# Patient Record
Sex: Female | Born: 1935 | Race: White | Hispanic: No | Smoking: Former smoker
Health system: Southern US, Community
[De-identification: ages and names within clinical notes are randomized; demographics above are authoritative.]

## PROBLEM LIST (undated history)

## (undated) DIAGNOSIS — I4891 Unspecified atrial fibrillation: Principal | ICD-10-CM

## (undated) DIAGNOSIS — I2510 Atherosclerotic heart disease of native coronary artery without angina pectoris: Secondary | ICD-10-CM

## (undated) DIAGNOSIS — J449 Chronic obstructive pulmonary disease, unspecified: Secondary | ICD-10-CM

## (undated) DIAGNOSIS — I219 Acute myocardial infarction, unspecified: Secondary | ICD-10-CM

## (undated) DIAGNOSIS — E785 Hyperlipidemia, unspecified: Secondary | ICD-10-CM

## (undated) DIAGNOSIS — J189 Pneumonia, unspecified organism: Secondary | ICD-10-CM

## (undated) DIAGNOSIS — I10 Essential (primary) hypertension: Secondary | ICD-10-CM

## (undated) DIAGNOSIS — I480 Paroxysmal atrial fibrillation: Secondary | ICD-10-CM

## (undated) DIAGNOSIS — F419 Anxiety disorder, unspecified: Secondary | ICD-10-CM

## (undated) DIAGNOSIS — M1990 Unspecified osteoarthritis, unspecified site: Secondary | ICD-10-CM

## (undated) DIAGNOSIS — E119 Type 2 diabetes mellitus without complications: Secondary | ICD-10-CM

## (undated) DIAGNOSIS — I739 Peripheral vascular disease, unspecified: Secondary | ICD-10-CM

## (undated) HISTORY — DX: Hyperlipidemia, unspecified: E78.5

## (undated) HISTORY — PX: CHOLECYSTECTOMY: SHX55

## (undated) HISTORY — DX: Peripheral vascular disease, unspecified: I73.9

## (undated) HISTORY — DX: Paroxysmal atrial fibrillation: I48.0

## (undated) HISTORY — PX: NASAL SINUS SURGERY: SHX719

## (undated) HISTORY — PX: OTHER SURGICAL HISTORY: SHX169

## (undated) HISTORY — PX: EYE SURGERY: SHX253

---

## 1985-08-06 HISTORY — PX: CORONARY ANGIOPLASTY: SHX604

## 1996-11-11 HISTORY — PX: ATRIAL FLUTTER ABLATION: SHX5733

## 1998-03-22 ENCOUNTER — Inpatient Hospital Stay (HOSPITAL_COMMUNITY): Admission: RE | Admit: 1998-03-22 | Discharge: 1998-03-30 | Payer: Self-pay | Admitting: Cardiovascular Disease

## 1998-03-22 HISTORY — PX: CARDIAC CATHETERIZATION: SHX172

## 1998-03-23 HISTORY — PX: CORONARY ARTERY BYPASS GRAFT: SHX141

## 1998-03-25 ENCOUNTER — Encounter: Payer: Self-pay | Admitting: Surgery

## 1998-03-26 ENCOUNTER — Encounter: Payer: Self-pay | Admitting: Surgery

## 1998-09-28 ENCOUNTER — Other Ambulatory Visit: Admission: RE | Admit: 1998-09-28 | Discharge: 1998-09-28 | Payer: Self-pay | Admitting: Internal Medicine

## 2000-06-03 ENCOUNTER — Other Ambulatory Visit: Admission: RE | Admit: 2000-06-03 | Discharge: 2000-06-03 | Payer: Self-pay | Admitting: Internal Medicine

## 2000-09-06 ENCOUNTER — Encounter: Payer: Self-pay | Admitting: Cardiovascular Disease

## 2000-09-06 ENCOUNTER — Inpatient Hospital Stay (HOSPITAL_COMMUNITY): Admission: EM | Admit: 2000-09-06 | Discharge: 2000-09-07 | Payer: Self-pay | Admitting: *Deleted

## 2003-07-23 ENCOUNTER — Encounter: Admission: RE | Admit: 2003-07-23 | Discharge: 2003-07-23 | Payer: Self-pay | Admitting: Internal Medicine

## 2004-03-21 ENCOUNTER — Ambulatory Visit (HOSPITAL_COMMUNITY): Admission: RE | Admit: 2004-03-21 | Discharge: 2004-03-21 | Payer: Self-pay | Admitting: Cardiovascular Disease

## 2004-03-21 DIAGNOSIS — I739 Peripheral vascular disease, unspecified: Secondary | ICD-10-CM

## 2004-03-21 HISTORY — DX: Peripheral vascular disease, unspecified: I73.9

## 2007-09-15 DIAGNOSIS — I739 Peripheral vascular disease, unspecified: Secondary | ICD-10-CM

## 2007-09-15 HISTORY — DX: Peripheral vascular disease, unspecified: I73.9

## 2008-10-28 ENCOUNTER — Encounter (INDEPENDENT_AMBULATORY_CARE_PROVIDER_SITE_OTHER): Payer: Self-pay | Admitting: Otolaryngology

## 2008-10-28 ENCOUNTER — Ambulatory Visit (HOSPITAL_COMMUNITY): Admission: RE | Admit: 2008-10-28 | Discharge: 2008-10-29 | Payer: Self-pay | Admitting: Otolaryngology

## 2009-06-16 HISTORY — PX: DOPPLER ECHOCARDIOGRAPHY: SHX263

## 2009-08-06 ENCOUNTER — Emergency Department (HOSPITAL_BASED_OUTPATIENT_CLINIC_OR_DEPARTMENT_OTHER): Admission: EM | Admit: 2009-08-06 | Discharge: 2009-08-07 | Payer: Self-pay | Admitting: Emergency Medicine

## 2009-08-07 ENCOUNTER — Ambulatory Visit: Payer: Self-pay | Admitting: Diagnostic Radiology

## 2009-08-07 ENCOUNTER — Encounter (INDEPENDENT_AMBULATORY_CARE_PROVIDER_SITE_OTHER): Payer: Self-pay | Admitting: *Deleted

## 2009-08-19 ENCOUNTER — Ambulatory Visit: Payer: Self-pay | Admitting: Obstetrics and Gynecology

## 2009-08-19 ENCOUNTER — Other Ambulatory Visit
Admission: RE | Admit: 2009-08-19 | Discharge: 2009-08-19 | Payer: Self-pay | Source: Home / Self Care | Admitting: Obstetrics and Gynecology

## 2009-09-19 ENCOUNTER — Encounter: Admission: RE | Admit: 2009-09-19 | Discharge: 2009-09-19 | Payer: Self-pay | Admitting: Internal Medicine

## 2009-09-19 ENCOUNTER — Encounter (INDEPENDENT_AMBULATORY_CARE_PROVIDER_SITE_OTHER): Payer: Self-pay | Admitting: *Deleted

## 2009-09-30 ENCOUNTER — Encounter (INDEPENDENT_AMBULATORY_CARE_PROVIDER_SITE_OTHER): Payer: Self-pay | Admitting: *Deleted

## 2009-09-30 ENCOUNTER — Encounter: Admission: RE | Admit: 2009-09-30 | Discharge: 2009-09-30 | Payer: Self-pay | Admitting: Internal Medicine

## 2009-11-04 DIAGNOSIS — I10 Essential (primary) hypertension: Secondary | ICD-10-CM

## 2009-11-04 HISTORY — DX: Essential (primary) hypertension: I10

## 2009-11-04 HISTORY — PX: OTHER SURGICAL HISTORY: SHX169

## 2009-11-23 ENCOUNTER — Ambulatory Visit: Payer: Self-pay | Admitting: Obstetrics and Gynecology

## 2010-02-10 ENCOUNTER — Encounter (INDEPENDENT_AMBULATORY_CARE_PROVIDER_SITE_OTHER): Payer: Self-pay | Admitting: *Deleted

## 2010-02-14 ENCOUNTER — Encounter (INDEPENDENT_AMBULATORY_CARE_PROVIDER_SITE_OTHER): Payer: Self-pay | Admitting: *Deleted

## 2010-02-15 ENCOUNTER — Encounter: Admission: RE | Admit: 2010-02-15 | Discharge: 2010-02-15 | Payer: Self-pay | Admitting: Family Medicine

## 2010-02-16 ENCOUNTER — Encounter (INDEPENDENT_AMBULATORY_CARE_PROVIDER_SITE_OTHER): Payer: Self-pay | Admitting: *Deleted

## 2010-02-17 ENCOUNTER — Encounter (INDEPENDENT_AMBULATORY_CARE_PROVIDER_SITE_OTHER): Payer: Self-pay | Admitting: *Deleted

## 2010-02-17 DIAGNOSIS — R1013 Epigastric pain: Secondary | ICD-10-CM

## 2010-02-17 DIAGNOSIS — N83209 Unspecified ovarian cyst, unspecified side: Secondary | ICD-10-CM

## 2010-02-17 DIAGNOSIS — J4489 Other specified chronic obstructive pulmonary disease: Secondary | ICD-10-CM | POA: Insufficient documentation

## 2010-02-17 DIAGNOSIS — F329 Major depressive disorder, single episode, unspecified: Secondary | ICD-10-CM

## 2010-02-17 DIAGNOSIS — I10 Essential (primary) hypertension: Secondary | ICD-10-CM | POA: Insufficient documentation

## 2010-02-17 DIAGNOSIS — E119 Type 2 diabetes mellitus without complications: Secondary | ICD-10-CM

## 2010-02-17 DIAGNOSIS — Z8639 Personal history of other endocrine, nutritional and metabolic disease: Secondary | ICD-10-CM

## 2010-02-17 DIAGNOSIS — E278 Other specified disorders of adrenal gland: Secondary | ICD-10-CM | POA: Insufficient documentation

## 2010-02-17 DIAGNOSIS — J449 Chronic obstructive pulmonary disease, unspecified: Secondary | ICD-10-CM

## 2010-02-17 DIAGNOSIS — F3289 Other specified depressive episodes: Secondary | ICD-10-CM | POA: Insufficient documentation

## 2010-02-17 DIAGNOSIS — Z862 Personal history of diseases of the blood and blood-forming organs and certain disorders involving the immune mechanism: Secondary | ICD-10-CM

## 2010-02-17 DIAGNOSIS — F411 Generalized anxiety disorder: Secondary | ICD-10-CM | POA: Insufficient documentation

## 2010-02-20 ENCOUNTER — Ambulatory Visit: Payer: Self-pay | Admitting: Internal Medicine

## 2010-02-20 DIAGNOSIS — M129 Arthropathy, unspecified: Secondary | ICD-10-CM | POA: Insufficient documentation

## 2010-02-20 DIAGNOSIS — N390 Urinary tract infection, site not specified: Secondary | ICD-10-CM | POA: Insufficient documentation

## 2010-02-21 LAB — CONVERTED CEMR LAB
ALT: 71 units/L — ABNORMAL HIGH (ref 0–35)
Albumin: 3.7 g/dL (ref 3.5–5.2)
Amylase: 112 units/L (ref 27–131)
Bilirubin, Direct: 0.2 mg/dL (ref 0.0–0.3)
Lipase: 63 units/L — ABNORMAL HIGH (ref 11.0–59.0)

## 2010-02-27 ENCOUNTER — Encounter: Payer: Self-pay | Admitting: Internal Medicine

## 2010-03-13 ENCOUNTER — Encounter: Payer: Self-pay | Admitting: Internal Medicine

## 2010-03-29 ENCOUNTER — Ambulatory Visit: Payer: Self-pay | Admitting: Obstetrics and Gynecology

## 2010-04-20 IMAGING — CR DG CHEST 2V
2 series · 2 of 2 positions shown · non-contrast
Comparison: None

CLINICAL DATA: Pre admit for chronic sinusitis

CHEST - 2 VIEW

[view not recorded (1 of 2)]
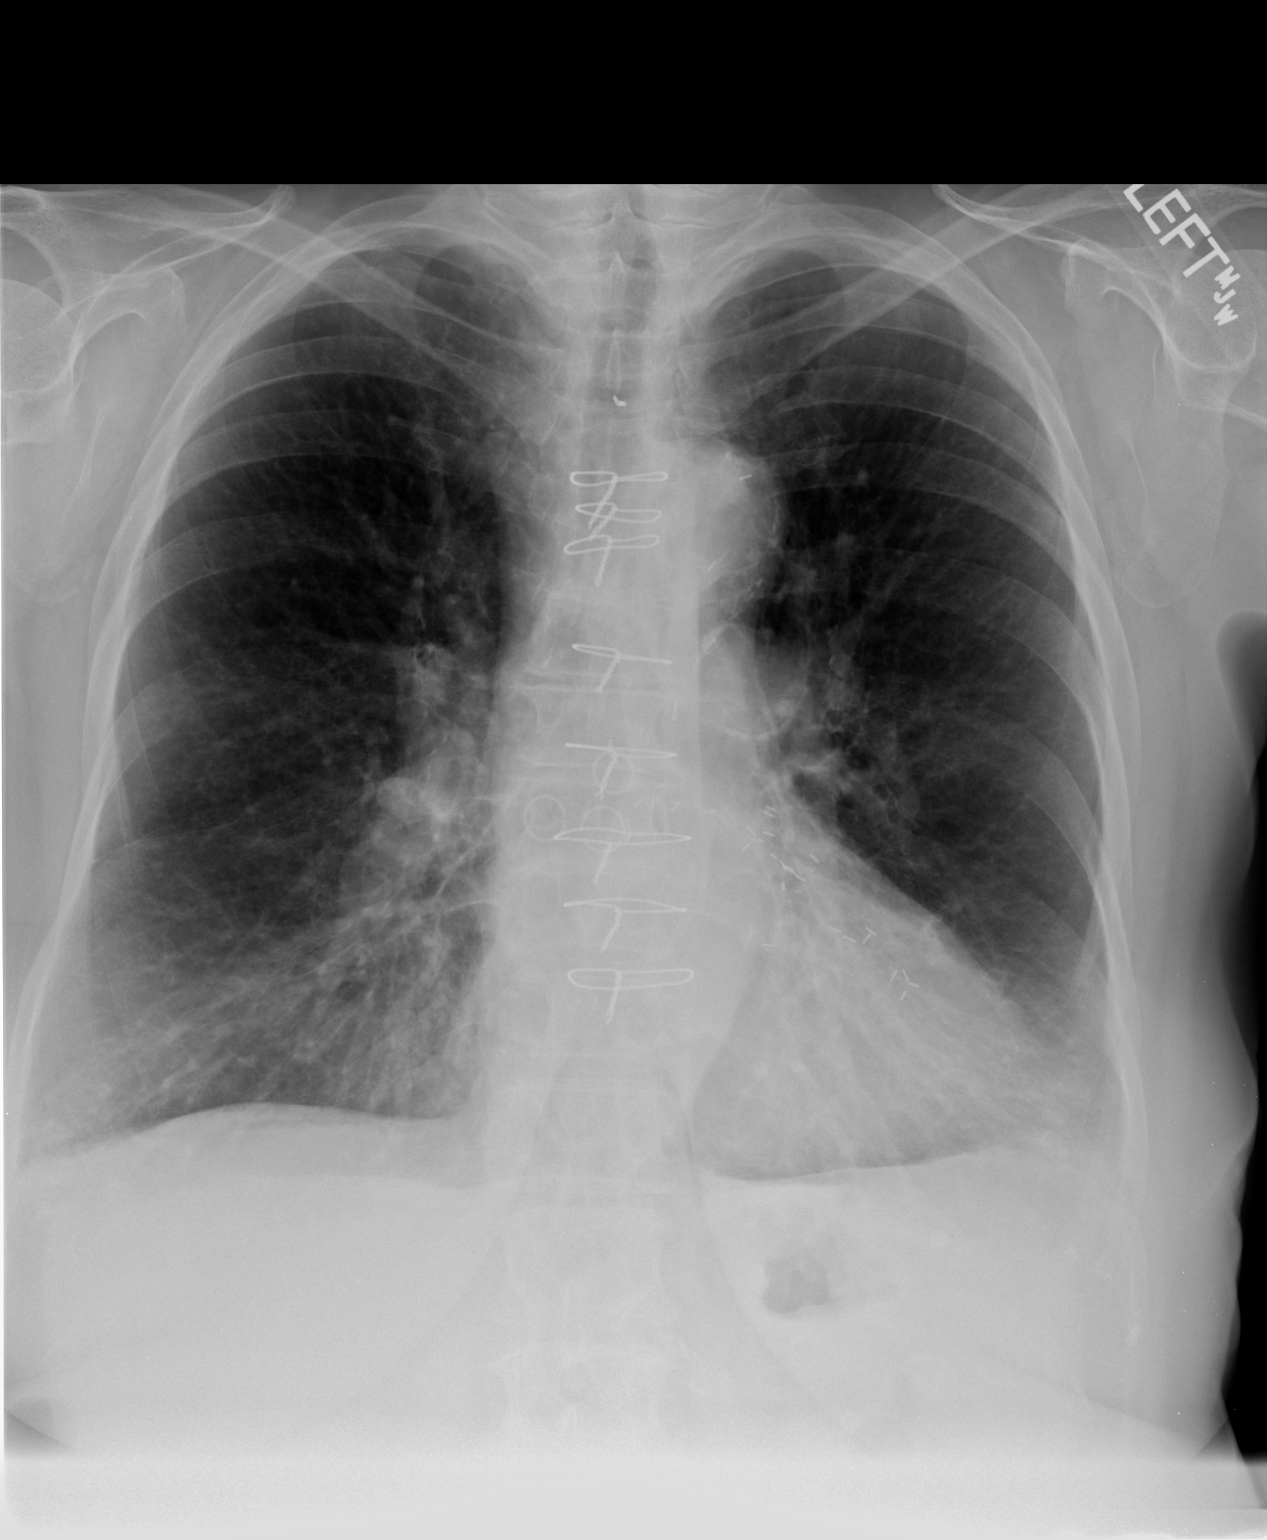

[view not recorded (2 of 2)]
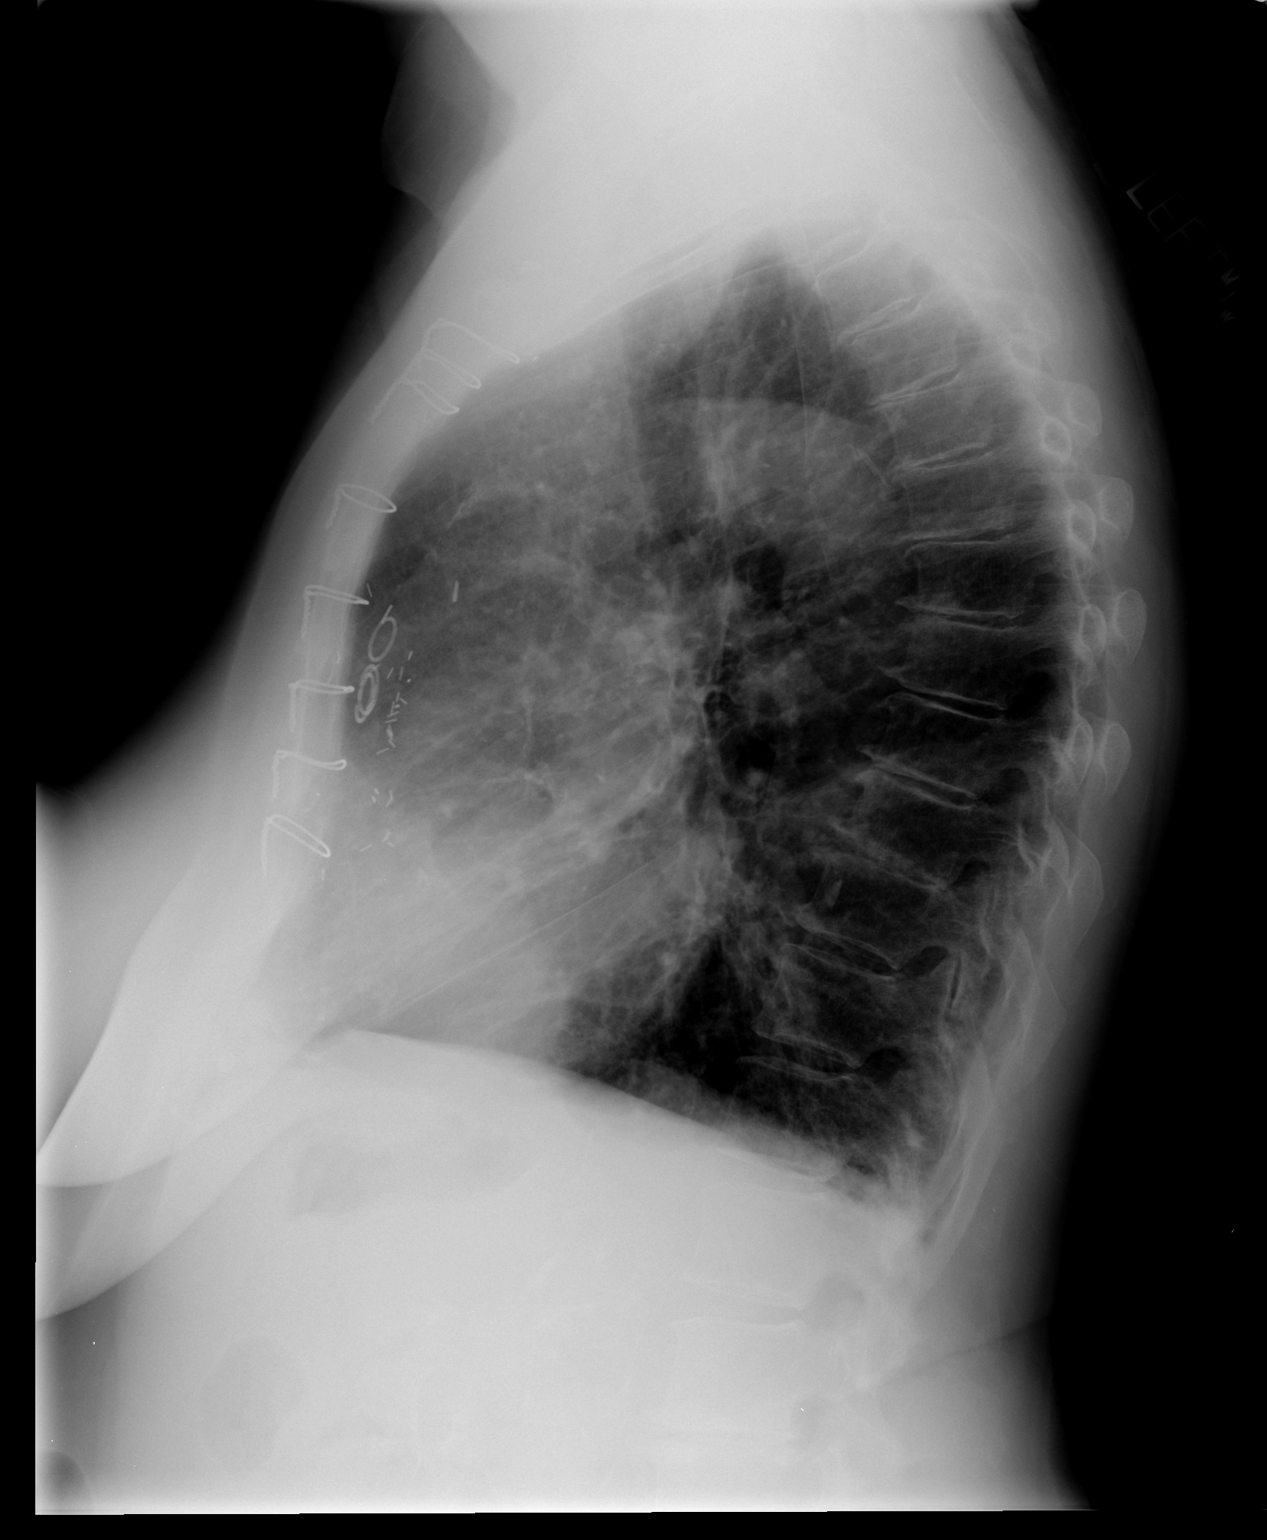

[2 of 2 positions shown; findings below may reference images not displayed]

FINDINGS: Post CABG.  Heart size borderline enlarged.  COPD with
changes of chronic bronchitis noted.  No active airspace disease.
IMPRESSION: 1.  Post CABG - borderline cardiomegaly without congestive heart
failure.
2.  Changes of chronic bronchitis and emphysema noted - no acute
process.

## 2010-04-27 ENCOUNTER — Ambulatory Visit (HOSPITAL_COMMUNITY)
Admission: RE | Admit: 2010-04-27 | Discharge: 2010-04-28 | Payer: Self-pay | Source: Home / Self Care | Admitting: General Surgery

## 2010-04-27 ENCOUNTER — Encounter (INDEPENDENT_AMBULATORY_CARE_PROVIDER_SITE_OTHER): Payer: Self-pay | Admitting: General Surgery

## 2010-09-07 NOTE — Assessment & Plan Note (Signed)
Summary: ?BILIARY COLIC, ABN. LFT'S/DN   History of Present Illness Visit Type: new patient  Primary GI MD: Lina Sar MD Primary Provider: Levander Campion, MD  Requesting Provider: na Chief Complaint: Upper abd pain, belching, constiaption, nausea, and vomiting  History of Present Illness:   This is an 77 year old white female with 3 discrete episodes of abdominal pain since March of this year. All episodes occurred in the middle of the night and were localized to the epigastrium and upper abdomen. An upper abdominal ultrasound showed sludge in the gallbladder and an upper GI series was normal. Her second episode on 10/16/09 also occurred at night and was associated with nausea but no fever or jaundice. The third episode last week was severe  and also occurred at night. She had the nausea but no fever or jaundice. Her upper abdominal ultrasound again  showed sludge in the gallbladder with a normal common bile duct of 5.2 mm. She has known  left ovarian cyst 5.2 cm in size  seen on a CT scan of the pelvis in January 2011. Her liver function tests last week were elevated with a bilirubin of 0.8, alkaline phosphatase of 176, AST of 79, ALT of  319 and lipase of 171. Patient described extensive itching all over her body at the time of the attack. She has a history of paroxysmal atrial fibrillation and is followed by Dr. Tresa Endo. She has been on Coumadin. She is a prediabetic on no medications. There is no known family history of gallbladder disease.   GI Review of Systems    Reports abdominal pain, belching, bloating, nausea, and  vomiting.     Location of  Abdominal pain: upper abdomen.    Denies acid reflux, chest pain, dysphagia with liquids, dysphagia with solids, heartburn, loss of appetite, vomiting blood, weight loss, and  weight gain.      Reports constipation.     Denies anal fissure, black tarry stools, change in bowel habit, diarrhea, diverticulosis, fecal incontinence, heme positive stool,  hemorrhoids, irritable bowel syndrome, jaundice, light color stool, liver problems, rectal bleeding, and  rectal pain.    Current Medications (verified): 1)  Lipitor 80 Mg Tabs (Atorvastatin Calcium) .... Take 1 Tablet By Mouth Once Daily 2)  Phenytoin Sodium Extended 100 Mg Caps (Phenytoin Sodium Extended) .... Take 1 Capsulr By Mouth in The Morning and 2 Capsules By Mouth At Bedtime 3)  Warfarin Sodium 5 Mg Tabs (Warfarin Sodium) .... Take As Directed 4)  Alprazolam 0.25 Mg Tabs (Alprazolam) .... Take 1/2 Tablet By Mouth Once Daily As Needed 5)  Toprol Xl 100 Mg Xr24h-Tab (Metoprolol Succinate) .... Take 1 Tablet By Mouth Once A Day 6)  Ramipril 10 Mg Caps (Ramipril) .... Take 1 Tablet By Mouth Once A Day 7)  Nexium 40 Mg Cpdr (Esomeprazole Magnesium) .... Take 1 Tablet By Mouth Once A Day 8)  Pletal 100 Mg Tabs (Cilostazol) .... Take 1 Tablet By Mouth Two Times A Day 9)  Diltiazem Hcl Cr 180 Mg Xr24h-Cap (Diltiazem Hcl) .... Take 1 Tablet By Mouth Once Daily 10)  Xyzal 5 Mg Tabs (Levocetirizine Dihydrochloride) .... Take 1 Tablet By Mouth Once Daily 11)  Calcium Carbonate 600 Mg Tabs (Calcium Carbonate) .... Take 1 Tablet By Mouth Two Times A Day 12)  Fish Oil 1000 Mg Caps (Omega-3 Fatty Acids) .... Take 1 Capsule By Mouth Three Times A Day 13)  Zetia 10 Mg Tabs (Ezetimibe) .... Take 1 Tablet By Mouth Once A Day 14)  Fluticasone (  Uknown Dosage) .... 2 Sprays Each Nostril Once Daily 15)  Hydrocodone-Acetaminophen 5-500 Mg Tabs (Hydrocodone-Acetaminophen) .... Take As Needed For Pain 16)  Hydrochlorothiazide 25 Mg Tabs (Hydrochlorothiazide) .... Take 1 Tablet By Mouth Once A Day 17)  Singulair 10 Mg Tabs (Montelukast Sodium) .... Take 1 Tablet By Mouth Once A Day 18)  Isosorbide Mononitrate Cr 30 Mg Xr24h-Tab (Isosorbide Mononitrate) .... Take 1 Tablet By Mouth Once Daily 19)  Tylenol Extra Strength 500 Mg Tabs (Acetaminophen) .... Take As Needed For Pain....usually Takes Up To 4-6 Tablets Per  Day Maximum 20)  Metamucil 30.9 % Powd (Psyllium) .... Use As Directed 21)  Miralax  Powd (Polyethylene Glycol 3350) .... Take As Needed For Constipation 22)  Spiriva Handihaler 18 Mcg Caps (Tiotropium Bromide Monohydrate) .... Take 1 Puff Daily 23)  Digoxin 0.125 Mg Tabs (Digoxin) .... Take 1 Tablet By Mouth Once Daily 24)  Amiodarone Hcl 200 Mg Tabs (Amiodarone Hcl) .... Take 1 To 1.5 Tablets By Mouth Once Daily 25)  Tekturna 150 Mg Tabs (Aliskiren Fumarate) .... Take 1 Tablet By Mouth Once A Day  Allergies (verified): 1)  ! Procardia  Past History:  Past Medical History: Reviewed history from 02/17/2010 and no changes required. Current Problems:  DEPRESSION (ICD-311) ANXIETY (ICD-300.00) COPD (ICD-496) LIVER FUNCTION TESTS, ABNORMAL, HX OF (ICD-V12.2) OVARIAN CYST (ICD-620.2) ADRENAL MASS, LEFT (ICD-255.8) DIABETES MELLITUS (ICD-250.00) HYPERTENSION (ICD-401.9) ABDOMINAL PAIN, EPIGASTRIC (ICD-789.06)    Past Surgical History: Heart Bypass--1999 Angioplasty---1989  Family History: Family History of Heart Disease: Father  Family History of Colon Cancer: ? MGF  Social History: Retired Widowed Three childern Patient is a former smoker.  Alcohol Use - no Daily Caffeine Use: one cup daily  Illicit Drug Use - no Smoking Status:  quit Drug Use:  no  Review of Systems       The patient complains of allergy/sinus, arthritis/joint pain, hearing problems, heart murmur, itching, sleeping problems, swelling of feet/legs, and urination changes/pain.  The patient denies anemia, anxiety-new, back pain, blood in urine, breast changes/lumps, change in vision, confusion, cough, coughing up blood, depression-new, fainting, fatigue, fever, headaches-new, heart rhythm changes, menstrual pain, muscle pains/cramps, night sweats, nosebleeds, pregnancy symptoms, shortness of breath, skin rash, sore throat, swollen lymph glands, thirst - excessive , urination - excessive , urine leakage,  vision changes, and voice change.         Pertinent positive and negative review of systems were noted in the above HPI. All other ROS was otherwise negative.   Vital Signs:  Patient profile:   77 year old female Height:      62 inches Weight:      142 pounds BMI:     26.07 BSA:     1.65 Pulse rate:   72 / minute Pulse rhythm:   regular BP sitting:   124 / 68  (left arm) Cuff size:   regular  Vitals Entered By: Ok Anis CMA (February 20, 2010 10:25 AM)  Physical Exam  General:  alert, oriented and in no distress. Eyes:  nonicteric. Mouth:  No deformity or lesions, dentition normal. Neck:  Supple; no masses or thyromegaly. Lungs:  Clear throughout to auscultation. Heart:  Regular rate and rhythm; no murmurs, rubs,  or bruits. Abdomen:  soft abdomen with normal active bowel sounds. No bruit. Liver edge at costal margin. No particular tenderness in right upper quadrant. Lower abdomen unremarkable. Rectal:  showed Hemoccult negative stool. Mild tenderness on digital exam. Small external hemorrhoidal tags. Extremities:  No clubbing, cyanosis,  edema or deformities noted. Skin:  early palmar erythema. Psych:  Alert and cooperative. Normal mood and affect.   Impression & Recommendations:  Problem # 1:  ABDOMINAL PAIN, EPIGASTRIC (ICD-789.06) Patient has had 3 discrete episodes of epigastric abdominal pain which all occured at night and are consistent with biliary colic. She has been on Nexium 40 mg a day and denies any symptomatic gastroesophageal reflux. I believe her abdominal pain is biliary in origin.  Problem # 2:  LIVER FUNCTION TESTS, ABNORMAL, HX OF (ICD-V12.2) Patient has abnormal transaminases including abnormal alkaline phosphatase . Right upper quadrant abdominal pain suggestive of cholecystitis. I feel that the patient has had 3 discrete episodes of biliary colic with transient elevation of serum lipase. She does not have   clinical pancreatitis. We have had a long  discussion about biliary colic and I gave my recommendation that she have an elective cholecystectomy. She is on Coumadin and an emergency cholecystectomy would not be possible. She would have to be okayed by Dr. Tresa Endo as far as her cardiac status is concerned. At this time, we will repeat her liver function tests, amylase and lipase and set up a surgical appointment for her. I asked her to make an appointment with Dr. Tresa Endo for clearance. Orders: TLB-Hepatic/Liver Function Pnl (80076-HEPATIC) TLB-Amylase (82150-AMYL) TLB-Lipase (83690-LIPASE)  Problem # 3:  OVARIAN CYST (ICD-620.2) Patient has a 5.2 cm left ovarian cyst which was seen on a CT scan in January 2011. I would consider diagnostic evaluation at the time of her laparoscopic cholecystectomy.  Other Orders: [facility])  Patient Instructions: 1)  repeat liver function tests, amylase and lipase today. 2)  Surgical referral for laparoscopic cholecystectomy. 3)  Cardiology clearance by Dr. Tresa Endo. 4)  Notify Dr.Gottsegan about patient having surgery  to see if  he may want to address the issue of left ovarian cyst. 5)  Stay on low-fat diet. 6)  Take Vicodin p.r.n. abdominal pain. 7)  Copy sent to : Dr Luz Brazen, Dr Bishop Limbo, Dr D.Gottsegan, Dr Abbey Chatters 8)  The medication list was reviewed and reconciled.  All changed / newly prescribed medications were explained.  A complete medication list was provided to the patient / caregiver.

## 2010-09-07 NOTE — Progress Notes (Signed)
Summary: Dr Luz Brazen  Office Visit   Imported By: Harlow Mares CMA (AAMA) 02/17/2010 14:53:05  _____________________________________________________________________  External Attachment:    Type:   Image     Comment:   External Document

## 2010-09-07 NOTE — Consult Note (Signed)
Summary: [facility]   Imported By: Sherian Rein 04/04/2010 09:13:19  _____________________________________________________________________  External Attachment:    Type:   Image     Comment:   External Document

## 2010-09-14 DIAGNOSIS — I739 Peripheral vascular disease, unspecified: Secondary | ICD-10-CM

## 2010-09-14 HISTORY — DX: Peripheral vascular disease, unspecified: I73.9

## 2010-10-04 ENCOUNTER — Other Ambulatory Visit: Payer: Medicare Other

## 2010-10-04 ENCOUNTER — Ambulatory Visit (INDEPENDENT_AMBULATORY_CARE_PROVIDER_SITE_OTHER): Payer: Medicare Other | Admitting: Obstetrics and Gynecology

## 2010-10-04 DIAGNOSIS — N859 Noninflammatory disorder of uterus, unspecified: Secondary | ICD-10-CM

## 2010-10-04 DIAGNOSIS — N83209 Unspecified ovarian cyst, unspecified side: Secondary | ICD-10-CM

## 2010-10-19 LAB — CBC
HCT: 41.2 % (ref 36.0–46.0)
MCH: 31.2 pg (ref 26.0–34.0)
MCV: 91.2 fL (ref 78.0–100.0)
Platelets: 273 10*3/uL (ref 150–400)
RDW: 12.6 % (ref 11.5–15.5)
WBC: 10.4 10*3/uL (ref 4.0–10.5)

## 2010-10-19 LAB — COMPREHENSIVE METABOLIC PANEL
ALT: 30 U/L (ref 0–35)
AST: 28 U/L (ref 0–37)
Calcium: 9.1 mg/dL (ref 8.4–10.5)
Chloride: 100 mEq/L (ref 96–112)
Creatinine, Ser: 0.91 mg/dL (ref 0.4–1.2)
Glucose, Bld: 105 mg/dL — ABNORMAL HIGH (ref 70–99)
Sodium: 140 mEq/L (ref 135–145)
Total Protein: 7.3 g/dL (ref 6.0–8.3)

## 2010-10-19 LAB — DIFFERENTIAL
Basophils Absolute: 0 10*3/uL (ref 0.0–0.1)
Basophils Relative: 0 % (ref 0–1)
Eosinophils Absolute: 0.1 10*3/uL (ref 0.0–0.7)
Eosinophils Relative: 1 % (ref 0–5)
Lymphocytes Relative: 20 % (ref 12–46)
Lymphs Abs: 2.1 10*3/uL (ref 0.7–4.0)
Monocytes Absolute: 1.1 10*3/uL — ABNORMAL HIGH (ref 0.1–1.0)
Neutro Abs: 7.2 10*3/uL (ref 1.7–7.7)

## 2010-10-19 LAB — PROTIME-INR: Prothrombin Time: 15.4 seconds — ABNORMAL HIGH (ref 11.6–15.2)

## 2010-10-19 LAB — GLUCOSE, CAPILLARY: Glucose-Capillary: 157 mg/dL — ABNORMAL HIGH (ref 70–99)

## 2010-10-19 LAB — APTT: aPTT: 30 seconds (ref 24–37)

## 2010-10-19 LAB — SURGICAL PCR SCREEN: Staphylococcus aureus: NEGATIVE

## 2010-10-22 LAB — URINE CULTURE
Colony Count: NO GROWTH
Culture: NO GROWTH

## 2010-10-22 LAB — CBC
HCT: 39.8 % (ref 36.0–46.0)
Hemoglobin: 13.6 g/dL (ref 12.0–15.0)
MCHC: 34.1 g/dL (ref 30.0–36.0)
MCV: 91.5 fL (ref 78.0–100.0)
Platelets: 267 10*3/uL (ref 150–400)
RBC: 4.35 MIL/uL (ref 3.87–5.11)
RDW: 12.3 % (ref 11.5–15.5)
WBC: 8.8 10*3/uL (ref 4.0–10.5)

## 2010-10-22 LAB — COMPREHENSIVE METABOLIC PANEL
ALT: 18 U/L (ref 0–35)
AST: 19 U/L (ref 0–37)
Albumin: 4 g/dL (ref 3.5–5.2)
Alkaline Phosphatase: 135 U/L — ABNORMAL HIGH (ref 39–117)
CO2: 28 mEq/L (ref 19–32)
Calcium: 8.7 mg/dL (ref 8.4–10.5)
Creatinine, Ser: 0.7 mg/dL (ref 0.4–1.2)
GFR calc Af Amer: >60 mL/min (ref >60)
Sodium: 140 mEq/L (ref 135–145)
Total Bilirubin: 0.6 mg/dL (ref 0.3–1.2)

## 2010-10-22 LAB — URINALYSIS, ROUTINE W REFLEX MICROSCOPIC
Bilirubin Urine: NEGATIVE
Hgb urine dipstick: NEGATIVE
Ketones, ur: NEGATIVE mg/dL
Nitrite: NEGATIVE
Protein, ur: NEGATIVE mg/dL
Specific Gravity, Urine: 1.008 (ref 1.005–1.030)
Urobilinogen, UA: 0.2 mg/dL (ref 0.0–1.0)

## 2010-10-22 LAB — LIPASE, BLOOD: Lipase: 184 U/L (ref 23–300)

## 2010-10-22 LAB — DIFFERENTIAL
Eosinophils Absolute: 0.1 10*3/uL (ref 0.0–0.7)
Monocytes Absolute: 1.1 10*3/uL — ABNORMAL HIGH (ref 0.1–1.0)
Monocytes Relative: 13 % — ABNORMAL HIGH (ref 3–12)

## 2010-11-09 HISTORY — PX: OTHER SURGICAL HISTORY: SHX169

## 2010-11-16 LAB — BASIC METABOLIC PANEL
BUN: 16 mg/dL (ref 6–23)
Calcium: 9.2 mg/dL (ref 8.4–10.5)
GFR calc non Af Amer: 52 mL/min — ABNORMAL LOW (ref >60)
Glucose, Bld: 117 mg/dL — ABNORMAL HIGH (ref 70–99)

## 2010-11-16 LAB — CBC
HCT: 43.8 % (ref 36.0–46.0)
Platelets: 278 10*3/uL (ref 150–400)
RDW: 13.1 % (ref 11.5–15.5)

## 2010-11-16 LAB — GLUCOSE, CAPILLARY: Glucose-Capillary: 128 mg/dL — ABNORMAL HIGH (ref 70–99)

## 2010-11-16 LAB — APTT: aPTT: 31 seconds (ref 24–37)

## 2010-12-19 NOTE — Op Note (Signed)
NAMEMORIYA, MITCHELL               ACCOUNT NO.:  [id number]   MEDICAL RECORD NO.:  [id number]          PATIENT TYPE:  OIB   LOCATION:  5120                         FACILITY:  [facility]   PHYSICIAN:  Suzanna Obey, M.D.       DATE OF BIRTH:  [birth year]   DATE OF PROCEDURE:  10/28/2008  DATE OF DISCHARGE:                               OPERATIVE REPORT   PREOPERATIVE DIAGNOSIS:  Chronic sinusitis and eustachian tube  dysfunction.   POSTOPERATIVE DIAGNOSIS:  Chronic sinusitis and eustachian tube  dysfunction.   SURGICAL PROCEDURE:  Bilateral maxillary antrostomy, bilateral  ethmoidectomy, bilateral frontal sinusotomy, bilateral sphenoidotomy,  and Stealth computer guidance, and bilateral myringotomy and tubes.   ANESTHESIA:  General.   ESTIMATED BLOOD LOSS:  Approximately 50 mL.   INDICATIONS:  This is a 77 year old who has had significant nasal and  sinus congestion issues along with some ear issues with decreased  hearing and effusion.  She was informed the risks and benefits of the  endoscopic sinus surgery and bilateral myringotomy and tubes.  Risks and  benefits were discussed as well as options.  All questions were answered  and consent was obtained.   OPERATION:  The patient was taken to the operating room and placed in  supine position.  After general endotracheal tube anesthesia was placed  in the left gaze position, cerumen was cleaned off external auditory  canal under otomicroscope direction.  There was significant amount of  cerumen in the canal.  Once removed, myringotomy made in the anterior-  inferior quadrant.  No effusion.  Sheehy tube placed.  Ciprodex was  instilled.  The left ear had no significant cerumen.  Myringotomy made  in the anterior-inferior quadrant.  Mucoid effusion suctioned.  Sheehy  tube placed.  Ciprodex was instilled.  The table was turned.  The  patient was placed in the supine position and the Stealth system was  positioned and calibrated with good  accuracy.  Afrin pledgets were  placed into the nose bilaterally in the inferior turbinate, and middle  turbinate, and injected 1% lidocaine with 1:100,000 epinephrine.  The  left side was begun opening up the uncinate process with the guidance  and the microdebrider.  It was retrograde using the hook to bring the  uncinate process out.  It was then removed.  The antrostomy was opened  widely and there was thick very pasty like purulence within the  maxillary sinus that was suctioned out with the large suction.  The  mucosa was very thick and within the maxillary sinus.  Ethmoid was then  opened and there was polypoid material and thickened debris throughout  the ethmoid cavity dissection from posterior to anterior, very thick  mucoid-like mucus.  Nasofrontal duct was opened.  The sphenoid was  opened.  The Stealth guidance system was used throughout this  dissection.  It was all nicely opened and Afrin pledget was placed, the  right side was repeated in the same fashion, again polyps, thick mucus  but the maxillary sinus did not have the thick pasty purulence within  the sinus.  The polyps  and thickened mucoid mucosa was throughout the  right sinus.  The pledgets again were placed.  All the sinus appeared to  be opened.  There was good hemostasis.  The Kennedy packs soaked in  Bactroban were placed into the nose bilaterally, and the tie was loosely  tied across the columella, and the nasopharynx suctioned out of all  blood and debris.  The patient was then awakened and brought to recovery  room in stable condition.  Counts correct.           ______________________________  Suzanna Obey, M.D.     JB/MEDQ  D:  10/28/2008  T:  10/29/2008  Job:  [id number]

## 2010-12-22 NOTE — Cardiovascular Report (Signed)
Cheyenne [name], Cheyenne [name]                         ACCOUNT NO.:  [id number]   MEDICAL RECORD NO.:  [id number]                   PATIENT TYPE:  OIB   LOCATION:  2857                                 FACILITY:  [facility]   PHYSICIAN:  Nanetta Batty, M.D.                DATE OF BIRTH:  Oct 22, 1924   DATE OF PROCEDURE:  03/21/2004  DATE OF DISCHARGE:  03/21/2004                              CARDIAC CATHETERIZATION   CLINICAL HISTORY:  Ms. Blacksher is a 77 year old female with history of CAD and  PVOD.  She also has PAF status post ablation, Coumadin anticoagulation.  Other problems include hypertension and hyperlipidemia.  She has had  coronary artery bypass grafting in the past.  The patient complains if  lifestyle limiting claudication with Dopplers that suggested occlusion of  the left SFA.  She presents now for angiography and potential intervention.   PROCEDURE:  The patient was brought to the sixth floor [facility] in the postabsorptive state.  She was  premedicated with p.o. Valium.  Her right groin was prepped and shaved in  the usual sterile fashion.  1% Xylocaine was used for local anesthesia.  A 5-  French sheath was inserted into the right femoral artery using standard  Seldinger technique.  A 5-French tennis racquet catheter was used for  midstream and distal abdominal aortography with bifemoral runoff.  _________  was performed over the tibial trifurcation.  Visipaque dye was used for the  entirety of the case.  Retrograde aortic pressure was monitored during the  case.  Pullback gradient was performed across the right common iliac artery  using a 5-French end-hole catheter.   ANGIOGRAPHIC RESULTS:  1. Abdominal aorta:     a. Renal arteries:  Normal.     b. Infrarenal abdominal aorta:  Moderate atherosclerotic changes.  2. Left lower extremity:     a. 70% smooth segmental distal left external iliac artery stenosis.     b. Totally occluded left  SFA with reconstitution _________ collaterals.     c. Three vessel runoff to the foot with diffuse disease in the tibial        vessels.  3. Right lower extremity:     a. 70% proximal right common iliac artery stenosis without a pullback        gradient.     b. 50-60% diffuse segmental proximal and mid right SFA disease.     c. One vessel runoff via the anterior tibial with diffuse disease in the        perineal and posterior tibial.   IMPRESSION:  Ms. Mazurek has total left SFA with three vessel runoff, though  her tibial vessels are diffusely diseased.  I think that given her age,  medical therapy would be optimal with Pletal and I would reserve surgical  revascularization for a critical limb ischemia.   Sheath was removed and pressure was held  on the groin to achieve hemostasis.  The patient left the laboratory in stable condition.  She will be discharged  home later today as an outpatient and will see me back in the office in one  to two weeks for follow-up.                                               Nanetta Batty, M.D.    JB/MEDQ  D:  03/21/2004  T:  03/22/2004  Job:  [id number]   cc:   [facility]   Nicki Guadalajara, M.D.  [address]  [place], [place] [address]  Fax: [phone]   Larina Earthly, M.D.  [address]  [place]  [place] [address]  Fax: [phone]

## 2010-12-22 NOTE — Discharge Summary (Signed)
[place]  Patient:    Cheyenne, [name]                         MRN: [id number] Adm. Date:  09/06/00 Disc. Date: 09/07/00 Attending:  Lennette Bihari, M.D. Dictator:   Halford Decamp. Cheyenne [name], R.N., N.P.                           Discharge Summary  HISTORY OF PRESENT ILLNESS/HOSPITAL COURSE:  [name] came into the emergency room with some atypical chest discomfort as compared to her usual angina. She started having discomfort between her shoulders blades about 9:30. She took a nitroglycerin, however, it was old and she had no change in the discomfort. She had no shortness of breath, no diaphoresis, no radiation; however, she felt some different feeling in her chest, vague sounding, no described as any pain or tightness.  She just did not feel right.  She occasionally felt like her heart was racing but she checked her pulse and it was 65, her blood pressure was 115/71 at home.  She called the office and she was told to come to the ER.  She denies any recent nausea or temperature elevation. She had cold symptoms approximately three weeks ago.  She has had no new medications recently. She did not do any increased strenuous activity. She does have chronic reflux symptoms; however, they are not any worse. However, she is not on any regular medications for this. She was seen by Runell Gess, M.D., in the ER and it was decided to admit her and rule out for an MI.  If she had no more discomfort the following day, she would be discharged home with an outpatient Cardiolite.  We did put her on Protonix in the emergency room.  We did not start her on heparin because she is on Coumadin and she was at a therapeutic level.  She does have a history of coronary artery bypass grafting in 1999, and proximal atrial fibrillation.  On September 07, 2000, she was feeling fine.  She wanted to go home. She had no further chest pain or interscapular pain.  She was seen by  Dr. Tresa Endo. We were concerned about her medications.  She is on high dose multiple medications, thus it was decided that we would stop her Cardizem, stop her Tricor because she does have renal insufficiency with a creatinine of 2.0.  She states her creatinine has been elevated in the past but it was rechecked by Daleen Bo R. Avva, M.D., and it returned back to normal.  Dr. Tresa Endo also recommended possibly trying to stop her digoxin if she did okay off her Cardizem without any recurrent proximal atrial fibrillation.  DISCHARGE MEDICATIONS:  1. Altace 5 mg once per day.  2. Lipitor 80 mg once per day.  3. Lanoxin 0.125 mg once per day.  4. Coumadin as taken previously.  5. Betapace 150 mg twice a day.  6. Dilantin 100 mg in the morning, 200 mg at bedtime.  7. Triamterene Monday through Friday.  8. Protonix 40 mg once per day.  9. Nitroglycerin under the tongue as needed for chest pain. 10. She is to stop her Tricor and Cardizem. 11. She may continue to take Diltiazem at a 30 mg dose p.r.n. if she has     palpitations.  She has had a prescription for this for a long time at  home.  She has not had to use any recently.  LABS:  Her CBC showed a hemoglobin of 14.9, hematocrit 42.9, wbc 8.4, platelets 309.  Her sodium was 137, potassium 4.0, BUN 33, creatinine 2.0, glucose 173.  CK-MB and troponins were negative x 2.  INR was 3.0, PTT was 65. TSH was 1.9.  Digoxin level was 0.5. She did have fasting lipid profile done, however, it was pending at the time of dictation.   DISCHARGE DIAGNOSES: 1. Unstable angina, no myocardial infarction. 2. History of coronary artery disease with ______ in 1997, treated with    percutaneous coronary intervention and status post coronary artery bypass    graft in 1999. 3. Possible atrial flutter ablation in April 1998. 4. Postoperative possible atrial fibrillation now on Betapace and Coumadin. 5. Hyperlipidemia mixed with high triglycerides and high LDL. 6.  Chronic obstructive pulmonary disease. 7. Trigeminal neuralgia treated with Dilantin. 8. Gastroesophageal reflux disease symptoms.  PLAN:  She will have a follow-up Persantine Cardiolite as an outpatient.  She will then follow up with Dr. Tresa Endo per her request. DD:  09/07/00 TD:  09/09/00 Job: [id number]

## 2011-04-16 HISTORY — PX: DOPPLER ECHOCARDIOGRAPHY: SHX263

## 2011-07-26 ENCOUNTER — Other Ambulatory Visit: Payer: Self-pay | Admitting: Obstetrics and Gynecology

## 2011-07-26 ENCOUNTER — Ambulatory Visit (INDEPENDENT_AMBULATORY_CARE_PROVIDER_SITE_OTHER): Payer: Medicare Other | Admitting: Obstetrics and Gynecology

## 2011-07-26 ENCOUNTER — Ambulatory Visit (INDEPENDENT_AMBULATORY_CARE_PROVIDER_SITE_OTHER): Payer: Medicare Other

## 2011-07-26 ENCOUNTER — Other Ambulatory Visit: Payer: PRIVATE HEALTH INSURANCE

## 2011-07-26 ENCOUNTER — Ambulatory Visit: Payer: PRIVATE HEALTH INSURANCE | Admitting: Obstetrics and Gynecology

## 2011-07-26 DIAGNOSIS — D3910 Neoplasm of uncertain behavior of unspecified ovary: Secondary | ICD-10-CM

## 2011-07-26 DIAGNOSIS — N83209 Unspecified ovarian cyst, unspecified side: Secondary | ICD-10-CM

## 2011-07-26 DIAGNOSIS — R1903 Right lower quadrant abdominal swelling, mass and lump: Secondary | ICD-10-CM

## 2011-07-26 DIAGNOSIS — N840 Polyp of corpus uteri: Secondary | ICD-10-CM

## 2011-07-26 DIAGNOSIS — R1904 Left lower quadrant abdominal swelling, mass and lump: Secondary | ICD-10-CM

## 2011-07-26 NOTE — Progress Notes (Signed)
Patient came back to see me today for further followup of bilateral adnexal masses and an endometrial polyp. These were first discovered several years ago when the patient had a CT scan. We went ahead and did an ultrasound today .her uterus is normal size and shape. Her endometrial cavity is fluid-filled and there is a defect in it of  5 x 5 mm. It is most consistent with a small endometrial polyp. It has been previously seen and has not increased in size. In her right adnexa there is a solid ovarian mass of 1.5 cm. It is avascular. It has remained stable. On her left ovary there is a echo-free coma shape cystic mass of 4.8 cm. It also is avascular. It is actually decreased in size from her last ultrasound. The patient has had no vaginal bleeding. She will occasionally have some vague lower abdominal discomfort which has not very severe.  Assessment: Bilateral adnexal masses. Small endometrial polyp  Plan: Patient and daughter reassured. Since we've been watching these  there has been no appreciable change. She knows I cannot guarantee that it's not malignant but it  is extremely unlikely. She is just slightly symptomatic. I told her I thought the risks of surgery were greater than the benefits. She agrees. We will do another ultrasound in 6 months.

## 2011-10-21 IMAGING — RF DG CHOLANGIOGRAM OPERATIVE
1 series · 4 of 4 positions shown · non-contrast
Comparison: CT of 08/07/2009

CLINICAL DATA: Laparoscopic cholecystectomy for biliary
pancreatitis.

INTRAOPERATIVE CHOLANGIOGRAM
TECHNIQUE: Cholangiographic images from the C-arm fluoroscopic
device were submitted for interpretation post-operatively.  Please
see the procedural report for the amount of contrast and the
fluoroscopy time utilized.

[Series 1: run · 4 of 82 frames shown]
[frame 13/82]
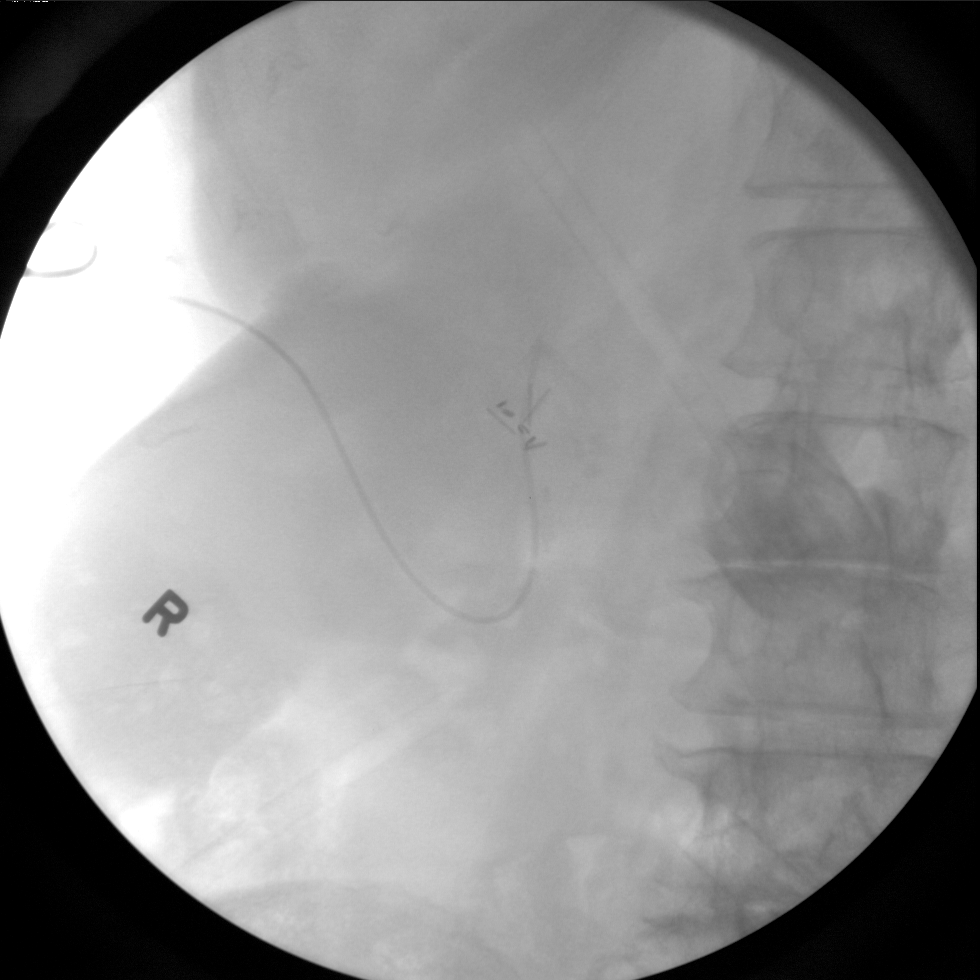
[frame 42/82]
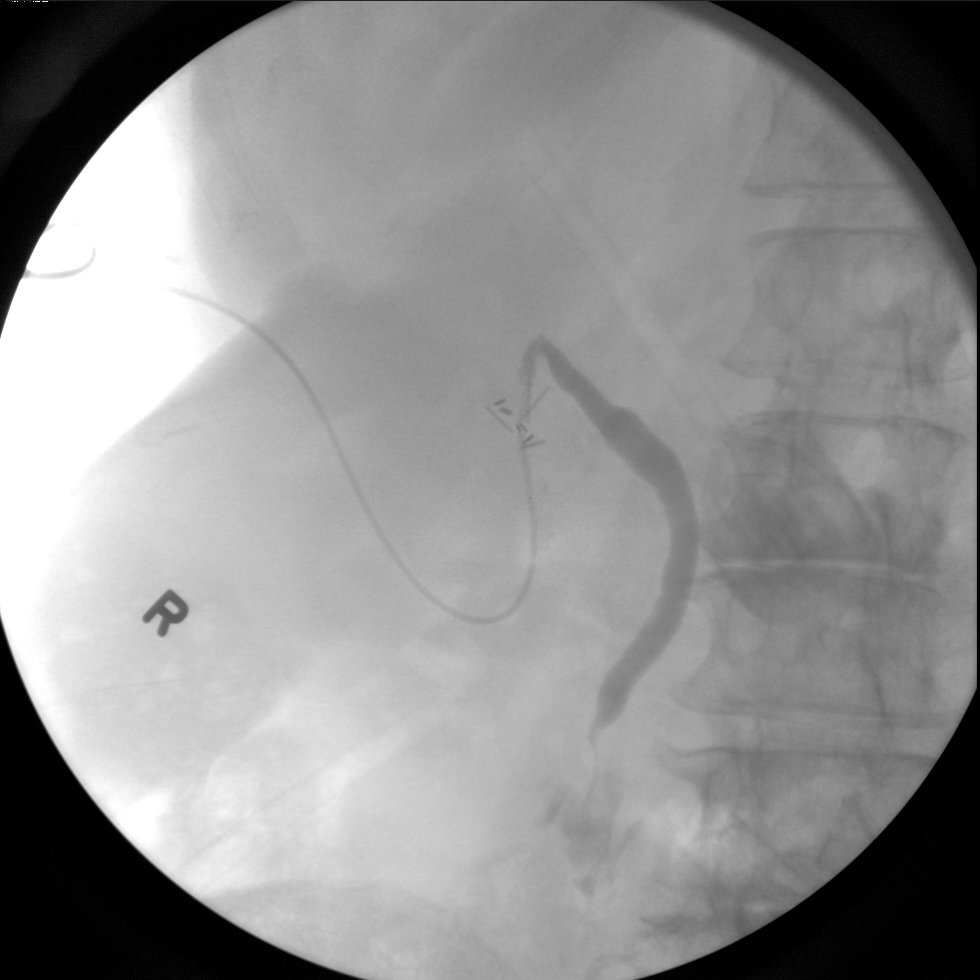
[frame 70/82]
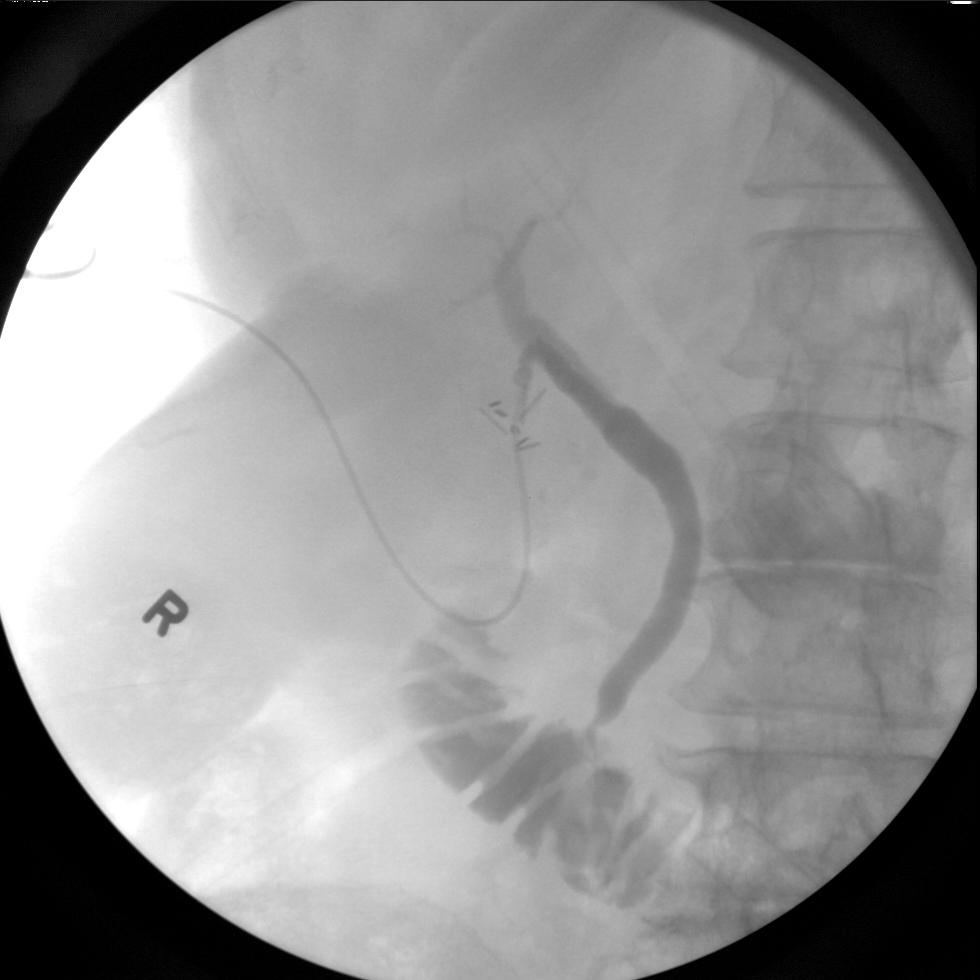
[frame 80/82]
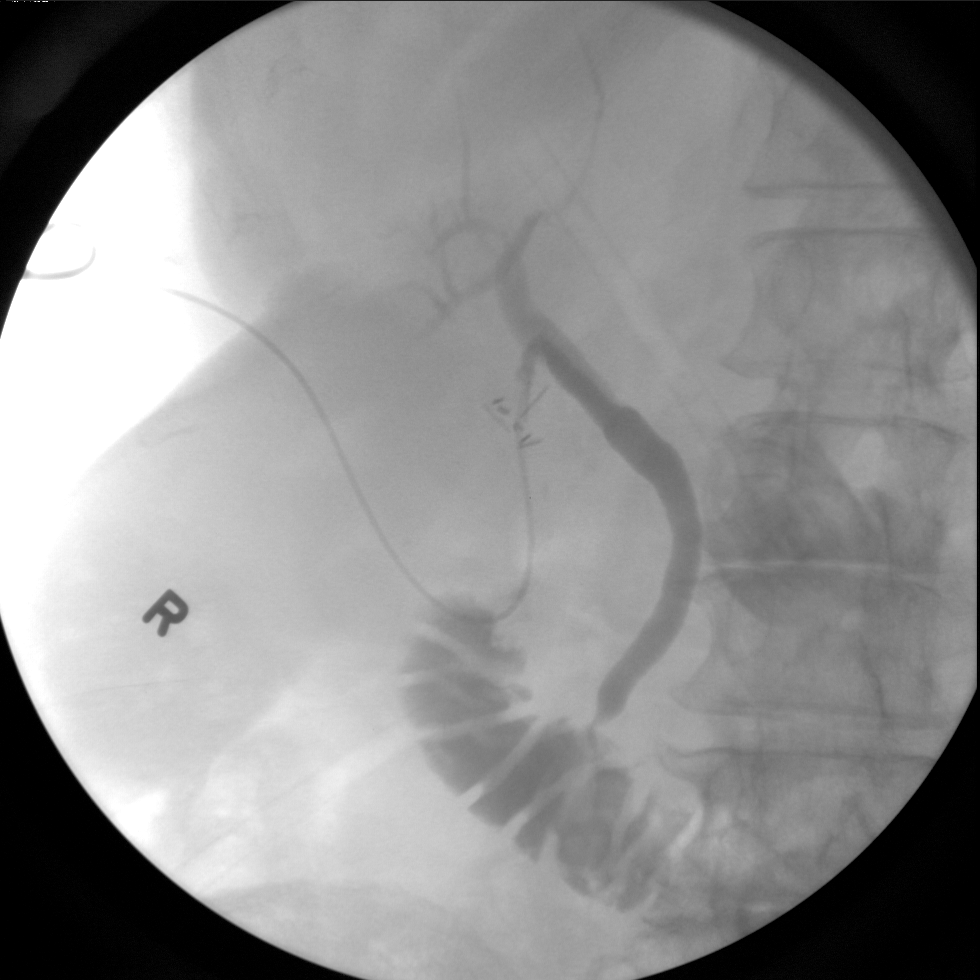

[4 of 4 positions shown; findings below may reference images not displayed]

FINDINGS: A single run with a total of 82 images submitted.  No
contrast extravasation to suggest biliary leak.  Normal caliber of
the intrahepatic ducts.  The common duct measures at the upper
limits of normal.  No persistent filling defect to suggest
choledocholithiasis.  Free spill of contrast into the descending
duodenum.
IMPRESSION: No evidence of biliary leak or common duct stone.

## 2012-02-24 ENCOUNTER — Inpatient Hospital Stay (HOSPITAL_COMMUNITY)
Admission: EM | Admit: 2012-02-24 | Discharge: 2012-02-25 | DRG: 310 | Disposition: A | Discharge status: Home / Self Care | Payer: Medicare Other | Attending: Cardiovascular Disease | Admitting: Cardiovascular Disease

## 2012-02-24 ENCOUNTER — Emergency Department (HOSPITAL_COMMUNITY): Payer: Medicare Other

## 2012-02-24 ENCOUNTER — Encounter (HOSPITAL_COMMUNITY): Payer: Self-pay | Admitting: Physical Medicine and Rehabilitation

## 2012-02-24 DIAGNOSIS — Z7901 Long term (current) use of anticoagulants: Secondary | ICD-10-CM

## 2012-02-24 DIAGNOSIS — E119 Type 2 diabetes mellitus without complications: Secondary | ICD-10-CM | POA: Diagnosis present

## 2012-02-24 DIAGNOSIS — J449 Chronic obstructive pulmonary disease, unspecified: Secondary | ICD-10-CM | POA: Diagnosis present

## 2012-02-24 DIAGNOSIS — E785 Hyperlipidemia, unspecified: Secondary | ICD-10-CM | POA: Diagnosis present

## 2012-02-24 DIAGNOSIS — F329 Major depressive disorder, single episode, unspecified: Secondary | ICD-10-CM | POA: Diagnosis present

## 2012-02-24 DIAGNOSIS — Z79899 Other long term (current) drug therapy: Secondary | ICD-10-CM

## 2012-02-24 DIAGNOSIS — Z888 Allergy status to other drugs, medicaments and biological substances status: Secondary | ICD-10-CM

## 2012-02-24 DIAGNOSIS — Z951 Presence of aortocoronary bypass graft: Secondary | ICD-10-CM

## 2012-02-24 DIAGNOSIS — E876 Hypokalemia: Secondary | ICD-10-CM

## 2012-02-24 DIAGNOSIS — I10 Essential (primary) hypertension: Secondary | ICD-10-CM | POA: Diagnosis present

## 2012-02-24 DIAGNOSIS — F411 Generalized anxiety disorder: Secondary | ICD-10-CM | POA: Diagnosis present

## 2012-02-24 DIAGNOSIS — I4891 Unspecified atrial fibrillation: Principal | ICD-10-CM | POA: Diagnosis present

## 2012-02-24 DIAGNOSIS — I2510 Atherosclerotic heart disease of native coronary artery without angina pectoris: Secondary | ICD-10-CM | POA: Diagnosis present

## 2012-02-24 DIAGNOSIS — J4489 Other specified chronic obstructive pulmonary disease: Secondary | ICD-10-CM | POA: Diagnosis present

## 2012-02-24 DIAGNOSIS — F3289 Other specified depressive episodes: Secondary | ICD-10-CM | POA: Diagnosis present

## 2012-02-24 HISTORY — DX: Acute myocardial infarction, unspecified: I21.9

## 2012-02-24 HISTORY — DX: Pneumonia, unspecified organism: J18.9

## 2012-02-24 HISTORY — DX: Essential (primary) hypertension: I10

## 2012-02-24 HISTORY — DX: Chronic obstructive pulmonary disease, unspecified: J44.9

## 2012-02-24 HISTORY — DX: Atherosclerotic heart disease of native coronary artery without angina pectoris: I25.10

## 2012-02-24 HISTORY — DX: Anxiety disorder, unspecified: F41.9

## 2012-02-24 HISTORY — DX: Unspecified osteoarthritis, unspecified site: M19.90

## 2012-02-24 HISTORY — DX: Unspecified atrial fibrillation: I48.91

## 2012-02-24 LAB — CBC WITH DIFFERENTIAL/PLATELET
Basophils Relative: 0 % (ref 0–1)
Eosinophils Absolute: 0.1 10*3/uL (ref 0.0–0.7)
Eosinophils Relative: 1 % (ref 0–5)
Lymphocytes Relative: 16 % (ref 12–46)
Monocytes Absolute: 1.2 10*3/uL — ABNORMAL HIGH (ref 0.1–1.0)
Neutro Abs: 7.5 10*3/uL (ref 1.7–7.7)
Platelets: 292 10*3/uL (ref 150–400)
RDW: 12.3 % (ref 11.5–15.5)
WBC: 10.5 10*3/uL (ref 4.0–10.5)

## 2012-02-24 LAB — POCT I-STAT, CHEM 8
BUN: 16 mg/dL (ref 6–23)
Calcium, Ion: 1.16 mmol/L (ref 1.13–1.30)
Chloride: 101 mEq/L (ref 96–112)
Glucose, Bld: 115 mg/dL — ABNORMAL HIGH (ref 70–99)
HCT: 40 % (ref 36.0–46.0)
Potassium: 3.1 mEq/L — ABNORMAL LOW (ref 3.5–5.1)

## 2012-02-24 LAB — PROTIME-INR: Prothrombin Time: 18.7 seconds — ABNORMAL HIGH (ref 11.6–15.2)

## 2012-02-24 MED ORDER — DILTIAZEM HCL 100 MG IV SOLR
10.0000 mg/h | INTRAVENOUS | Status: DC
  Administered 2012-02-25: 10 mg/h via INTRAVENOUS
  Filled 2012-02-24 (×2): qty 100

## 2012-02-24 MED ORDER — POTASSIUM CHLORIDE 10 MEQ/100ML IV SOLN
10.0000 meq | Freq: Once | INTRAVENOUS | Status: AC
  Administered 2012-02-24: 10 meq via INTRAVENOUS
  Filled 2012-02-24: qty 100

## 2012-02-24 MED ORDER — DILTIAZEM HCL 25 MG/5ML IV SOLN
10.0000 mg | Freq: Once | INTRAVENOUS | Status: AC
  Administered 2012-02-24: 10 mg via INTRAVENOUS
  Filled 2012-02-24: qty 5

## 2012-02-24 MED ORDER — SODIUM CHLORIDE 0.9 % IV BOLUS (SEPSIS)
500.0000 mL | Freq: Once | INTRAVENOUS | Status: AC
  Administered 2012-02-24: 500 mL via INTRAVENOUS

## 2012-02-24 MED ORDER — DILTIAZEM HCL 50 MG/10ML IV SOLN: 10.0000 mg | Freq: Once | INTRAVENOUS | Status: DC

## 2012-02-24 NOTE — H&P (Signed)
Cheyenne [name] is an 78 y.o. female.   Chief Complaint: Afib HPI:   The patient is an 78 yo caucasian female with a history or HTN, HLD, CAD with CABG in 1999, Flutter ablation, DM2, PVD, diastolic dysfunction.  Carotid studies revealed mild fibrous plaque in both carotids without significant flow reduction..  Lower extremity dopplers revealed ABI's of 0.97 on the right and 0.83 on the left.  She has 50 to 60% narrowing in the RCIA and >70% in the right SFA.  Right post. tib and peroneal art were occluded.  She is on chronic coumadin.  She has carotid tumors and followed by Dr. Jearld Fenton.   EF 55% with grade two diastolic dysfunction. Her last nuclerar stress test was in April 2012 and was low risk.  She presents today with Afib RVR.  She states she could feel her heart go into the abnormal rhythm last night at 1930hrs.  She took two doses of Toprol 50mg  extra during the night as instructed by the on-call Extender.  This did not provide any change so she was instructed to come to the ER.  EKG revealed Accelerated junctional rhythm/afib.  She was given a 10mg  bolus of diltiazem which has slowed her down.  She denies CP, SOB, dizziness, N, V, Fever, LEE, abd pain.  Past Medical History  Diagnosis Date  . Atrial fibrillation   . Coronary artery disease     Past Surgical History  Procedure Date  . Nasal sinus surgery   . Quadruple  bypass     Family History  Problem Relation Age of Onset  . Heart disease Father   . Breast cancer Paternal Aunt    Social History:  reports that she has never smoked. She does not have any smokeless tobacco history on file. She reports that she does not drink alcohol or use illicit drugs.  Allergies:  Allergies  Allergen Reactions  . Elavil (Amitriptyline) Other (See Comments)    Felt drunk  . Nifedipine Other (See Comments)    Heart races  . Tegretol (Carbamazepine) Other (See Comments)    Felt drunk     (Not in a hospital admission)  Results for orders  placed during the hospital encounter of 02/24/12 (from the past 48 hour(s))  CBC WITH DIFFERENTIAL     Status: Abnormal   Collection Time   02/24/12  1:57 PM      Component Value Range Comment   WBC 10.5  4.0 - 10.5 K/uL    RBC 4.34  3.87 - 5.11 MIL/uL    Hemoglobin 13.6  12.0 - 15.0 g/dL    HCT 16.1  09.6 - 04.5 %    MCV 90.3  78.0 - 100.0 fL    MCH 31.3  26.0 - 34.0 pg    MCHC 34.7  30.0 - 36.0 g/dL    RDW 40.9  81.1 - 91.4 %    Platelets 292  150 - 400 K/uL    Neutrophils Relative 72  43 - 77 %    Neutro Abs 7.5  1.7 - 7.7 K/uL    Lymphocytes Relative 16  12 - 46 %    Lymphs Abs 1.7  0.7 - 4.0 K/uL    Monocytes Relative 11  3 - 12 %    Monocytes Absolute 1.2 (*) 0.1 - 1.0 K/uL    Eosinophils Relative 1  0 - 5 %    Eosinophils Absolute 0.1  0.0 - 0.7 K/uL    Basophils Relative 0  0 - 1 %    Basophils Absolute 0.0  0.0 - 0.1 K/uL   PROTIME-INR     Status: Abnormal   Collection Time   02/24/12  1:57 PM      Component Value Range Comment   Prothrombin Time 18.7 (*) 11.6 - 15.2 seconds    INR 1.53 (*) 0.00 - 1.49   POCT I-STAT, CHEM 8     Status: Abnormal   Collection Time   02/24/12  2:30 PM      Component Value Range Comment   Sodium 139  135 - 145 mEq/L    Potassium 3.1 (*) 3.5 - 5.1 mEq/L    Chloride 101  96 - 112 mEq/L    BUN 16  6 - 23 mg/dL    Creatinine, Ser 8.29  0.50 - 1.10 mg/dL    Glucose, Bld 562 (*) 70 - 99 mg/dL    Calcium, Ion 1.30  8.65 - 1.30 mmol/L    TCO2 25  0 - 100 mmol/L    Hemoglobin 13.6  12.0 - 15.0 g/dL    HCT 78.4  69.6 - 29.5 %    Dg Chest Port 1 View  02/24/2012  *RADIOLOGY REPORT*  Clinical Data: Atrial fibrillation  PORTABLE CHEST - 1 VIEW  Comparison: CT chest dated 02/15/2010  Findings: Chronic interstitial markings with bilateral lower lobe scarring versus atelectasis.  The heart is top normal in size. Postsurgical changes related to prior CABG.  IMPRESSION: No evidence of acute cardiopulmonary disease.  Chronic interstitial markings.   Original Report Authenticated By: Charline Bills, M.D.    Review of Systems  Constitutional: Negative for fever and diaphoresis.  HENT: Positive for congestion.   Eyes: Negative for blurred vision and double vision.  Respiratory: Positive for cough. Negative for shortness of breath.   Cardiovascular: Positive for palpitations and claudication. Negative for chest pain, orthopnea, leg swelling and PND.  Gastrointestinal: Positive for constipation. Negative for nausea, vomiting, abdominal pain, diarrhea, blood in stool and melena.  Genitourinary: Negative for dysuria and hematuria.  Musculoskeletal: Positive for joint pain (Knees). Negative for myalgias.  Neurological: Negative for dizziness, weakness and headaches.    Blood pressure 114/71, pulse 120, temperature 98.7 F (37.1 C), temperature source Oral, resp. rate 20, SpO2 98.00%. Physical Exam  Constitutional: She is oriented to person, place, and time. She appears well-developed and well-nourished. No distress.  HENT:  Head: Normocephalic and atraumatic.  Eyes: EOM are normal. Pupils are equal, round, and reactive to light. No scleral icterus.  Neck: Normal range of motion. Neck supple.       Small ~3cm mass posterior to left SCM.  Cardiovascular: An irregularly irregular rhythm present. Tachycardia present.   No murmur heard. Pulses:      Radial pulses are 2+ on the right side, and 2+ on the left side.       Dorsalis pedis pulses are 0 on the right side, and 0 on the left side.       Posterior tibial pulses are 1+ on the right side, and 0 on the left side.       No carotid Bruits. Feet are warm  Respiratory: Effort normal. She has no wheezes. She has no rales.       BS are decreased.  GI: Soft. Bowel sounds are normal. There is no tenderness.  Musculoskeletal: She exhibits no edema.  Lymphadenopathy:    She has no cervical adenopathy.  Neurological: She is alert and oriented to person, place, and  time. She exhibits normal  muscle tone.  Skin: Skin is warm and dry.  Psychiatric: She has a normal mood and affect.     Assessment/Plan Patient Active Problem List  Diagnosis  . DIABETES MELLITUS  . ADRENAL MASS, LEFT  . HYPERLIPIDEMIA  . ANXIETY  . DEPRESSION  . HYPERTENSION  . COPD  . UTI  . OVARIAN CYST  . ARTHRITIS  . ABDOMINAL PAIN, EPIGASTRIC  . LIVER FUNCTION TESTS, ABNORMAL, HX OF  . Atrial fibrillation with RVR   Plan:  Admit to tele.  Start IV diltiazem at 5mg /Hr.  Continue coumadin and increase Amiodarone.  Consider DCCV.  Her INR is subtherapeutic however, she knows she went out of rhythm last night.  Starting IV heparin per pharmacy.   D Echo-last one was in Sept 2010.  Patricia Fargo 02/24/2012, 3:52 PM

## 2012-02-24 NOTE — ED Notes (Signed)
Pt presents to department for evaluation of atrial fibrillation. States she was sitting at home when she felt her heart "skipping" beats. Pt spoke with cardiologist and was told to take 50mg  lopressor x2, no relief. Pt denies chest pain. Respirations unlabored. Skin warm and dry. She is conscious alert and oriented x4.

## 2012-02-24 NOTE — ED Provider Notes (Signed)
History     CSN: [id number]  Arrival date & time 02/24/12  1227   First MD Initiated Contact with Patient 02/24/12 1308      Chief Complaint  Patient presents with  . Atrial Fibrillation    (Consider location/radiation/quality/duration/timing/severity/associated sxs/prior treatment) Patient is a 78 y.o. female presenting with atrial fibrillation. The history is provided by the patient.  Atrial Fibrillation This is a recurrent problem. Associated symptoms include chest pain. Pertinent negatives include no abdominal pain, no headaches and no shortness of breath.   patient states that she is in atrial fibrillation. He has a history of same does not normally have. She states she's had an ablation never able to control atrial flutter but not her atrophic ablation. She states she was on sotalol, it was not working so she was changed over to amiodarone. She states that at 7:30 last night she developed A. fib. She's been given metoprolol twice per cardiology's recommendations. She's had no relief. She is on Coumadin, but states the levels have been difficult to control since she stopped her Dilantin. She sees Dr. Tresa Endo as her primary cardiologist.  Past Medical History  Diagnosis Date  . Atrial fibrillation   . Coronary artery disease     Past Surgical History  Procedure Date  . Nasal sinus surgery   . Quadruple  bypass     Family History  Problem Relation Age of Onset  . Heart disease Father   . Breast cancer Paternal Aunt     History  Substance Use Topics  . Smoking status: Never Smoker   . Smokeless tobacco: Not on file  . Alcohol Use: No    OB History    Grav Para Term Preterm Abortions TAB SAB Ect Mult Living   3 3        3       Review of Systems  Constitutional: Positive for fatigue. Negative for activity change and appetite change.  HENT: Negative for neck stiffness.   Eyes: Negative for pain.  Respiratory: Negative for chest tightness and shortness of breath.     Cardiovascular: Positive for chest pain and palpitations. Negative for leg swelling.  Gastrointestinal: Negative for nausea, vomiting, abdominal pain and diarrhea.  Genitourinary: Negative for flank pain.  Musculoskeletal: Negative for back pain.  Skin: Negative for rash.  Neurological: Negative for weakness, numbness and headaches.  Psychiatric/Behavioral: Negative for behavioral problems.    Allergies  Elavil; Nifedipine; and Tegretol  Home Medications   No current outpatient prescriptions on file.  BP 147/91  Pulse 97  Temp 97.7 F (36.5 C) (Oral)  Resp 18  SpO2 96%  Physical Exam  Nursing note and vitals reviewed. Constitutional: She is oriented to person, place, and time. She appears well-developed and well-nourished.  HENT:  Head: Normocephalic and atraumatic.  Eyes: EOM are normal. Pupils are equal, round, and reactive to light.  Neck: Normal range of motion. Neck supple.  Cardiovascular:  No murmur heard.      Irregular rhythm. Tachycardia.  Pulmonary/Chest: Effort normal and breath sounds normal. No respiratory distress. She has no wheezes. She has no rales.  Abdominal: Soft. Bowel sounds are normal. She exhibits no distension. There is no tenderness. There is no rebound and no guarding.  Musculoskeletal: Normal range of motion.  Neurological: She is alert and oriented to person, place, and time. No cranial nerve deficit.  Skin: Skin is warm and dry.  Psychiatric: She has a normal mood and affect. Her speech is normal.  ED Course  Procedures (including critical care time)  Labs Reviewed  CBC WITH DIFFERENTIAL - Abnormal; Notable for the following:    Monocytes Absolute 1.2 (*)     All other components within normal limits  PROTIME-INR - Abnormal; Notable for the following:    Prothrombin Time 18.7 (*)     INR 1.53 (*)     All other components within normal limits  POCT I-STAT, CHEM 8 - Abnormal; Notable for the following:    Potassium 3.1 (*)      Glucose, Bld 115 (*)     All other components within normal limits  HEPARIN LEVEL (UNFRACTIONATED) - Abnormal; Notable for the following:    Heparin Unfractionated <0.10 (*)     All other components within normal limits  PROTIME-INR - Abnormal; Notable for the following:    Prothrombin Time 18.5 (*)     INR 1.51 (*)     All other components within normal limits  BASIC METABOLIC PANEL - Abnormal; Notable for the following:    Glucose, Bld 115 (*)     GFR calc non Af Amer 63 (*)     GFR calc Af Amer 74 (*)     All other components within normal limits  GLUCOSE, CAPILLARY - Abnormal; Notable for the following:    Glucose-Capillary 129 (*)     All other components within normal limits  TROPONIN I  CBC  BASIC METABOLIC PANEL  CBC  HEPARIN LEVEL (UNFRACTIONATED)   Dg Chest Port 1 View  02/24/2012  *RADIOLOGY REPORT*  Clinical Data: Atrial fibrillation  PORTABLE CHEST - 1 VIEW  Comparison: CT chest dated 02/15/2010  Findings: Chronic interstitial markings with bilateral lower lobe scarring versus atelectasis.  The heart is top normal in size. Postsurgical changes related to prior CABG.  IMPRESSION: No evidence of acute cardiopulmonary disease.  Chronic interstitial markings.  Original Report Authenticated By: Charline Bills, M.D.     1. Atrial fibrillation with RVR   2. Hypokalemia     of a  Date: 02/24/2012  Rate: 126  Rhythm: atrial fibrillation  QRS Axis: left  Intervals: afib  ST/T Wave abnormalities: normal  Conduction Disutrbances:none  Narrative Interpretation: afib is new since last tracing  Old EKG Reviewed: changes noted    MDM  Patient with afib and history of same. Already on coumadin, but subthearputic. Hypokalemia will be supplemented. Admitted to cardiology  Juliet Rude. Rubin Payor, MD 02/25/12 1334

## 2012-02-24 NOTE — ED Notes (Signed)
Cardiology at the bedside.

## 2012-02-25 ENCOUNTER — Encounter (HOSPITAL_COMMUNITY)
Admission: EM | Disposition: A | Discharge status: Home / Self Care | Payer: Self-pay | Source: Home / Self Care | Attending: Cardiovascular Disease

## 2012-02-25 ENCOUNTER — Encounter (HOSPITAL_COMMUNITY): Payer: Self-pay | Admitting: *Deleted

## 2012-02-25 LAB — BASIC METABOLIC PANEL
Chloride: 103 mEq/L (ref 96–112)
Creatinine, Ser: 0.82 mg/dL (ref 0.50–1.10)
GFR calc Af Amer: 74 mL/min — ABNORMAL LOW (ref >90)
Potassium: 3.8 mEq/L (ref 3.5–5.1)
Sodium: 140 mEq/L (ref 135–145)

## 2012-02-25 LAB — CBC
MCH: 30.6 pg (ref 26.0–34.0)
MCV: 91.5 fL (ref 78.0–100.0)
Platelets: 276 10*3/uL (ref 150–400)
RDW: 12.5 % (ref 11.5–15.5)

## 2012-02-25 LAB — PROTIME-INR: Prothrombin Time: 18.5 seconds — ABNORMAL HIGH (ref 11.6–15.2)

## 2012-02-25 LAB — GLUCOSE, CAPILLARY: Glucose-Capillary: 129 mg/dL — ABNORMAL HIGH (ref 70–99)

## 2012-02-25 SURGERY — Surgical Case

## 2012-02-25 MED ORDER — AMIODARONE HCL 200 MG PO TABS: 400.0000 mg | ORAL_TABLET | Freq: Every day | ORAL | Status: DC

## 2012-02-25 MED ORDER — WARFARIN - PHARMACIST DOSING INPATIENT: Freq: Every day | Status: DC

## 2012-02-25 MED ORDER — MIDAZOLAM HCL 10 MG/2ML IJ SOLN: 10.0000 mg | INTRAMUSCULAR | Status: DC

## 2012-02-25 MED ORDER — ESOMEPRAZOLE MAGNESIUM 40 MG PO PACK: 40.0000 mg | PACK | Freq: Every day | ORAL | Status: DC

## 2012-02-25 MED ORDER — SODIUM CHLORIDE 0.9 % IJ SOLN: 3.0000 mL | INTRAMUSCULAR | Status: DC | PRN

## 2012-02-25 MED ORDER — DILTIAZEM HCL ER COATED BEADS 120 MG PO CP24: 120.0000 mg | ORAL_CAPSULE | Freq: Every day | ORAL | Status: DC

## 2012-02-25 MED ORDER — FENTANYL CITRATE 0.05 MG/ML IJ SOLN: 250.0000 ug | Freq: Once | INTRAMUSCULAR | Status: DC

## 2012-02-25 MED ORDER — LORATADINE 10 MG PO TABS
10.0000 mg | ORAL_TABLET | Freq: Every day | ORAL | Status: DC
  Filled 2012-02-25: qty 1

## 2012-02-25 MED ORDER — SODIUM CHLORIDE 0.45 % IV SOLN: INTRAVENOUS | Status: DC

## 2012-02-25 MED ORDER — SODIUM CHLORIDE 0.9 % IV SOLN: INTRAVENOUS | Status: DC

## 2012-02-25 MED ORDER — TIOTROPIUM BROMIDE MONOHYDRATE 18 MCG IN CAPS
18.0000 ug | ORAL_CAPSULE | Freq: Every day | RESPIRATORY_TRACT | Status: DC
  Filled 2012-02-25: qty 5

## 2012-02-25 MED ORDER — EZETIMIBE 10 MG PO TABS: 10.0000 mg | ORAL_TABLET | Freq: Every evening | ORAL | Status: DC

## 2012-02-25 MED ORDER — POLYETHYLENE GLYCOL 3350 17 GM/SCOOP PO POWD
17.0000 g | Freq: Every day | ORAL | Status: DC
Start: 2012-02-25 — End: 2012-02-25
  Filled 2012-02-25: qty 255

## 2012-02-25 MED ORDER — ATORVASTATIN CALCIUM 80 MG PO TABS: 80.0000 mg | ORAL_TABLET | Freq: Every evening | ORAL | Status: DC

## 2012-02-25 MED ORDER — RAMIPRIL 10 MG PO CAPS
10.0000 mg | ORAL_CAPSULE | Freq: Every day | ORAL | Status: DC
  Administered 2012-02-25: 10 mg via ORAL
  Filled 2012-02-25: qty 1

## 2012-02-25 MED ORDER — PHENYTOIN SODIUM EXTENDED 100 MG PO CAPS
100.0000 mg | ORAL_CAPSULE | Freq: Every day | ORAL | Status: DC
  Filled 2012-02-25: qty 1

## 2012-02-25 MED ORDER — DILTIAZEM HCL ER COATED BEADS 240 MG PO CP24: 240.0000 mg | ORAL_CAPSULE | Freq: Every day | ORAL | Status: DC

## 2012-02-25 MED ORDER — AMIODARONE HCL 400 MG PO TABS: 400.0000 mg | ORAL_TABLET | Freq: Every day | ORAL | Status: DC

## 2012-02-25 MED ORDER — ALPRAZOLAM 0.25 MG PO TABS: 0.2500 mg | ORAL_TABLET | Freq: Two times a day (BID) | ORAL | Status: DC | PRN

## 2012-02-25 MED ORDER — CILOSTAZOL 100 MG PO TABS
100.0000 mg | ORAL_TABLET | Freq: Every day | ORAL | Status: DC
  Administered 2012-02-25: 100 mg via ORAL
  Filled 2012-02-25: qty 1

## 2012-02-25 MED ORDER — LIDOCAINE VISCOUS 2 % MT SOLN
OROMUCOSAL | Status: AC
  Filled 2012-02-25: qty 15

## 2012-02-25 MED ORDER — METOPROLOL SUCCINATE ER 100 MG PO TB24
100.0000 mg | ORAL_TABLET | Freq: Every day | ORAL | Status: DC
  Administered 2012-02-25: 100 mg via ORAL
  Filled 2012-02-25: qty 1

## 2012-02-25 MED ORDER — MIDAZOLAM HCL 10 MG/2ML IJ SOLN
INTRAMUSCULAR | Status: AC
  Filled 2012-02-25: qty 2

## 2012-02-25 MED ORDER — FENTANYL CITRATE 0.05 MG/ML IJ SOLN
INTRAMUSCULAR | Status: AC
  Filled 2012-02-25: qty 2

## 2012-02-25 MED ORDER — HYDROCHLOROTHIAZIDE 25 MG PO TABS
25.0000 mg | ORAL_TABLET | Freq: Every day | ORAL | Status: DC
  Administered 2012-02-25: 25 mg via ORAL
  Filled 2012-02-25: qty 1

## 2012-02-25 MED ORDER — EZETIMIBE 10 MG PO TABS
10.0000 mg | ORAL_TABLET | Freq: Every day | ORAL | Status: DC
  Filled 2012-02-25: qty 1

## 2012-02-25 MED ORDER — OFF THE BEAT BOOK
Freq: Once | Status: AC
  Administered 2012-02-25: 13:00:00
  Filled 2012-02-25: qty 1

## 2012-02-25 MED ORDER — CILOSTAZOL 100 MG PO TABS: 100.0000 mg | ORAL_TABLET | Freq: Two times a day (BID) | ORAL | Status: DC

## 2012-02-25 MED ORDER — SODIUM CHLORIDE 0.9 % IV SOLN: 250.0000 mL | INTRAVENOUS | Status: DC | PRN

## 2012-02-25 MED ORDER — HYDROCODONE-ACETAMINOPHEN 5-325 MG PO TABS: 1.0000 | ORAL_TABLET | Freq: Four times a day (QID) | ORAL | Status: DC | PRN

## 2012-02-25 MED ORDER — FUROSEMIDE 20 MG PO TABS
20.0000 mg | ORAL_TABLET | ORAL | Status: DC
  Administered 2012-02-25: 20 mg via ORAL
  Filled 2012-02-25: qty 1

## 2012-02-25 MED ORDER — FENTANYL CITRATE 0.05 MG/ML IJ SOLN: 250.0000 ug | INTRAMUSCULAR | Status: DC

## 2012-02-25 MED ORDER — HEPARIN (PORCINE) IN NACL 100-0.45 UNIT/ML-% IJ SOLN
1100.0000 [IU]/h | INTRAMUSCULAR | Status: DC
  Administered 2012-02-25: 1100 [IU]/h via INTRAVENOUS

## 2012-02-25 MED ORDER — MIDAZOLAM HCL 10 MG/2ML IJ SOLN: 10.0000 mg | Freq: Once | INTRAMUSCULAR | Status: DC

## 2012-02-25 MED ORDER — ISOSORBIDE MONONITRATE ER 30 MG PO TB24
30.0000 mg | ORAL_TABLET | Freq: Every day | ORAL | Status: DC
  Administered 2012-02-25: 30 mg via ORAL
  Filled 2012-02-25: qty 1

## 2012-02-25 MED ORDER — AMIODARONE HCL 200 MG PO TABS
400.0000 mg | ORAL_TABLET | Freq: Every day | ORAL | Status: DC
  Administered 2012-02-25: 400 mg via ORAL
  Filled 2012-02-25: qty 2

## 2012-02-25 MED ORDER — PANTOPRAZOLE SODIUM 40 MG PO TBEC
40.0000 mg | DELAYED_RELEASE_TABLET | Freq: Every day | ORAL | Status: DC
  Administered 2012-02-25: 40 mg via ORAL
  Filled 2012-02-25: qty 1

## 2012-02-25 MED ORDER — HYDROCHLOROTHIAZIDE 25 MG PO TABS: 25.0000 mg | ORAL_TABLET | Freq: Every day | ORAL | Status: DC

## 2012-02-25 MED ORDER — RAMIPRIL 10 MG PO TABS: 10.0000 mg | ORAL_TABLET | Freq: Every day | ORAL | Status: DC

## 2012-02-25 MED ORDER — WARFARIN SODIUM 7.5 MG PO TABS
7.5000 mg | ORAL_TABLET | Freq: Once | ORAL | Status: DC
  Filled 2012-02-25: qty 1

## 2012-02-25 MED ORDER — SODIUM CHLORIDE 0.9 % IJ SOLN
3.0000 mL | Freq: Two times a day (BID) | INTRAMUSCULAR | Status: DC
  Administered 2012-02-25: 3 mL via INTRAVENOUS

## 2012-02-25 MED ORDER — POTASSIUM CHLORIDE CRYS ER 20 MEQ PO TBCR
40.0000 meq | EXTENDED_RELEASE_TABLET | Freq: Once | ORAL | Status: AC
  Administered 2012-02-24: 40 meq via ORAL

## 2012-02-25 MED ORDER — METOPROLOL SUCCINATE ER 50 MG PO TB24
50.0000 mg | ORAL_TABLET | Freq: Every day | ORAL | Status: DC
  Filled 2012-02-25: qty 1

## 2012-02-25 MED ORDER — PHENYTOIN SODIUM EXTENDED 100 MG PO CAPS
200.0000 mg | ORAL_CAPSULE | Freq: Every day | ORAL | Status: DC
  Filled 2012-02-25: qty 2

## 2012-02-25 MED ORDER — SODIUM CHLORIDE 0.9 % IJ SOLN: 3.0000 mL | Freq: Two times a day (BID) | INTRAMUSCULAR | Status: DC

## 2012-02-25 MED ORDER — HEPARIN (PORCINE) IN NACL 100-0.45 UNIT/ML-% IJ SOLN
900.0000 [IU]/h | INTRAMUSCULAR | Status: DC
  Administered 2012-02-24: 900 [IU]/h via INTRAVENOUS
  Filled 2012-02-25: qty 250

## 2012-02-25 MED ORDER — BENZOCAINE 20 % MT SOLN: 1.0000 "application " | OROMUCOSAL | Status: DC | PRN

## 2012-02-25 MED ORDER — CALCIUM CARBONATE 1250 (500 CA) MG PO TABS
1.0000 | ORAL_TABLET | Freq: Two times a day (BID) | ORAL | Status: DC
  Administered 2012-02-25: 500 mg via ORAL
  Filled 2012-02-25 (×3): qty 1

## 2012-02-25 MED ORDER — ATORVASTATIN CALCIUM 80 MG PO TABS
80.0000 mg | ORAL_TABLET | Freq: Every day | ORAL | Status: DC
  Filled 2012-02-25: qty 1

## 2012-02-25 NOTE — H&P (Signed)
Pt. Seen and examined. Agree with the NP/PA-C note as written.  Kenneth C. Hilty, MD, FACC Attending Cardiologist [facility]   

## 2012-02-25 NOTE — Progress Notes (Signed)
Pt. Seen and examined. Agree with the NP/PA-C note as written.  Pt. Seen in the endoscopy suite. She is now in sinus rhythm which was confirmed with EKG. TEE/Cardioversion is no longer necessary. She can be discharged home on amiodarone 400 mg daily (or 200 mg BID). Continue coumadin. Follow -up with her primary cardiologist.  Chrystie Nose, MD, [facility] Attending Cardiologist [facility]

## 2012-02-25 NOTE — Progress Notes (Signed)
Patient"s procedure cancelled due to no longer in afib confirmed by ekg.

## 2012-02-25 NOTE — Progress Notes (Signed)
Discharge orders received. Patient stable and education is complete. Patient discharged to home with family member. [name], [name]

## 2012-02-25 NOTE — Progress Notes (Signed)
[facility]  Subjective: Feels fine except for the fluttering in her chest.  Objective: Vital signs in last 24 hours: Temp:  [97.5 F (36.4 C)-98.7 F (37.1 C)] 97.5 F (36.4 C) (07/22 0800) Pulse Rate:  [75-125] 99  (07/22 0800) Resp:  [18-20] 18  (07/22 0800) BP: (114-160)/(68-81) 160/81 mmHg (07/22 0800) SpO2:  [92 %-99 %] 92 % (07/22 0800)    Intake/Output from previous day:   Intake/Output this shift:    Medications Current Facility-Administered Medications  Medication Dose Route Frequency Provider Last Rate Last Dose  . 0.9 %  sodium chloride infusion   Intravenous Continuous Wilburt Finlay, PA      . ALPRAZolam Prudy Feeler) tablet 0.25 mg  0.25 mg Oral BID PRN Wilburt Finlay, PA      . amiodarone (PACERONE) tablet 400 mg  400 mg Oral Daily Wilburt Finlay, PA      . atorvastatin (LIPITOR) tablet 80 mg  80 mg Oral q1800 Wilburt Finlay, PA      . calcium carbonate (OS-CAL - dosed in mg of elemental calcium) tablet 500 mg of elemental calcium  1 tablet Oral BID WC Wilburt Finlay, PA   500 mg of elemental calcium at 02/25/12 0846  . cilostazol (PLETAL) tablet 100 mg  100 mg Oral Daily Wilburt Finlay, PA      . diltiazem (CARDIZEM) 100 mg in dextrose 5 % 100 mL infusion  5 mg/hr Intravenous Titrated Wilburt Finlay, PA      . diltiazem (CARDIZEM) injection 10 mg  10 mg Intravenous Once [employer]. Pickering, MD   10 mg at 02/24/12 1434  . ezetimibe (ZETIA) tablet 10 mg  10 mg Oral Daily Wilburt Finlay, PA      . furosemide (LASIX) tablet 20 mg  20 mg Oral QODAY Wilburt Finlay, PA      . heparin ADULT infusion 100 units/mL (25000 units/250 mL)  1,100 Units/hr Intravenous Continuous Drake Leach Rumbarger, PHARMD 11 mL/hr at 02/25/12 0847 1,100 Units/hr at 02/25/12 0847  . hydrochlorothiazide (HYDRODIURIL) tablet 25 mg  25 mg Oral Daily Wilburt Finlay, PA      . HYDROcodone-acetaminophen (NORCO/VICODIN) 5-325 MG per tablet 1 tablet  1 tablet Oral Q6H PRN Wilburt Finlay, PA      .  isosorbide mononitrate (IMDUR) 24 hr tablet 30 mg  30 mg Oral Daily Wilburt Finlay, PA      . loratadine (CLARITIN) tablet 10 mg  10 mg Oral Daily Wilburt Finlay, PA      . metoprolol succinate (TOPROL-XL) 24 hr tablet 100 mg  100 mg Oral Daily Wilburt Finlay, PA      . pantoprazole (PROTONIX) EC tablet 40 mg  40 mg Oral Q1200 Wilburt Finlay, PA      . phenytoin (DILANTIN) ER capsule 100 mg  100 mg Oral Daily Wilburt Finlay, PA      . phenytoin (DILANTIN) ER capsule 200 mg  200 mg Oral QHS Wilburt Finlay, PA      . polyethylene glycol powder (GLYCOLAX/MIRALAX) container 17 g  17 g Oral Daily Wilburt Finlay, PA      . potassium chloride 10 mEq in 100 mL IVPB  10 mEq Intravenous Once Harrold Donath R. Pickering, MD   10 mEq at 02/24/12 1527  . potassium chloride SA (K-DUR,KLOR-CON) CR tablet 40 mEq  40 mEq Oral Once Wilburt Finlay, PA   40 mEq at 02/24/12 2200  . ramipril (ALTACE) capsule 10 mg  10 mg Oral Daily Wilburt Finlay, [place]      .  sodium chloride 0.9 % bolus 500 mL  500 mL Intravenous Once Harrold Donath R. Pickering, MD   500 mL at 02/24/12 1419  . sodium chloride 0.9 % injection 3 mL  3 mL Intravenous Q12H Wilburt Finlay, PA      . tiotropium [facility]) inhalation capsule 18 mcg  18 mcg Inhalation Daily Wilburt Finlay, PA      . warfarin (COUMADIN) tablet 7.5 mg  7.5 mg Oral ONCE-1800 Drake Leach Rumbarger, PHARMD      . Warfarin - Pharmacist Dosing Inpatient   Does not apply [id number] Thurmon Fair, MD      . DISCONTD: amiodarone (PACERONE) tablet 400 mg  400 mg Oral Daily Wilburt Finlay, PA      . DISCONTD: atorvastatin (LIPITOR) tablet 80 mg  80 mg Oral QPM Wilburt Finlay, PA      . DISCONTD: cilostazol (PLETAL) tablet 100 mg  100 mg Oral BID Wilburt Finlay, PA      . DISCONTD: diltiazem (CARDIZEM) injection SOLN 10 mg  10 mg Intravenous Once Harrold Donath R. Pickering, MD      . DISCONTD: esomeprazole (NEXIUM) suspension 40 mg  40 mg Oral QAC breakfast Wilburt Finlay, [place]      . DISCONTD: ezetimibe (ZETIA) tablet 10 mg  10 mg Oral QPM Wilburt Finlay, PA        . DISCONTD: heparin ADULT infusion 100 units/mL (25000 units/250 mL)  900 Units/hr Intravenous Continuous Madolyn Frieze, PHARMD 9 mL/hr at 02/24/12 2030 900 Units/hr at 02/24/12 2030  . DISCONTD: hydrochlorothiazide (HYDRODIURIL) tablet 25 mg  25 mg Oral Daily Wilburt Finlay, [place]      . DISCONTD: metoprolol succinate (TOPROL-XL) 24 hr tablet 50 mg  50 mg Oral Daily Wilburt Finlay, PA      . DISCONTD: ramipril (ALTACE) tablet 10 mg  10 mg Oral Daily Wilburt Finlay, PA        PE: General appearance: alert, cooperative and no distress Lungs: clear to auscultation bilaterally Heart: irregularly irregular rhythm Extremities: NO LEE Pulses: 2+ and symmetric Skin: Warm and dry Neurologic: Grossly normal  Lab Results:   Basename 02/25/12 0635 02/24/12 1430 02/24/12 1357  WBC 8.6 -- 10.5  HGB 13.0 13.6 13.6  HCT 38.9 40.0 39.2  PLT 276 -- 292   BMET  Basename 02/25/12 0635 02/24/12 1430  NA 140 139  K 3.8 3.1*  CL 103 101  CO2 27 --  GLUCOSE 115* 115*  BUN 11 16  CREATININE 0.82 1.10  CALCIUM 9.0 --   PT/INR  Basename 02/25/12 0635 02/24/12 1357  LABPROT 18.5* 18.7*  INR 1.51* 1.53*    Assessment/Plan  Principal Problem:  *Atrial fibrillation with RVR Active Problems:  DIABETES MELLITUS  HYPERLIPIDEMIA  HYPERTENSION  COPD  Plan:  Still in afib with borderline RVR rate.  She is scheduled for TEE/DCCv at 1400hrs today. IV heparin started at admission.  In afib with subtherapeutic INR for less than 48 hrs. May not need TEE.   LOS: 1 day    Vinson Tietze 02/25/2012 9:22 AM

## 2012-02-25 NOTE — Progress Notes (Signed)
ANTICOAGULATION CONSULT NOTE - Initial Consult  Pharmacy Consult for heparin + coumadin Indication: atrial fibrillation  Allergies  Allergen Reactions  . Elavil (Amitriptyline) Other (See Comments)    Felt drunk  . Nifedipine Other (See Comments)    Heart races  . Tegretol (Carbamazepine) Other (See Comments)    Felt drunk    Patient Measurements:   Heparin Dosing Weight: 64kg  Vital Signs: Temp: 98.2 F (36.8 C) (07/22 0500) Temp src: Oral (07/22 0500) BP: 124/81 mmHg (07/22 0500) Pulse Rate: 86  (07/22 0500)  Labs:  Basename 02/25/12 0635 02/24/12 1430 02/24/12 1358 02/24/12 1357  HGB 13.0 13.6 -- --  HCT 38.9 40.0 -- 39.2  PLT 276 -- -- 292  APTT -- -- -- --  LABPROT 18.5* -- -- 18.7*  INR 1.51* -- -- 1.53*  HEPARINUNFRC <0.10* -- -- --  CREATININE 0.82 1.10 -- --  CKTOTAL -- -- -- --  CKMB -- -- -- --  TROPONINI -- -- 0.02 --    The CrCl is unknown because both a height and weight (above a minimum accepted value) are required for this calculation.   Medical History: Past Medical History  Diagnosis Date  . Atrial fibrillation   . Coronary artery disease     Medications:  Prescriptions prior to admission  Medication Sig Dispense Refill  . acetaminophen (TYLENOL) 500 MG tablet Take 1,000 mg by mouth every 6 (six) hours as needed. For pain      . ALPRAZolam (XANAX) 0.25 MG tablet Take 0.25 mg by mouth 2 (two) times daily as needed. For anxiety      . amiodarone (PACERONE) 200 MG tablet Take 200 mg by mouth daily.      Marland Kitchen atorvastatin (LIPITOR) 80 MG tablet Take 80 mg by mouth every evening.      . calcium carbonate (OS-CAL) 600 MG TABS Take 600 mg by mouth 2 (two) times daily with a meal.      . cilostazol (PLETAL) 100 MG tablet Take 100 mg by mouth 2 (two) times daily.      Marland Kitchen DILTIAZEM HCL PO Take by mouth.      . esomeprazole (NEXIUM) 40 MG packet Take 40 mg by mouth daily before breakfast.       . ezetimibe (ZETIA) 10 MG tablet Take 10 mg by mouth  every evening.       . furosemide (LASIX) 20 MG tablet Take 20 mg by mouth every other day.      . hydrochlorothiazide (HYDRODIURIL) 25 MG tablet Take 25 mg by mouth daily.      Marland Kitchen HYDROcodone-acetaminophen (NORCO/VICODIN) 5-325 MG per tablet Take 1 tablet by mouth every 6 (six) hours as needed. For pain      . isosorbide mononitrate (IMDUR) 30 MG 24 hr tablet Take 30 mg by mouth daily.      . Levocetirizine Dihydrochloride (XYZAL PO) Take 10 mg by mouth every evening.      . metoprolol succinate (TOPROL-XL) 100 MG 24 hr tablet Take 100 mg by mouth daily. Take with or immediately following a meal.      . Omega-3 Fatty Acids (FISH OIL) 1200 MG CAPS Take 1 capsule by mouth 2 (two) times daily.      . polyethylene glycol powder (GLYCOLAX/MIRALAX) powder Take 17 g by mouth daily.      . potassium chloride SA (K-DUR,KLOR-CON) 20 MEQ tablet Take 20 mEq by mouth daily.      . psyllium (METAMUCIL) 58.6 % powder Take  1 packet by mouth daily. At lunch or supper      . ramipril (ALTACE) 10 MG tablet Take 10 mg by mouth daily.      . Tiotropium Bromide Monohydrate (SPIRIVA HANDIHALER IN) Inhale 1 capsule into the lungs daily.       Marland Kitchen warfarin (COUMADIN) 5 MG tablet Take 5 mg by mouth every evening.        Assessment: 11 yof presented to the ED with afib w/ RVR. She is on chronic coumadin for afib. INR has been low since admission and heparin was started until INR is therapeutic. Today INR remains subtherapeutic at 1.51. Heparin level is also subtherapeutic and undetectable. No bleeding noted. CBC is stable.   Goal of Therapy:  INR 2-3 Heparin level 0.3-0.7 units/ml Monitor platelets by anticoagulation protocol: Yes   Plan:  1. Increase heparin to 1100units/hr 2. Check an 8 hour heparin level 3. Coumadin 7.5mg  PO x 1 tonight 4. Daily heparin level, CBC and INR  Johnmark Geiger, Drake Leach 02/25/2012,8:11 AM

## 2012-02-25 NOTE — Discharge Summary (Signed)
Physician Discharge Summary  Patient ID: Cheyenne [name] MRN: [id number] DOB/AGE: 1925-05-24 78 y.o.  Admit date: 02/24/2012 Discharge date: 02/25/2012  Admission Diagnoses: Afib RVR  Discharge Diagnoses:  Principal Problem:  *Atrial fibrillation with RVR Active Problems:  DIABETES MELLITUS  HYPERLIPIDEMIA  HYPERTENSION  COPD   Discharged Condition: stable  Hospital Course:   The patient is an 78 yo caucasian female with a history or HTN, HLD, CAD with CABG in 1999, Flutter ablation, DM2, PVD, diastolic dysfunction. Carotid studies revealed mild fibrous plaque in both carotids without significant flow reduction.. Lower extremity dopplers revealed ABI's of 0.97 on the right and 0.83 on the left. She has 50 to 60% narrowing in the RCIA and >70% in the right SFA. Right post. tib and peroneal artery were occluded. She is on chronic coumadin. She has carotid tumors and followed by Dr. Jearld Fenton. EF 55% with grade two diastolic dysfunction. Her last nuclerar stress test was in April 2012 and was low risk.   She presented with Afib RVR. She stated she could feel her heart go into the abnormal rhythm last night at 1930hrs. She took two doses of Toprol 50mg  extra during the night as instructed by the on-call Extender. This did not provide any change so she was instructed to come to the ER. EKG revealed Accelerated junctional rhythm/afib. She was given a 10mg  bolus of diltiazem which has slowed her down. She denies CP, SOB, dizziness, N, V, Fever, LEE, abd pain.  She was admitted to telemetry and started on IV diltiazem, after a 10mg  bolus was given, along with heparin.  Amiodarone was increased to 400mg  daily.  In the morning the Diltiazem was increased to 10mg /hr and she was scheduled for Tee/DCCV.  At the endoscopy lab she spontaneously converted to NSR.  She was seen by Dr. Rennis Golden who feels she is stable for DC home.  She has an INR check scheduled for Wed. This week and FU with me next Monday at which time  we will Recheck an EKG.   Follow-up Considerations:  Decrease Amiodarone to 200mg  daily.  Consults: None  Significant Diagnostic Studies:  PORTABLE CHEST - 1 VIEW  Comparison: CT chest dated 02/15/2010  Findings: Chronic interstitial markings with bilateral lower lobe  scarring versus atelectasis.  The heart is top normal in size. Postsurgical changes related to  prior CABG.  IMPRESSION:  No evidence of acute cardiopulmonary disease.   Treatments: Amiodarone, IV dilt, Heparin  Discharge Exam: Blood pressure 152/82, pulse 72, temperature 97.7 F (36.5 C), temperature source Oral, resp. rate 11, SpO2 93.00%.   Disposition:    Medication List  As of 02/25/2012  3:42 PM   STOP taking these medications         DILTIAZEM HCL PO         TAKE these medications         acetaminophen 500 MG tablet   Commonly known as: TYLENOL   Take 1,000 mg by mouth every 6 (six) hours as needed. For pain      ALPRAZolam 0.25 MG tablet   Commonly known as: XANAX   Take 0.25 mg by mouth 2 (two) times daily as needed. For anxiety      amiodarone 400 MG tablet   Commonly known as: PACERONE   Take 1 tablet (400 mg total) by mouth daily.      atorvastatin 80 MG tablet   Commonly known as: LIPITOR   Take 80 mg by mouth every evening.  calcium carbonate 600 MG Tabs   Commonly known as: OS-CAL   Take 600 mg by mouth 2 (two) times daily with a meal.      cilostazol 100 MG tablet   Commonly known as: PLETAL   Take 100 mg by mouth 2 (two) times daily.      diltiazem 120 MG 24 hr capsule   Commonly known as: CARDIZEM CD   Take 1 capsule (120 mg total) by mouth daily.      esomeprazole 40 MG packet   Commonly known as: NEXIUM   Take 40 mg by mouth daily before breakfast.      Fish Oil 1200 MG Caps   Take 1 capsule by mouth 2 (two) times daily.      furosemide 20 MG tablet   Commonly known as: LASIX   Take 20 mg by mouth every other day.      hydrochlorothiazide 25 MG tablet    Commonly known as: HYDRODIURIL   Take 25 mg by mouth daily.      HYDROcodone-acetaminophen 5-325 MG per tablet   Commonly known as: NORCO/VICODIN   Take 1 tablet by mouth every 6 (six) hours as needed. For pain      isosorbide mononitrate 30 MG 24 hr tablet   Commonly known as: IMDUR   Take 30 mg by mouth daily.      metoprolol succinate 100 MG 24 hr tablet   Commonly known as: TOPROL-XL   Take 100 mg by mouth daily. Take with or immediately following a meal.      polyethylene glycol powder powder   Commonly known as: GLYCOLAX/MIRALAX   Take 17 g by mouth daily.      potassium chloride SA 20 MEQ tablet   Commonly known as: K-DUR,KLOR-CON   Take 20 mEq by mouth daily.      psyllium 58.6 % powder   Commonly known as: METAMUCIL   Take 1 packet by mouth daily. At lunch or supper      ramipril 10 MG tablet   Commonly known as: ALTACE   Take 10 mg by mouth daily.      SPIRIVA HANDIHALER IN   Inhale 1 capsule into the lungs daily.      warfarin 5 MG tablet   Commonly known as: COUMADIN   Take 5 mg by mouth every evening.      XYZAL PO   Take 10 mg by mouth every evening.      ZETIA 10 MG tablet   Generic drug: ezetimibe   Take 10 mg by mouth every evening.           Follow-up Information    Follow up with Wilburt Finlay, PA on 02/27/2012. (2:30 PM  This is an INR check)    Contact information:   [address] [place] [address] [phone]       Follow up with Wilburt Finlay, PA on 03/03/2012. (1:40 PM.  hospital follow up.  EKG check.)    Contact information:   [address] [facility] [address] [place] [address] [phone]          Signed: Wilburt Finlay 02/25/2012, 3:42 PM

## 2012-02-27 MED FILL — Warfarin Sodium Tab 7.5 MG: ORAL | Qty: 1 | Status: AC

## 2012-02-27 MED FILL — Atorvastatin Calcium Tab 80 MG (Base Equivalent): ORAL | Qty: 1 | Status: AC

## 2012-02-27 MED FILL — Heparin Sodium (Porcine) 100 Unt/ML in Sodium Chloride 0.45%: INTRAMUSCULAR | Qty: 250 | Status: AC

## 2012-02-27 MED FILL — Potassium Chloride Microencapsulated Crys ER Tab 20 mEq: ORAL | Qty: 2 | Status: AC

## 2012-02-27 MED FILL — Sodium Chloride IV Soln 0.9%: INTRAVENOUS | Qty: 1000 | Status: AC

## 2012-05-29 HISTORY — PX: DOPPLER ECHOCARDIOGRAPHY: SHX263

## 2012-09-21 ENCOUNTER — Encounter: Payer: Self-pay | Admitting: *Deleted

## 2012-10-20 ENCOUNTER — Ambulatory Visit: Payer: Self-pay | Admitting: Cardiovascular Disease

## 2012-10-20 DIAGNOSIS — Z7901 Long term (current) use of anticoagulants: Secondary | ICD-10-CM | POA: Insufficient documentation

## 2013-01-01 ENCOUNTER — Ambulatory Visit (INDEPENDENT_AMBULATORY_CARE_PROVIDER_SITE_OTHER): Payer: Medicare Other | Admitting: Pharmacist Clinician (PhC)/ Clinical Pharmacy Specialist

## 2013-01-01 VITALS — BP 122/56 | HR 80

## 2013-01-01 DIAGNOSIS — I4891 Unspecified atrial fibrillation: Secondary | ICD-10-CM

## 2013-01-01 DIAGNOSIS — Z7901 Long term (current) use of anticoagulants: Secondary | ICD-10-CM

## 2013-01-19 ENCOUNTER — Other Ambulatory Visit (HOSPITAL_COMMUNITY): Payer: Self-pay | Admitting: Cardiovascular Disease

## 2013-01-19 DIAGNOSIS — I2510 Atherosclerotic heart disease of native coronary artery without angina pectoris: Secondary | ICD-10-CM

## 2013-01-29 ENCOUNTER — Ambulatory Visit: Payer: Medicare Other | Admitting: Pharmacist Clinician (PhC)/ Clinical Pharmacy Specialist

## 2013-01-29 ENCOUNTER — Ambulatory Visit (HOSPITAL_COMMUNITY)
Admission: RE | Admit: 2013-01-29 | Discharge: 2013-01-29 | Disposition: A | Discharge status: Home / Self Care | Payer: Medicare Other | Source: Ambulatory Visit | Attending: Cardiovascular Disease | Admitting: Cardiovascular Disease

## 2013-01-29 ENCOUNTER — Ambulatory Visit (INDEPENDENT_AMBULATORY_CARE_PROVIDER_SITE_OTHER): Payer: Medicare Other | Admitting: Pharmacist Clinician (PhC)/ Clinical Pharmacy Specialist

## 2013-01-29 DIAGNOSIS — I4891 Unspecified atrial fibrillation: Secondary | ICD-10-CM

## 2013-01-29 DIAGNOSIS — Z87891 Personal history of nicotine dependence: Secondary | ICD-10-CM | POA: Insufficient documentation

## 2013-01-29 DIAGNOSIS — J449 Chronic obstructive pulmonary disease, unspecified: Secondary | ICD-10-CM | POA: Insufficient documentation

## 2013-01-29 DIAGNOSIS — J4489 Other specified chronic obstructive pulmonary disease: Secondary | ICD-10-CM | POA: Insufficient documentation

## 2013-01-29 DIAGNOSIS — I2510 Atherosclerotic heart disease of native coronary artery without angina pectoris: Secondary | ICD-10-CM

## 2013-01-29 DIAGNOSIS — Z8249 Family history of ischemic heart disease and other diseases of the circulatory system: Secondary | ICD-10-CM | POA: Insufficient documentation

## 2013-01-29 DIAGNOSIS — Z7901 Long term (current) use of anticoagulants: Secondary | ICD-10-CM

## 2013-01-29 DIAGNOSIS — Z951 Presence of aortocoronary bypass graft: Secondary | ICD-10-CM | POA: Insufficient documentation

## 2013-01-29 DIAGNOSIS — E119 Type 2 diabetes mellitus without complications: Secondary | ICD-10-CM | POA: Insufficient documentation

## 2013-01-29 DIAGNOSIS — E663 Overweight: Secondary | ICD-10-CM | POA: Insufficient documentation

## 2013-01-29 DIAGNOSIS — I252 Old myocardial infarction: Secondary | ICD-10-CM | POA: Insufficient documentation

## 2013-01-29 MED ORDER — AMINOPHYLLINE 25 MG/ML IV SOLN
75.0000 mg | Freq: Once | INTRAVENOUS | Status: AC
  Administered 2013-01-29: 75 mg via INTRAVENOUS

## 2013-01-29 MED ORDER — TECHNETIUM TC 99M SESTAMIBI GENERIC - CARDIOLITE
10.3000 | Freq: Once | INTRAVENOUS | Status: AC | PRN
  Administered 2013-01-29: 10 via INTRAVENOUS

## 2013-01-29 MED ORDER — REGADENOSON 0.4 MG/5ML IV SOLN
0.4000 mg | Freq: Once | INTRAVENOUS | Status: AC
  Administered 2013-01-29: 0.4 mg via INTRAVENOUS

## 2013-01-29 MED ORDER — TECHNETIUM TC 99M SESTAMIBI GENERIC - CARDIOLITE
31.0000 | Freq: Once | INTRAVENOUS | Status: AC | PRN
  Administered 2013-01-29: 31 via INTRAVENOUS

## 2013-01-29 NOTE — Procedures (Addendum)
[place] [facility] [address] [place] [address] [place] [address] [phone]  Cardiology Nuclear Med Cheyenne [name] is a 79 y.o. female     MRN : [id number]     DOB: [birth year]  Procedure Date: 01/29/2013  Nuclear Med Background Indication for Stress Test:  Graft Patency and Abnormal EKG History:  COPD and CAD;MI--1999;CABG--1999;A-FIB WITH ABLATION Cardiac Risk Factors: Family History - CAD, History of Smoking, Hypertension, Lipids, NIDDM and Overweight  Symptoms:  PT DENIES SYMPTOMS   Nuclear Pre-Procedure Caffeine/Decaff Intake:  1:00am NPO After: 11AM   IV Site: R Antecubital  IV 0.9% NS with Angio Cath:  22g  Chest Size (in):  N/A IV Started by: Emmit Pomfret, RN  Height: 5' 3.5" (1.613 m)  Cup Size: C  BMI:  Body mass index is 26.67 kg/(m^2). Weight:  153 lb (69.4 kg)   Tech Comments:  N/A    Nuclear Med Study 1 or 2 day study: 1 day  Stress Test Type:  Lexiscan  Order Authorizing Provider:  Nicki Guadalajara, MD   Resting Radionuclide: Technetium 41m Sestamibi  Resting Radionuclide Dose: 10.3 mCi   Stress Radionuclide:  Technetium 21m Sestamibi  Stress Radionuclide Dose: 31.0 mCi           Stress Protocol Rest HR: 54 Stress HR: 70  Rest BP:148/87 Stress BP: 164/76  Exercise Time (min): n/a METS: n/a          Dose of Adenosine (mg):  n/a Dose of Lexiscan: 0.4 mg  Dose of Atropine (mg): n/a Dose of Dobutamine: n/a mcg/kg/min (at max HR)  Stress Test Technologist: Ernestene Mention, CCT Nuclear Technologist: Gonzella Lex, CNMT   Rest Procedure:  Myocardial perfusion imaging was performed at rest 45 minutes following the intravenous administration of Technetium 11m Sestamibi. Stress Procedure:  The patient received IV Lexiscan 0.4 mg over 15-seconds.  Technetium 76m Sestamibi injected at 30-seconds.   Due to patient's shortness of breath and light-headedness, she was given IV Aminophylline 75 mg. Symptoms were resolved during  recovery. There were no significant changes with Lexiscan.  Quantitative spect images were obtained after a 45 minute delay.  Transient Ischemic Dilatation (Normal <1.22):  0.96 Lung/Heart Ratio (Normal <0.45):  0.21 QGS EDV:  63 ml QGS ESV:  17 ml LV Ejection Fraction: 73%  Rest ECG: NSR - Normal EKG  Stress ECG: No significant change from baseline ECG  QPS Raw Data Images:  Normal; no motion artifact; normal heart/lung ratio. Stress Images:  Normal homogeneous uptake in all areas of the myocardium. Rest Images:  Normal homogeneous uptake in all areas of the myocardium. Subtraction (SDS):  No evidence of ischemia.  Impression Exercise Capacity:  Lexiscan with no exercise. BP Response:  Normal blood pressure response. Clinical Symptoms:  There is dyspnea. ECG Impression:  No significant ECG changes with Lexiscan. Comparison with Prior Nuclear Study: No significant change from previous study, there was insignificant reversibility noted on the prior NST, which is no longer present.  Overall Impression:  Normal stress nuclear study.  LV Wall Motion:  NL LV Function; NL Wall Motion; EF 73%.  Chrystie Nose, MD, [facility] Board Certified in Nuclear Cardiology Attending Cardiologist [facility]  Chrystie Nose, MD  01/29/2013 6:31 PM

## 2013-01-30 ENCOUNTER — Encounter (HOSPITAL_COMMUNITY): Payer: Medicare Other

## 2013-01-30 ENCOUNTER — Ambulatory Visit: Payer: Medicare Other | Admitting: Pharmacist Clinician (PhC)/ Clinical Pharmacy Specialist

## 2013-02-04 ENCOUNTER — Other Ambulatory Visit: Payer: Self-pay | Admitting: *Deleted

## 2013-02-04 MED ORDER — DILTIAZEM HCL ER COATED BEADS 120 MG PO CP24: 120.0000 mg | ORAL_CAPSULE | Freq: Every day | ORAL | Status: DC

## 2013-02-24 ENCOUNTER — Encounter: Payer: Self-pay | Admitting: *Deleted

## 2013-02-26 ENCOUNTER — Ambulatory Visit (INDEPENDENT_AMBULATORY_CARE_PROVIDER_SITE_OTHER): Payer: Medicare Other | Admitting: Pharmacist Clinician (PhC)/ Clinical Pharmacy Specialist

## 2013-02-26 VITALS — BP 132/76 | HR 72

## 2013-02-26 DIAGNOSIS — I4891 Unspecified atrial fibrillation: Secondary | ICD-10-CM

## 2013-02-26 DIAGNOSIS — Z7901 Long term (current) use of anticoagulants: Secondary | ICD-10-CM

## 2013-02-26 LAB — POCT INR: INR: 2.8

## 2013-03-05 ENCOUNTER — Ambulatory Visit (INDEPENDENT_AMBULATORY_CARE_PROVIDER_SITE_OTHER): Payer: Medicare Other | Admitting: Cardiovascular Disease

## 2013-03-05 ENCOUNTER — Encounter: Payer: Self-pay | Admitting: Cardiovascular Disease

## 2013-03-05 VITALS — BP 148/86 | HR 63 | Ht 62.0 in | Wt 152.5 lb

## 2013-03-05 DIAGNOSIS — I2510 Atherosclerotic heart disease of native coronary artery without angina pectoris: Secondary | ICD-10-CM

## 2013-03-05 DIAGNOSIS — E782 Mixed hyperlipidemia: Secondary | ICD-10-CM

## 2013-03-05 DIAGNOSIS — E119 Type 2 diabetes mellitus without complications: Secondary | ICD-10-CM

## 2013-03-05 DIAGNOSIS — I4892 Unspecified atrial flutter: Secondary | ICD-10-CM

## 2013-03-05 DIAGNOSIS — I10 Essential (primary) hypertension: Secondary | ICD-10-CM

## 2013-03-05 DIAGNOSIS — E785 Hyperlipidemia, unspecified: Secondary | ICD-10-CM

## 2013-03-05 NOTE — Progress Notes (Signed)
Patient ID: Cheyenne [name], female   DOB: 05-08-1925, 79 y.o.   MRN: [id number]     HPI: Cheyenne [name], is a 79 y.o. female who presents to the office today for six-month cardiology evaluation. Cheyenne [name] has known coronary artery disease. She suffered an injury wall myocardial infarction in 1997 treated with PTCA of her right current artery per and in 1999 underwent CABG surgery. She has a remote history of atrial flutter status post atrial flutter ablation. She also is a history of paroxysmal atrial fibrillation. She was last hospitalized with PAF in July 2013. She also has a history of hyperlipidemia, hypertension, as well as GERD per I last saw her 6 months ago. Since that time, she denies any anginal symptomatology. She is unaware of any breakthrough tachycardia palpitations. She denies significant shortness of breath. On 01/29/2013 she underwent a 2 year follow-up nuclear study. This continued to show normal perfusion. Post-stress ejection fraction was 73%. There is no evidence for scar or ischemia.  Past Medical History  Diagnosis Date  . Atrial fibrillation   . Coronary artery disease   . COPD (chronic obstructive pulmonary disease)   . Hypertension 11/04/2009    renal doppler showed abd aorta normal size  low velocities  w/ moderate amt plaque; rgt & lft kidney  normal size,renal arteries normal patency  . Anxiety   . Pneumonia     5-6 years ago  . Arthritis   . Myocardial infarction     Mild MI 1987,inferior wall  . Paroxysmal atrial fibrillation     last hospitalized July 2013  . Hyperlipidemia   . PVD (peripheral vascular disease) 09/18/2011    carotid doppler showed mild amount of fibrous plaque both carotid arteries w/o significant flow reduction or stenosis  . PVD (peripheral vascular disease) 09/18/2011    Lower extremity doppler showed ABIof 0.97 on the right,0.83 on the left.,50-60%narrowing rgt common iliac greater than 70% right SFA, right posterior tibial & peroneal arteries  occlude  . PVD (peripheral vascular disease) 09/14/2010    ABI's in 2012 were 0.94 and 0.81 but velocities appeared more sigificantly changed  . PVD (peripheral vascular disease) with claudication 09/15/2007    abd aotra moder. amt plaque w/low velocities, left SFA occluded vessel, rgt prox. & mid SFA elvated  velocities ABI rgt O.87,ABI  lft 0.77   . Peripheral angiopathy 03/21/2004    total left SFA w/three vessel runoff though tibial vessels are diffusely disease tx medically,left 70% distal lft external iliac,70% rgt common iliac, 50-60% prox and mid rgt SFA ,    Past Surgical History  Procedure Laterality Date  . Nasal sinus surgery    . Quadruple  bypass    . Cholecystectomy    . Eye surgery      bilateral catracts  . Coronary angioplasty  1987    emergent,60% LAD  . Coronary artery bypass graft  03/23/1998    CABGx4 left internal mammary artery to LAD, SVG to diagonal branch to LAD, first obtuse branch Left Circ,and posterior descending branch of RCA  . Doppler echocardiography  05/29/2012    proximal septal thickening w/normal systolic and probable grade 2 diastolic dysfunction,mod LA dilatation, mild  annilar calcifcation w/mild to mod MR, mild to mod.TR , mild pulm. HTN w/upper normal pulm. pressures. mild aortic sclerosis w/o stenosis and pulm.valvular regrurgitation  . Doppler echocardiography  04/16/2011    EF =>55%, LV normal,Grade II diastolic dysfunction, aortic valve moderately sclerotic  . Doppler echocardiography  06/16/2009  EF => 55%,LV normal ,mild to moder. mitral annular calcifcation,no aortic stenosis, aortic valve appears moder.sclerotic  . Cardiac stress test  11/09/2010    EF 75%, small area of reversible lateral ischemia. SDS 2 . Extent 3%  . Cardiac stress test  11/04/2009    EF 75%, LV normal ,   . Cardiac catheterization  03/22/1998    severe four vessel coronary disease normal LV function  . Atrial flutter ablation  11/11/1996    Dr Graciela Husbands did  RFablation atrial flutter  and sinus node re-entry tachycardia    Allergies  Allergen Reactions  . Elavil (Amitriptyline) Other (See Comments)    Felt drunk  . Nifedipine Other (See Comments)    Heart races  . Tegretol (Carbamazepine) Other (See Comments)    Felt drunk    Current Outpatient Prescriptions  Medication Sig Dispense Refill  . acetaminophen (TYLENOL) 500 MG tablet Take 1,000 mg by mouth every 6 (six) hours as needed. For pain      . ALPRAZolam (XANAX) 0.25 MG tablet Take 0.25 mg by mouth 2 (two) times daily as needed. For anxiety      . atorvastatin (LIPITOR) 80 MG tablet Take 80 mg by mouth every evening.      . calcium carbonate (OS-CAL) 600 MG TABS Take 600 mg by mouth 2 (two) times daily with a meal.      . cilostazol (PLETAL) 100 MG tablet Take 100 mg by mouth 2 (two) times daily.      Marland Kitchen diltiazem (CARDIZEM CD) 120 MG 24 hr capsule Take 1 capsule (120 mg total) by mouth daily.  90 capsule  3  . esomeprazole (NEXIUM) 40 MG packet Take 40 mg by mouth daily before breakfast.       . ezetimibe (ZETIA) 10 MG tablet Take 10 mg by mouth every evening.       . fluticasone (FLONASE) 50 MCG/ACT nasal spray Place 2 sprays into the nose daily.      . furosemide (LASIX) 20 MG tablet Take 20 mg by mouth every other day.      . hydrochlorothiazide (HYDRODIURIL) 25 MG tablet Take 25 mg by mouth daily.      Marland Kitchen HYDROcodone-acetaminophen (NORCO/VICODIN) 5-325 MG per tablet Take 1 tablet by mouth every 6 (six) hours as needed. For pain      . isosorbide mononitrate (IMDUR) 30 MG 24 hr tablet Take 30 mg by mouth daily.      . Levocetirizine Dihydrochloride (XYZAL PO) Take 10 mg by mouth every evening.      . metoprolol succinate (TOPROL-XL) 100 MG 24 hr tablet Take 100 mg by mouth daily. Take with or immediately following a meal.      . Omega-3 Fatty Acids (FISH OIL) 1200 MG CAPS Take 1 capsule by mouth 2 (two) times daily.      Letta Pate DELICA LANCETS 33G MISC       . ONETOUCH VERIO  test strip       . polyethylene glycol powder (GLYCOLAX/MIRALAX) powder Take 17 g by mouth daily.      . potassium chloride SA (K-DUR,KLOR-CON) 20 MEQ tablet Take 20 mEq by mouth daily.      . psyllium (METAMUCIL) 58.6 % powder Take 1 packet by mouth daily. At lunch or supper      . ramipril (ALTACE) 10 MG tablet Take 10 mg by mouth daily.      . Tiotropium Bromide Monohydrate (SPIRIVA HANDIHALER IN) Inhale 1 capsule into the lungs  daily.       . warfarin (COUMADIN) 5 MG tablet Take 5 mg by mouth every evening.      Marland Kitchen amiodarone (PACERONE) 400 MG tablet Take 200 mg by mouth daily.       No current facility-administered medications for this visit.    Socially she is widowed for 4 years. She has 3 children and one grandchild. She has a remote tobacco history but quit in 1999. There is no alcohol use.  ROS is negative for fevers, chills or night sweats. She denies PND or orthopnea. She denies tachycardia palpitations. She denies chest pressure. There is no abdominal pain. She denies bleeding. There are no GU symptoms. She denies significant edema.   Other system review is negative.  PE BP 148/86  Pulse 63  Ht 5\' 2"  (1.575 m)  Wt 152 lb 8 oz (69.174 kg)  BMI 27.89 kg/m2  General: Alert, oriented, no distress.  Skin: normal turgor, no rashes HEENT: Normocephalic, atraumatic. Pupils round and reactive; sclera anicteric;no lid lag.  Nose without nasal septal hypertrophy Mouth/Parynx benign; Mallinpatti scale** Neck: No JVD, no carotid briuts Lungs: clear to ausculatation and percussion; no wheezing or rales Heart: RRR, s1 s2 normal  Abdomen: soft, nontender; no hepatosplenomehaly, BS+; abdominal aorta nontender and not dilated by palpation. Pulses 2+ Extremities: no clubbing cyanosis or edema, Homan's sign negative  Neurologic: grossly nonfocal  ECG: Sinus rhythm at 63 beats per minute. Borderline first-degree block. Mild RV conduction  LABS:  BMET    Component Value Date/Time   NA  140 02/25/2012 0635   K 3.8 02/25/2012 0635   CL 103 02/25/2012 0635   CO2 27 02/25/2012 0635   GLUCOSE 115* 02/25/2012 0635   BUN 11 02/25/2012 0635   CREATININE 0.82 02/25/2012 0635   CALCIUM 9.0 02/25/2012 0635   GFRNONAA 63* 02/25/2012 0635   GFRAA 74* 02/25/2012 0635     Hepatic Function Panel     Component Value Date/Time   PROT 7.3 04/24/2010 1214   ALBUMIN 3.8 04/24/2010 1214   AST 28 04/24/2010 1214   ALT 30 04/24/2010 1214   ALKPHOS 123* 04/24/2010 1214   BILITOT 0.7 04/24/2010 1214   BILIDIR 0.2 02/20/2010 1139     CBC    Component Value Date/Time   WBC 8.6 02/25/2012 0635   RBC 4.25 02/25/2012 0635   HGB 13.0 02/25/2012 0635   HCT 38.9 02/25/2012 0635   PLT 276 02/25/2012 0635   MCV 91.5 02/25/2012 0635   MCH 30.6 02/25/2012 0635   MCHC 33.4 02/25/2012 0635   RDW 12.5 02/25/2012 0635   LYMPHSABS 1.7 02/24/2012 1357   MONOABS 1.2* 02/24/2012 1357   EOSABS 0.1 02/24/2012 1357   BASOSABS 0.0 02/24/2012 1357     BNP No results found for this basename: probnp    Lipid Panel  No results found for this basename: chol, trig, hdl, cholhdl, vldl, ldlcalc     RADIOLOGY: No results found.    ASSESSMENT AND PLAN: Cheyenne [name] continues to do well, now 17 years following her inferior wall myocardial infarction treated with PTCA the right coronary artery and 15 years status post successful CABG revascularization surgery. Arose most recent nuclear perfusion study is unchanged from 2 years previously. She has not had any laboratory in over a year. I will check a complete set of blood work. Adjustments will be made for medications if necessary depending upon the laboratory results. As long as she remains stable I will see her in 6  months for cardiology reevaluation.      Lennette Bihari, MD, [facility]  03/05/2013 8:12 PM

## 2013-03-05 NOTE — Patient Instructions (Addendum)
Your physician recommends that you return for lab work fasting.  Your physician recommends that you schedule a follow-up appointment in: 6 MONTHS.  No changes has been made today.

## 2013-03-10 LAB — CBC
Hemoglobin: 14 g/dL (ref 12.0–15.0)
MCH: 30.8 pg (ref 26.0–34.0)
MCHC: 34.1 g/dL (ref 30.0–36.0)
MCV: 90.3 fL (ref 78.0–100.0)

## 2013-03-10 LAB — COMPREHENSIVE METABOLIC PANEL
Alkaline Phosphatase: 72 U/L (ref 39–117)
Creat: 1.1 mg/dL (ref 0.50–1.10)
Glucose, Bld: 118 mg/dL — ABNORMAL HIGH (ref 70–99)
Sodium: 137 mEq/L (ref 135–145)
Total Bilirubin: 0.7 mg/dL (ref 0.3–1.2)
Total Protein: 6.8 g/dL (ref 6.0–8.3)

## 2013-03-11 LAB — NMR LIPOPROFILE WITH LIPIDS
Cholesterol, Total: 110 mg/dL (ref <200)
HDL Size: 10 nm (ref >=9.2)
LDL Particle Number: 686 nmol/L (ref <1000)
Large HDL-P: 11.2 umol/L (ref >=4.8)
Large VLDL-P: 1.1 nmol/L (ref <=2.7)
Small LDL Particle Number: 425 nmol/L (ref <=527)
VLDL Size: 46.6 nm (ref <=46.6)

## 2013-03-23 ENCOUNTER — Encounter: Payer: Self-pay | Admitting: *Deleted

## 2013-03-23 NOTE — Progress Notes (Signed)
Quick Note:  Results sent Patient. ______

## 2013-03-23 NOTE — Progress Notes (Signed)
Quick Note:  Note sent to patient ______ 

## 2013-03-26 ENCOUNTER — Ambulatory Visit (INDEPENDENT_AMBULATORY_CARE_PROVIDER_SITE_OTHER): Payer: Medicare Other | Admitting: Pharmacist Clinician (PhC)/ Clinical Pharmacy Specialist

## 2013-03-26 VITALS — BP 156/74 | HR 76

## 2013-03-26 DIAGNOSIS — I4891 Unspecified atrial fibrillation: Secondary | ICD-10-CM

## 2013-03-26 DIAGNOSIS — Z7901 Long term (current) use of anticoagulants: Secondary | ICD-10-CM

## 2013-04-24 ENCOUNTER — Ambulatory Visit: Payer: Medicare Other | Admitting: Pharmacist Clinician (PhC)/ Clinical Pharmacy Specialist

## 2013-04-27 ENCOUNTER — Ambulatory Visit (INDEPENDENT_AMBULATORY_CARE_PROVIDER_SITE_OTHER): Payer: Medicare Other | Admitting: Pharmacist Clinician (PhC)/ Clinical Pharmacy Specialist

## 2013-04-27 VITALS — BP 142/60 | HR 60

## 2013-04-27 DIAGNOSIS — Z7901 Long term (current) use of anticoagulants: Secondary | ICD-10-CM

## 2013-04-27 DIAGNOSIS — I4891 Unspecified atrial fibrillation: Secondary | ICD-10-CM

## 2013-04-27 LAB — POCT INR: INR: 3.6

## 2013-05-11 ENCOUNTER — Telehealth: Payer: Self-pay | Admitting: Cardiovascular Disease

## 2013-05-11 ENCOUNTER — Ambulatory Visit (INDEPENDENT_AMBULATORY_CARE_PROVIDER_SITE_OTHER): Payer: Medicare Other | Admitting: Pharmacist Clinician (PhC)/ Clinical Pharmacy Specialist

## 2013-05-11 VITALS — BP 118/62 | HR 120

## 2013-05-11 DIAGNOSIS — I4891 Unspecified atrial fibrillation: Secondary | ICD-10-CM

## 2013-05-11 DIAGNOSIS — Z7901 Long term (current) use of anticoagulants: Secondary | ICD-10-CM

## 2013-05-11 LAB — POCT INR: INR: 3.2

## 2013-05-11 NOTE — Telephone Encounter (Signed)
Says she has been in AFib since got home today from Coumadin appt  Please call re med dr Tresa Endo instructed her to take when went in AFib

## 2013-05-11 NOTE — Telephone Encounter (Signed)
States she went into atrial fib after leaving here this AM having her INR done.  She took an extra 50mg  Metoprolol about 12 noon and will take another 50mg  (Dr. Tresa Endo told her to do this if she went into A.Fib).  If this does not break the afib, with heart rates of 90's to 120's, she is instructed to call EMS to go to the ER.  Patient voiced understanding.

## 2013-05-12 ENCOUNTER — Encounter (HOSPITAL_COMMUNITY): Payer: Self-pay | Admitting: Emergency Medicine

## 2013-05-12 ENCOUNTER — Emergency Department (HOSPITAL_COMMUNITY): Payer: Medicare Other

## 2013-05-12 ENCOUNTER — Emergency Department (HOSPITAL_COMMUNITY)
Admission: EM | Admit: 2013-05-12 | Discharge: 2013-05-12 | Disposition: A | Discharge status: Home / Self Care | Payer: Medicare Other | Attending: Emergency Medicine | Admitting: Emergency Medicine

## 2013-05-12 DIAGNOSIS — Z9861 Coronary angioplasty status: Secondary | ICD-10-CM | POA: Insufficient documentation

## 2013-05-12 DIAGNOSIS — I10 Essential (primary) hypertension: Secondary | ICD-10-CM | POA: Insufficient documentation

## 2013-05-12 DIAGNOSIS — Z951 Presence of aortocoronary bypass graft: Secondary | ICD-10-CM | POA: Insufficient documentation

## 2013-05-12 DIAGNOSIS — Z79899 Other long term (current) drug therapy: Secondary | ICD-10-CM | POA: Insufficient documentation

## 2013-05-12 DIAGNOSIS — J449 Chronic obstructive pulmonary disease, unspecified: Secondary | ICD-10-CM | POA: Insufficient documentation

## 2013-05-12 DIAGNOSIS — I252 Old myocardial infarction: Secondary | ICD-10-CM | POA: Insufficient documentation

## 2013-05-12 DIAGNOSIS — Z87891 Personal history of nicotine dependence: Secondary | ICD-10-CM | POA: Insufficient documentation

## 2013-05-12 DIAGNOSIS — IMO0002 Reserved for concepts with insufficient information to code with codable children: Secondary | ICD-10-CM | POA: Insufficient documentation

## 2013-05-12 DIAGNOSIS — Z7901 Long term (current) use of anticoagulants: Secondary | ICD-10-CM

## 2013-05-12 DIAGNOSIS — J4489 Other specified chronic obstructive pulmonary disease: Secondary | ICD-10-CM | POA: Insufficient documentation

## 2013-05-12 DIAGNOSIS — Z8739 Personal history of other diseases of the musculoskeletal system and connective tissue: Secondary | ICD-10-CM | POA: Insufficient documentation

## 2013-05-12 DIAGNOSIS — Z8701 Personal history of pneumonia (recurrent): Secondary | ICD-10-CM | POA: Insufficient documentation

## 2013-05-12 DIAGNOSIS — E785 Hyperlipidemia, unspecified: Secondary | ICD-10-CM | POA: Insufficient documentation

## 2013-05-12 DIAGNOSIS — F411 Generalized anxiety disorder: Secondary | ICD-10-CM

## 2013-05-12 DIAGNOSIS — I4891 Unspecified atrial fibrillation: Secondary | ICD-10-CM | POA: Insufficient documentation

## 2013-05-12 DIAGNOSIS — I2510 Atherosclerotic heart disease of native coronary artery without angina pectoris: Secondary | ICD-10-CM | POA: Diagnosis present

## 2013-05-12 LAB — URINALYSIS, ROUTINE W REFLEX MICROSCOPIC
Glucose, UA: NEGATIVE mg/dL
Hgb urine dipstick: NEGATIVE
Leukocytes, UA: NEGATIVE
Protein, ur: NEGATIVE mg/dL
Specific Gravity, Urine: 1.011 (ref 1.005–1.030)
Urobilinogen, UA: 0.2 mg/dL (ref 0.0–1.0)

## 2013-05-12 LAB — CBC WITH DIFFERENTIAL/PLATELET
Hemoglobin: 13.4 g/dL (ref 12.0–15.0)
Lymphocytes Relative: 12 % (ref 12–46)
Lymphs Abs: 0.9 10*3/uL (ref 0.7–4.0)
MCH: 29.6 pg (ref 26.0–34.0)
Monocytes Relative: 12 % (ref 3–12)
Neutro Abs: 6.1 10*3/uL (ref 1.7–7.7)
Neutrophils Relative %: 76 % (ref 43–77)
Platelets: 225 10*3/uL (ref 150–400)
RBC: 4.52 MIL/uL (ref 3.87–5.11)
WBC: 8 10*3/uL (ref 4.0–10.5)

## 2013-05-12 LAB — MAGNESIUM: Magnesium: 1.9 mg/dL (ref 1.5–2.5)

## 2013-05-12 LAB — BASIC METABOLIC PANEL
BUN: 20 mg/dL (ref 6–23)
CO2: 28 mEq/L (ref 19–32)
Chloride: 96 mEq/L (ref 96–112)
Glucose, Bld: 134 mg/dL — ABNORMAL HIGH (ref 70–99)
Potassium: 3.3 mEq/L — ABNORMAL LOW (ref 3.5–5.1)
Sodium: 134 mEq/L — ABNORMAL LOW (ref 135–145)

## 2013-05-12 LAB — PROTIME-INR: Prothrombin Time: 30.5 seconds — ABNORMAL HIGH (ref 11.6–15.2)

## 2013-05-12 LAB — APTT: aPTT: 50 seconds — ABNORMAL HIGH (ref 24–37)

## 2013-05-12 MED ORDER — AMIODARONE HCL 200 MG PO TABS: 200.0000 mg | ORAL_TABLET | Freq: Two times a day (BID) | ORAL | Status: DC

## 2013-05-12 MED ORDER — SODIUM CHLORIDE 0.9 % IV BOLUS (SEPSIS)
1000.0000 mL | Freq: Once | INTRAVENOUS | Status: AC
  Administered 2013-05-12: 1000 mL via INTRAVENOUS

## 2013-05-12 MED ORDER — POTASSIUM CHLORIDE CRYS ER 20 MEQ PO TBCR
40.0000 meq | EXTENDED_RELEASE_TABLET | Freq: Once | ORAL | Status: AC
  Administered 2013-05-12: 40 meq via ORAL
  Filled 2013-05-12: qty 2

## 2013-05-12 NOTE — ED Notes (Signed)
Pt ambulated to rsetroom.

## 2013-05-12 NOTE — ED Provider Notes (Addendum)
CSN: [id number]     Arrival date & time 05/12/13  0808 History   First MD Initiated Contact with Patient 05/12/13 [phone]     Chief Complaint  Patient presents with  . Palpitations   (Consider location/radiation/quality/duration/timing/severity/associated sxs/prior Treatment) HPI Comments: Pt comes in with cc of palpitations. Hx of afib, on coumadin and metoprolol and diltiazem and amiodarone. Pt states that she was having palpitations at home y;day, HR fluctuating, going as high as 140s. She called Cards, and tey asked her to take 50 mg metoprolol x 2 separate times. Her HR improved slightly, and is mainly in the 70-90s now, but she still has occasional palpitations, which prompted her to come to the ED. Pt denies nausea, emesis, fevers, chills, chest pains, shortness of breath, headaches, abdominal pain, uti like symptoms, dizziness. No new meds/changes to meds.   Patient is a 79 y.o. female presenting with palpitations. The history is provided by the patient.  Palpitations Associated symptoms: no chest pain, no cough, no nausea, no shortness of breath and no vomiting     Past Medical History  Diagnosis Date  . Atrial fibrillation   . Coronary artery disease   . COPD (chronic obstructive pulmonary disease)   . Hypertension 11/04/2009    renal doppler showed abd aorta normal size  low velocities  w/ moderate amt plaque; rgt & lft kidney  normal size,renal arteries normal patency  . Anxiety   . Pneumonia     5-6 years ago  . Arthritis   . Myocardial infarction     Mild MI 1987,inferior wall  . Paroxysmal atrial fibrillation     last hospitalized July 2013  . Hyperlipidemia   . PVD (peripheral vascular disease) 09/18/2011    carotid doppler showed mild amount of fibrous plaque both carotid arteries w/o significant flow reduction or stenosis  . PVD (peripheral vascular disease) 09/18/2011    Lower extremity doppler showed ABIof 0.97 on the right,0.83 on the left.,50-60%narrowing rgt  common iliac greater than 70% right SFA, right posterior tibial & peroneal arteries occlude  . PVD (peripheral vascular disease) 09/14/2010    ABI's in 2012 were 0.94 and 0.81 but velocities appeared more sigificantly changed  . PVD (peripheral vascular disease) with claudication 09/15/2007    abd aotra moder. amt plaque w/low velocities, left SFA occluded vessel, rgt prox. & mid SFA elvated  velocities ABI rgt O.87,ABI  lft 0.77   . Peripheral angiopathy 03/21/2004    total left SFA w/three vessel runoff though tibial vessels are diffusely disease tx medically,left 70% distal lft external iliac,70% rgt common iliac, 50-60% prox and mid rgt SFA ,   Past Surgical History  Procedure Laterality Date  . Nasal sinus surgery    . Quadruple  bypass    . Cholecystectomy    . Eye surgery      bilateral catracts  . Coronary angioplasty  1987    emergent,60% LAD  . Coronary artery bypass graft  03/23/1998    CABGx4 left internal mammary artery to LAD, SVG to diagonal branch to LAD, first obtuse branch Left Circ,and posterior descending branch of RCA  . Doppler echocardiography  05/29/2012    proximal septal thickening w/normal systolic and probable grade 2 diastolic dysfunction,mod LA dilatation, mild  annilar calcifcation w/mild to mod MR, mild to mod.TR , mild pulm. HTN w/upper normal pulm. pressures. mild aortic sclerosis w/o stenosis and pulm.valvular regrurgitation  . Doppler echocardiography  04/16/2011    EF =>55%, LV normal,Grade II diastolic dysfunction,  aortic valve moderately sclerotic  . Doppler echocardiography  06/16/2009    EF => 55%,LV normal ,mild to moder. mitral annular calcifcation,no aortic stenosis, aortic valve appears moder.sclerotic  . Cardiac stress test  11/09/2010    EF 75%, small area of reversible lateral ischemia. SDS 2 . Extent 3%  . Cardiac stress test  11/04/2009    EF 75%, LV normal ,   . Cardiac catheterization  03/22/1998    severe four vessel coronary disease  normal LV function  . Atrial flutter ablation  11/11/1996    Dr Graciela Husbands did RFablation atrial flutter  and sinus node re-entry tachycardia   Family History  Problem Relation Age of Onset  . Heart disease Father   . Breast cancer Paternal Aunt    History  Substance Use Topics  . Smoking status: Former Smoker    Types: Cigarettes  . Smokeless tobacco: Never Used     Comment: quit smoking 14 years ago.  . Alcohol Use: No   OB History   Grav Para Term Preterm Abortions TAB SAB Ect Mult Living   3 3        3      Review of Systems  Constitutional: Negative for activity change.  HENT: Negative for facial swelling and neck pain.   Respiratory: Negative for cough, shortness of breath and wheezing.   Cardiovascular: Positive for palpitations. Negative for chest pain.  Gastrointestinal: Negative for nausea, vomiting, abdominal pain, diarrhea, constipation, blood in stool and abdominal distention.  Genitourinary: Negative for hematuria and difficulty urinating.  Skin: Negative for color change.  Neurological: Negative for speech difficulty.  Hematological: Does not bruise/bleed easily.  Psychiatric/Behavioral: Negative for confusion.    Allergies  Elavil; Nifedipine; and Tegretol  Home Medications   Current Outpatient Rx  Name  Route  Sig  Dispense  Refill  . acetaminophen (TYLENOL) 500 MG tablet   Oral   Take 1,000 mg by mouth every 6 (six) hours as needed. For pain         . ALPRAZolam (XANAX) 0.25 MG tablet   Oral   Take 0.25 mg by mouth 2 (two) times daily as needed for anxiety. For anxiety         . amiodarone (PACERONE) 200 MG tablet   Oral   Take 200 mg by mouth daily.         Marland Kitchen atorvastatin (LIPITOR) 80 MG tablet   Oral   Take 80 mg by mouth every evening.         . calcium carbonate (OS-CAL) 600 MG TABS   Oral   Take 600 mg by mouth 2 (two) times daily with a meal.         . cilostazol (PLETAL) 100 MG tablet   Oral   Take 100 mg by mouth 2 (two)  times daily.         Marland Kitchen diltiazem (CARDIZEM CD) 120 MG 24 hr capsule   Oral   Take 1 capsule (120 mg total) by mouth daily.   90 capsule   3   . esomeprazole (NEXIUM) 40 MG packet   Oral   Take 40 mg by mouth daily before breakfast.          . ezetimibe (ZETIA) 10 MG tablet   Oral   Take 10 mg by mouth every evening.          . fluticasone (FLONASE) 50 MCG/ACT nasal spray   Nasal   Place 2 sprays into the nose daily.         Marland Kitchen  furosemide (LASIX) 20 MG tablet   Oral   Take 20 mg by mouth every other day.         . hydrochlorothiazide (HYDRODIURIL) 25 MG tablet   Oral   Take 25 mg by mouth daily.         Marland Kitchen HYDROcodone-acetaminophen (NORCO/VICODIN) 5-325 MG per tablet   Oral   Take 1 tablet by mouth every 6 (six) hours as needed. For pain         . isosorbide mononitrate (IMDUR) 30 MG 24 hr tablet   Oral   Take 30 mg by mouth daily.         . Levocetirizine Dihydrochloride (XYZAL PO)   Oral   Take 10 mg by mouth every evening.         . metoprolol succinate (TOPROL-XL) 100 MG 24 hr tablet   Oral   Take 100 mg by mouth daily. Take with or immediately following a meal.         . Omega-3 Fatty Acids (FISH OIL) 1200 MG CAPS   Oral   Take 1 capsule by mouth 2 (two) times daily.         . potassium chloride SA (K-DUR,KLOR-CON) 20 MEQ tablet   Oral   Take 20 mEq by mouth daily.         . psyllium (METAMUCIL) 58.6 % powder   Oral   Take 1 packet by mouth daily. At lunch or supper         . ramipril (ALTACE) 10 MG tablet   Oral   Take 10 mg by mouth daily.         . Tiotropium Bromide Monohydrate (SPIRIVA HANDIHALER IN)   Inhalation   Inhale 1 capsule into the lungs daily as needed.          . warfarin (COUMADIN) 5 MG tablet   Oral   Take 2.5-5 mg by mouth every evening. Take 5mg  on Wednesday and Friday. Take 2.5mg  the rest of the week.         Letta Pate DELICA LANCETS 33G MISC               . ONETOUCH VERIO test strip                 BP 95/80  Pulse 84  Temp(Src) 97.2 F (36.2 C) (Oral)  Resp 18  Ht 5\' 2"  (1.575 m)  Wt 150 lb (68.04 kg)  BMI 27.43 kg/m2  SpO2 95% Physical Exam  Nursing note and vitals reviewed. Constitutional: She is oriented to person, place, and time. She appears well-developed and well-nourished.  HENT:  Head: Normocephalic and atraumatic.  Eyes: EOM are normal. Pupils are equal, round, and reactive to light.  Neck: Neck supple.  Cardiovascular: Normal rate and normal heart sounds.   No murmur heard. Pulmonary/Chest: Effort normal. No respiratory distress.  Abdominal: Soft. She exhibits no distension. There is no tenderness. There is no rebound and no guarding.  Neurological: She is alert and oriented to person, place, and time.  Skin: Skin is warm and dry.    ED Course  Procedures (including critical care time) Labs Review Labs Reviewed  BASIC METABOLIC PANEL - Abnormal; Notable for the following:    Sodium 134 (*)    Potassium 3.3 (*)    Glucose, Bld 134 (*)    GFR calc non Af Amer 45 (*)    GFR calc Af Amer 52 (*)    All other components within  normal limits  CBC WITH DIFFERENTIAL  TROPONIN I  URINALYSIS, ROUTINE W REFLEX MICROSCOPIC  MAGNESIUM  PROTIME-INR  APTT   Imaging Review No results found.  MDM  No diagnosis found.   Date: 05/12/2013  Rate: 94  Rhythm: atrial fibrillation  QRS Axis: LAD  Intervals: normal  ST/T Wave abnormalities: nonspecific ST/T changes  Conduction Disutrbances:none  Narrative Interpretation:   Old EKG Reviewed: unchanged  Pt with hx of afib comes in with cc of palpitations. Pt's vitals are stable here, although she has afib, HR is controlled. She is asymptomatic. Pt's hx and exam not suggestive of any source of infection. Labs are WNL. No stimulant use, no new meds.  I have spoken to Cardiology - requesting f/u with them, and to see if they want to make any changes to her meds.     Derwood Kaplan, MD 05/12/13  1044  12:02 PM Cards to change amio, and see her in the clinic soon.  Derwood Kaplan, MD 05/12/13 [phone]

## 2013-05-12 NOTE — ED Notes (Signed)
Family at bedside. 

## 2013-05-12 NOTE — ED Notes (Signed)
Pt here for palpitations and a fib, onset yesterday by pharmacy who took pulse, took metoprolol per dr Tresa Endo her cardiologist. Hr 80 per patient.

## 2013-05-12 NOTE — ED Notes (Signed)
Pt sts hx of afib and sts having palpitations starting last night; pt sts took extra metoprolol but still having palpitations; pt sts some fatigue but no CP or SOB

## 2013-05-12 NOTE — ED Notes (Signed)
Vital signs stable. 

## 2013-05-12 NOTE — Consult Note (Signed)
Chief Complaint: Fast Heart Rate  HPI: The patient is a 79 y/o female, followed by Dr. Tresa Endo. She has known coronary artery disease. She suffered an injury wall myocardial infarction in 1997, treated with PTCA of her RCA and in 1999 underwent CABG surgery. On 01/29/2013 she underwent a 2 year follow-up nuclear study. This continued to show normal perfusion. Post-stress ejection fraction was 73%. There was no evidence for scar or ischemia.  She has a remote history of atrial flutter, status post atrial flutter ablation. She also has a history of paroxysmal atrial fibrillation. She was last hospitalized with PAF in July 2013. She is on Amiodarone, Cardizem and Lopressor. She is on chronic anticoagulation with Warfarin for stroke prophylaxis. Her INR is checked periodically at our office. She also has a history of hyperlipidemia, hypertension, as well as GERD.   She presented to the [facility] ER today with a complaint of tachycardia. She first noticed this yesterday. She checked her pulse and it was elevated in the 120's and felt irregular. She contacted our office and was instructed to take an additional dose of her Lopressor. Her HR improved some, but she continued to be elevated and irregular, prompting her to come to the ER for evaluation.  She denies palpitations, CP, SOB, lightheadedness/dizziness.  Past Medical History  Diagnosis Date  . Atrial fibrillation   . Coronary artery disease   . COPD (chronic obstructive pulmonary disease)   . Hypertension 11/04/2009    renal doppler showed abd aorta normal size  low velocities  w/ moderate amt plaque; rgt & lft kidney  normal size,renal arteries normal patency  . Anxiety   . Pneumonia     5-6 years ago  . Arthritis   . Myocardial infarction     Mild MI 1987,inferior wall  . Paroxysmal atrial fibrillation     last hospitalized July 2013  . Hyperlipidemia   . PVD (peripheral vascular disease) 09/18/2011    carotid doppler showed mild amount of fibrous  plaque both carotid arteries w/o significant flow reduction or stenosis  . PVD (peripheral vascular disease) 09/18/2011    Lower extremity doppler showed ABIof 0.97 on the right,0.83 on the left.,50-60%narrowing rgt common iliac greater than 70% right SFA, right posterior tibial & peroneal arteries occlude  . PVD (peripheral vascular disease) 09/14/2010    ABI's in 2012 were 0.94 and 0.81 but velocities appeared more sigificantly changed  . PVD (peripheral vascular disease) with claudication 09/15/2007    abd aotra moder. amt plaque w/low velocities, left SFA occluded vessel, rgt prox. & mid SFA elvated  velocities ABI rgt O.87,ABI  lft 0.77   . Peripheral angiopathy 03/21/2004    total left SFA w/three vessel runoff though tibial vessels are diffusely disease tx medically,left 70% distal lft external iliac,70% rgt common iliac, 50-60% prox and mid rgt SFA ,    Past Surgical History  Procedure Laterality Date  . Nasal sinus surgery    . Quadruple  bypass    . Cholecystectomy    . Eye surgery      bilateral catracts  . Coronary angioplasty  1987    emergent,60% LAD  . Coronary artery bypass graft  03/23/1998    CABGx4 left internal mammary artery to LAD, SVG to diagonal branch to LAD, first obtuse branch Left Circ,and posterior descending branch of RCA  . Doppler echocardiography  05/29/2012    proximal septal thickening w/normal systolic and probable grade 2 diastolic dysfunction,mod LA dilatation, mild  annilar calcifcation w/mild to  mod MR, mild to mod.TR , mild pulm. HTN w/upper normal pulm. pressures. mild aortic sclerosis w/o stenosis and pulm.valvular regrurgitation  . Doppler echocardiography  04/16/2011    EF =>55%, LV normal,Grade II diastolic dysfunction, aortic valve moderately sclerotic  . Doppler echocardiography  06/16/2009    EF => 55%,LV normal ,mild to moder. mitral annular calcifcation,no aortic stenosis, aortic valve appears moder.sclerotic  . Cardiac stress test   11/09/2010    EF 75%, small area of reversible lateral ischemia. SDS 2 . Extent 3%  . Cardiac stress test  11/04/2009    EF 75%, LV normal ,   . Cardiac catheterization  03/22/1998    severe four vessel coronary disease normal LV function  . Atrial flutter ablation  11/11/1996    Dr Graciela Husbands did RFablation atrial flutter  and sinus node re-entry tachycardia    Family History  Problem Relation Age of Onset  . Heart disease Father   . Breast cancer Paternal Aunt    Social History:  reports that she has quit smoking. Her smoking use included Cigarettes. She smoked 0.00 packs per day. She has never used smokeless tobacco. She reports that she does not drink alcohol or use illicit drugs.  Allergies:  Allergies  Allergen Reactions  . Elavil [Amitriptyline] Other (See Comments)    Felt drunk  . Nifedipine Other (See Comments)    Heart races  . Tegretol [Carbamazepine] Other (See Comments)    Felt drunk    Prior to Admission medications   Medication Sig Start Date End Date Taking? Authorizing Provider  acetaminophen (TYLENOL) 500 MG tablet Take 1,000 mg by mouth every 6 (six) hours as needed. For pain   Yes Historical Provider, MD  ALPRAZolam (XANAX) 0.25 MG tablet Take 0.25 mg by mouth 2 (two) times daily as needed for anxiety. For anxiety   Yes Historical Provider, MD  amiodarone (PACERONE) 200 MG tablet Take 200 mg by mouth daily.   Yes Historical Provider, MD  atorvastatin (LIPITOR) 80 MG tablet Take 80 mg by mouth every evening.   Yes Historical Provider, MD  calcium carbonate (OS-CAL) 600 MG TABS Take 600 mg by mouth 2 (two) times daily with a meal.   Yes Historical Provider, MD  cilostazol (PLETAL) 100 MG tablet Take 100 mg by mouth 2 (two) times daily.   Yes Historical Provider, MD  diltiazem (CARDIZEM CD) 120 MG 24 hr capsule Take 1 capsule (120 mg total) by mouth daily. 02/04/13 02/04/14 Yes Lennette Bihari, MD  esomeprazole (NEXIUM) 40 MG packet Take 40 mg by mouth daily before  breakfast.    Yes Historical Provider, MD  ezetimibe (ZETIA) 10 MG tablet Take 10 mg by mouth every evening.    Yes Historical Provider, MD  fluticasone (FLONASE) 50 MCG/ACT nasal spray Place 2 sprays into the nose daily. 02/28/13  Yes Historical Provider, MD  furosemide (LASIX) 20 MG tablet Take 20 mg by mouth every other day.   Yes Historical Provider, MD  hydrochlorothiazide (HYDRODIURIL) 25 MG tablet Take 25 mg by mouth daily.   Yes Historical Provider, MD  HYDROcodone-acetaminophen (NORCO/VICODIN) 5-325 MG per tablet Take 1 tablet by mouth every 6 (six) hours as needed. For pain   Yes Historical Provider, MD  isosorbide mononitrate (IMDUR) 30 MG 24 hr tablet Take 30 mg by mouth daily.   Yes Historical Provider, MD  Levocetirizine Dihydrochloride (XYZAL PO) Take 10 mg by mouth every evening.   Yes Historical Provider, MD  metoprolol succinate (TOPROL-XL) 100 MG  24 hr tablet Take 100 mg by mouth daily. Take with or immediately following a meal.   Yes Historical Provider, MD  Omega-3 Fatty Acids (FISH OIL) 1200 MG CAPS Take 1 capsule by mouth 2 (two) times daily.   Yes Historical Provider, MD  potassium chloride SA (K-DUR,KLOR-CON) 20 MEQ tablet Take 20 mEq by mouth daily.   Yes Historical Provider, MD  psyllium (METAMUCIL) 58.6 % powder Take 1 packet by mouth daily. At lunch or supper   Yes Historical Provider, MD  ramipril (ALTACE) 10 MG tablet Take 10 mg by mouth daily.   Yes Historical Provider, MD  Tiotropium Bromide Monohydrate (SPIRIVA HANDIHALER IN) Inhale 1 capsule into the lungs daily as needed.    Yes Historical Provider, MD  warfarin (COUMADIN) 5 MG tablet Take 2.5-5 mg by mouth every evening. Take 5mg  on Wednesday and Friday. Take 2.5mg  the rest of the week.   Yes Historical Provider, MD  Dola Argyle LANCETS 33G MISC  01/12/13   Historical Provider, MD  Lum Babe test strip  01/12/13   Historical Provider, MD     Results for orders placed during the hospital encounter of  05/12/13 (from the past 48 hour(s))  CBC WITH DIFFERENTIAL     Status: None   Collection Time    05/12/13  9:11 AM      Result Value Range   WBC 8.0  4.0 - 10.5 K/uL   RBC 4.52  3.87 - 5.11 MIL/uL   Hemoglobin 13.4  12.0 - 15.0 g/dL   HCT 16.1  09.6 - 04.5 %   MCV 90.0  78.0 - 100.0 fL   MCH 29.6  26.0 - 34.0 pg   MCHC 32.9  30.0 - 36.0 g/dL   RDW 40.9  81.1 - 91.4 %   Platelets 225  150 - 400 K/uL   Neutrophils Relative % 76  43 - 77 %   Neutro Abs 6.1  1.7 - 7.7 K/uL   Lymphocytes Relative 12  12 - 46 %   Lymphs Abs 0.9  0.7 - 4.0 K/uL   Monocytes Relative 12  3 - 12 %   Monocytes Absolute 1.0  0.1 - 1.0 K/uL   Eosinophils Relative 0  0 - 5 %   Eosinophils Absolute 0.0  0.0 - 0.7 K/uL   Basophils Relative 0  0 - 1 %   Basophils Absolute 0.0  0.0 - 0.1 K/uL  BASIC METABOLIC PANEL     Status: Abnormal   Collection Time    05/12/13  9:11 AM      Result Value Range   Sodium 134 (*) 135 - 145 mEq/L   Potassium 3.3 (*) 3.5 - 5.1 mEq/L   Chloride 96  96 - 112 mEq/L   CO2 28  19 - 32 mEq/L   Glucose, Bld 134 (*) 70 - 99 mg/dL   BUN 20  6 - 23 mg/dL   Creatinine, Ser 7.82  0.50 - 1.10 mg/dL   Calcium 8.9  8.4 - 95.6 mg/dL   GFR calc non Af Amer 45 (*) >90 mL/min   GFR calc Af Amer 52 (*) >90 mL/min   Comment: (NOTE)     The eGFR has been calculated using the CKD EPI equation.     This calculation has not been validated in all clinical situations.     eGFR's persistently <90 mL/min signify possible Chronic Kidney     Disease.  TROPONIN I     Status: None  Collection Time    05/12/13  9:11 AM      Result Value Range   Troponin I <0.30  <0.30 ng/mL   Comment:            Due to the release kinetics of cTnI,     a negative result within the first hours     of the onset of symptoms does not rule out     myocardial infarction with certainty.     If myocardial infarction is still suspected,     repeat the test at appropriate intervals.  MAGNESIUM     Status: None    Collection Time    05/12/13  9:11 AM      Result Value Range   Magnesium 1.9  1.5 - 2.5 mg/dL  PROTIME-INR     Status: Abnormal   Collection Time    05/12/13  9:11 AM      Result Value Range   Prothrombin Time 30.5 (*) 11.6 - 15.2 seconds   INR 3.06 (*) 0.00 - 1.49  APTT     Status: Abnormal   Collection Time    05/12/13  9:11 AM      Result Value Range   aPTT 50 (*) 24 - 37 seconds   Comment:            IF BASELINE aPTT IS ELEVATED,     SUGGEST PATIENT RISK ASSESSMENT     BE USED TO DETERMINE APPROPRIATE     ANTICOAGULANT THERAPY.  URINALYSIS, ROUTINE W REFLEX MICROSCOPIC     Status: None   Collection Time    05/12/13  9:37 AM      Result Value Range   Color, Urine YELLOW  YELLOW   APPearance CLEAR  CLEAR   Specific Gravity, Urine 1.011  1.005 - 1.030   pH 6.5  5.0 - 8.0   Glucose, UA NEGATIVE  NEGATIVE mg/dL   Hgb urine dipstick NEGATIVE  NEGATIVE   Bilirubin Urine NEGATIVE  NEGATIVE   Ketones, ur NEGATIVE  NEGATIVE mg/dL   Protein, ur NEGATIVE  NEGATIVE mg/dL   Urobilinogen, UA 0.2  0.0 - 1.0 mg/dL   Nitrite NEGATIVE  NEGATIVE   Leukocytes, UA NEGATIVE  NEGATIVE   Comment: MICROSCOPIC NOT DONE ON URINES WITH NEGATIVE PROTEIN, BLOOD, LEUKOCYTES, NITRITE, OR GLUCOSE <1000 mg/dL.   Dg Chest Port 1 View  05/12/2013   CLINICAL DATA:  Atrial fibrillation. Palpitations.  EXAM: PORTABLE CHEST - 1 VIEW  COMPARISON:  02/24/2012  FINDINGS: Heart size and pulmonary vascularity are normal. There is diffuse accentuation of the interstitial markings, slightly more accentuated on the prior exam. Diaphragm is flattened consistent with emphysema.  IMPRESSION: 1. Slight interstitial accentuation which could represent slight pulmonary edema. 2. Emphysema.   Electronically Signed   By: Geanie Cooley M.D.   On: 05/12/2013 10:18    Review of Systems  Constitutional: Negative for diaphoresis.  Respiratory: Negative for shortness of breath.   Cardiovascular: Negative for chest pain,  palpitations, orthopnea, leg swelling and PND.  Neurological: Negative for dizziness and loss of consciousness.  All other systems reviewed and are negative.    Blood pressure 128/70, pulse 97, temperature 97.2 F (36.2 C), temperature source Oral, resp. rate 22, height 5\' 2"  (1.575 m), weight 150 lb (68.04 kg), SpO2 90.00%. Physical Exam  Constitutional: She is oriented to person, place, and time. She appears well-developed and well-nourished. No distress.  Neck: No JVD present. Carotid bruit is not present.  Cardiovascular: Normal  rate, normal heart sounds and intact distal pulses.  An irregularly irregular rhythm present. Exam reveals no gallop and no friction rub.   No murmur heard. Pulses:      Radial pulses are 2+ on the right side, and 2+ on the left side.       Dorsalis pedis pulses are 2+ on the right side, and 2+ on the left side.  Respiratory: Effort normal and breath sounds normal. No respiratory distress. She has no wheezes. She has no rales.  Musculoskeletal: She exhibits no edema.  Neurological: She is alert and oriented to person, place, and time.  Skin: Skin is warm and dry. She is not diaphoretic.  Psychiatric: She has a normal mood and affect. Her behavior is normal.     Assessment/Plan Principal Problem:   Atrial fibrillation with RVR Active Problems:   HYPERLIPIDEMIA   HYPERTENSION   Long term (current) use of anticoagulants   CAD (coronary artery disease)  Plan: Pt remains in A-fib however HR is better controlled in the 80s-90s. BP is stable. Her INR is therapeutic. Labs show hypokalemia with K+ of 3.3. All other labs are WNL. Supplemental K was given in ER. U/A is negative. No infiltrates on CXR. Pt can be discharged from ER today. We will adjust Amiodarone and will arrange f/u with Dr. Tresa Endo.   Allayne Butcher, PA-C 05/12/2013, 11:00 AM

## 2013-05-12 NOTE — ED Notes (Signed)
MD at bedside. 

## 2013-05-12 NOTE — ED Notes (Signed)
Pt brought back to room via wheelchair with family in tow, pt getting undressed and into a gown at this time

## 2013-05-12 NOTE — Consult Note (Signed)
Pt. Seen and examined. Agree with the NP/PA-C note as written.  Well known patient of Dr. Tresa Endo who I treated last Summer for a-fib with RVR. She has remote atrial flutter and had an ablation in the past. She had done well on amiodarone 400 mg daily and at some point was reduced to 200 mg daily. Over the past few days she has had worsening palpitations and increased heartrate. She was advised by our office to present to the ER for a-fib with RVR. Currently she is asymptomatic. She is in a-fib with CVR. She wants to go home.  Impression: 1.  PAF  Plan: 1.  Increase amiodarone to 200 mg BID. 2.  Continue anticoagulation with follow-ups in our office. 3.  Ok for discharge home today. Follow-up with Dr. Tresa Endo. If she persists in atrial fibrillation, she may need cardioversion at some point.  Chrystie Nose, MD, [facility] Attending Cardiologist [facility]

## 2013-05-12 NOTE — ED Notes (Signed)
Attempted iv x 2

## 2013-05-18 ENCOUNTER — Ambulatory Visit: Payer: Medicare Other | Admitting: Pharmacist Clinician (PhC)/ Clinical Pharmacy Specialist

## 2013-05-20 ENCOUNTER — Ambulatory Visit (INDEPENDENT_AMBULATORY_CARE_PROVIDER_SITE_OTHER): Payer: Medicare Other | Admitting: Cardiovascular Disease

## 2013-05-20 ENCOUNTER — Encounter: Payer: Self-pay | Admitting: Cardiovascular Disease

## 2013-05-20 VITALS — BP 150/78 | HR 65 | Ht 62.0 in | Wt 147.5 lb

## 2013-05-20 DIAGNOSIS — I10 Essential (primary) hypertension: Secondary | ICD-10-CM

## 2013-05-20 DIAGNOSIS — I2510 Atherosclerotic heart disease of native coronary artery without angina pectoris: Secondary | ICD-10-CM

## 2013-05-20 DIAGNOSIS — I4891 Unspecified atrial fibrillation: Secondary | ICD-10-CM

## 2013-05-20 DIAGNOSIS — Z7901 Long term (current) use of anticoagulants: Secondary | ICD-10-CM

## 2013-05-20 MED ORDER — AMIODARONE HCL 200 MG PO TABS: 200.0000 mg | ORAL_TABLET | Freq: Two times a day (BID) | ORAL | Status: DC

## 2013-05-20 NOTE — Patient Instructions (Signed)
Your physician recommends that you schedule a follow-up appointment in: 3 months. No changes were made today.

## 2013-05-24 ENCOUNTER — Encounter: Payer: Self-pay | Admitting: Cardiovascular Disease

## 2013-05-24 NOTE — Progress Notes (Signed)
Patient ID: Cheyenne [name], female   DOB: 1924-12-27, 79 y.o.   MRN: [id number]      HPI: Cheyenne [name], is a 79 y.o. female who presents to the office today for cardiology evaluation and followup of her recent episode of atrial fibrillation with rapid ventricular response at [facility] on 05/12/2013.  [name] has known coronary artery disease. She suffered an inferior wall myocardial infarction in 1997 treated with PTCA of her RCA and ultimately underwent CABG surgery. She has a remote history of atrial flutter and is status post atrial flutter ablation. She also is a history of paroxysmal atrial fibrillation. She was hospitalized with PAF in July 2013. She also has a history of hyperlipidemia, hypertension, as well as GERD  On 01/29/2013 she underwent a 2 year follow-up nuclear study. This continued to show normal perfusion. Post-stress ejection fraction was 73%. There is no evidence for scar or ischemia.  On 05/12/2013, [name] apparently developed another episode of atrial fibrillation with rapid ventricular response leading to her [facility] hospital evaluation. She has been on chronic anticoagulation. She had taken additional doses of Lopressor prior to her evaluation. Her amiodarone dose was adjusted and ultimately she converted to sinus rhythm. Of note, her potassium was 3.3. As her hospitalization, she denies recurrent awareness of palpitations or fast heartbeat. She denies chest pressure. She denies shortness of breath.  Past Medical History  Diagnosis Date  . Atrial fibrillation   . Coronary artery disease   . COPD (chronic obstructive pulmonary disease)   . Hypertension 11/04/2009    renal doppler showed abd aorta normal size  low velocities  w/ moderate amt plaque; rgt & lft kidney  normal size,renal arteries normal patency  . Anxiety   . Pneumonia     5-6 years ago  . Arthritis   . Myocardial infarction     Mild MI 1987,inferior wall  . Paroxysmal atrial fibrillation     last  hospitalized July 2013  . Hyperlipidemia   . PVD (peripheral vascular disease) 09/18/2011    carotid doppler showed mild amount of fibrous plaque both carotid arteries w/o significant flow reduction or stenosis  . PVD (peripheral vascular disease) 09/18/2011    Lower extremity doppler showed ABIof 0.97 on the right,0.83 on the left.,50-60%narrowing rgt common iliac greater than 70% right SFA, right posterior tibial & peroneal arteries occlude  . PVD (peripheral vascular disease) 09/14/2010    ABI's in 2012 were 0.94 and 0.81 but velocities appeared more sigificantly changed  . PVD (peripheral vascular disease) with claudication 09/15/2007    abd aotra moder. amt plaque w/low velocities, left SFA occluded vessel, rgt prox. & mid SFA elvated  velocities ABI rgt O.87,ABI  lft 0.77   . Peripheral angiopathy 03/21/2004    total left SFA w/three vessel runoff though tibial vessels are diffusely disease tx medically,left 70% distal lft external iliac,70% rgt common iliac, 50-60% prox and mid rgt SFA ,    Past Surgical History  Procedure Laterality Date  . Nasal sinus surgery    . Quadruple  bypass    . Cholecystectomy    . Eye surgery      bilateral catracts  . Coronary angioplasty  1987    emergent,60% LAD  . Coronary artery bypass graft  03/23/1998    CABGx4 left internal mammary artery to LAD, SVG to diagonal branch to LAD, first obtuse branch Left Circ,and posterior descending branch of RCA  . Doppler echocardiography  05/29/2012    proximal septal thickening  w/normal systolic and probable grade 2 diastolic dysfunction,mod LA dilatation, mild  annilar calcifcation w/mild to mod MR, mild to mod.TR , mild pulm. HTN w/upper normal pulm. pressures. mild aortic sclerosis w/o stenosis and pulm.valvular regrurgitation  . Doppler echocardiography  04/16/2011    EF =>55%, LV normal,Grade II diastolic dysfunction, aortic valve moderately sclerotic  . Doppler echocardiography  06/16/2009    EF =>  55%,LV normal ,mild to moder. mitral annular calcifcation,no aortic stenosis, aortic valve appears moder.sclerotic  . Cardiac stress test  11/09/2010    EF 75%, small area of reversible lateral ischemia. SDS 2 . Extent 3%  . Cardiac stress test  11/04/2009    EF 75%, LV normal ,   . Cardiac catheterization  03/22/1998    severe four vessel coronary disease normal LV function  . Atrial flutter ablation  11/11/1996    Dr Graciela Husbands did RFablation atrial flutter  and sinus node re-entry tachycardia    Allergies  Allergen Reactions  . Elavil [Amitriptyline] Other (See Comments)    Felt drunk  . Nifedipine Other (See Comments)    Heart races  . Tegretol [Carbamazepine] Other (See Comments)    Felt drunk    Current Outpatient Prescriptions  Medication Sig Dispense Refill  . acetaminophen (TYLENOL) 500 MG tablet Take 1,000 mg by mouth every 6 (six) hours as needed. For pain      . ALPRAZolam (XANAX) 0.25 MG tablet Take 0.25 mg by mouth 2 (two) times daily as needed for anxiety. For anxiety      . amiodarone (PACERONE) 200 MG tablet Take 1 tablet (200 mg total) by mouth 2 (two) times daily.  180 tablet  3  . atorvastatin (LIPITOR) 80 MG tablet Take 80 mg by mouth every evening.      . calcium carbonate (OS-CAL) 600 MG TABS Take 600 mg by mouth 2 (two) times daily with a meal.      . cilostazol (PLETAL) 100 MG tablet Take 100 mg by mouth 2 (two) times daily.      Marland Kitchen diltiazem (CARDIZEM CD) 120 MG 24 hr capsule Take 1 capsule (120 mg total) by mouth daily.  90 capsule  3  . esomeprazole (NEXIUM) 40 MG packet Take 40 mg by mouth daily before breakfast.       . ezetimibe (ZETIA) 10 MG tablet Take 10 mg by mouth every evening.       . fluticasone (FLONASE) 50 MCG/ACT nasal spray Place 2 sprays into the nose daily.      . furosemide (LASIX) 20 MG tablet Take 20 mg by mouth every other day.      . hydrochlorothiazide (HYDRODIURIL) 25 MG tablet Take 25 mg by mouth daily.      Marland Kitchen HYDROcodone-acetaminophen  (NORCO/VICODIN) 5-325 MG per tablet Take 1 tablet by mouth every 6 (six) hours as needed. For pain      . isosorbide mononitrate (IMDUR) 30 MG 24 hr tablet Take 30 mg by mouth daily.      . Levocetirizine Dihydrochloride (XYZAL PO) Take 10 mg by mouth every evening.      . metoprolol succinate (TOPROL-XL) 100 MG 24 hr tablet Take 100 mg by mouth daily. Take with or immediately following a meal.      . Omega-3 Fatty Acids (FISH OIL) 1200 MG CAPS Take 1 capsule by mouth 2 (two) times daily.      Letta Pate DELICA LANCETS 33G MISC       . ONETOUCH VERIO test strip       .  potassium chloride SA (K-DUR,KLOR-CON) 20 MEQ tablet Take 20 mEq by mouth daily.      . psyllium (METAMUCIL) 58.6 % powder Take 1 packet by mouth daily. At lunch or supper      . ramipril (ALTACE) 10 MG tablet Take 10 mg by mouth daily.      . Tiotropium Bromide Monohydrate (SPIRIVA HANDIHALER IN) Inhale 1 capsule into the lungs daily as needed.       . warfarin (COUMADIN) 5 MG tablet Take 2.5-5 mg by mouth every evening. Take 5mg  on Wednesday and Friday. Take 2.5mg  the rest of the week.       No current facility-administered medications for this visit.    Socially she is widowed for 4 years. She has 3 children and one grandchild. She has a remote tobacco history but quit in 1999. There is no alcohol use.  ROS is negative for fevers, chills or night sweats. She denies PND or orthopnea. She denies any further episodes of tachycardia or palpitations since her hospital evaluation. She denies chest pressure. There is no abdominal pain. She denies bleeding; or, she did bump her arm and did develop an small area of ecchymosis on her left arm. There are no GU symptoms. She denies significant edema.   Other comprehensive 12 point system review is negative.  PE BP 150/78  Pulse 65  Ht 5\' 2"  (1.575 m)  Wt 147 lb 8 oz (66.906 kg)  BMI 26.97 kg/m2  General: Alert, oriented, no distress.  Skin: normal turgor, no rashes HEENT:  Normocephalic, atraumatic. Pupils round and reactive; sclera anicteric;no lid lag.  Nose without nasal septal hypertrophy Mouth/Parynx benign; Mallinpatti scale 3 Neck: No JVD, no carotid briuts Lungs: clear to ausculatation and percussion; no wheezing or rales Heart: RRR, s1 s2 normal 1/6 systolic murmur Abdomen: soft, nontender; no hepatosplenomehaly, BS+; abdominal aorta nontender and not dilated by palpation. Pulses 2+ Extremities: no clubbing cyanosis or edema, Homan's sign negative  Neurologic: grossly nonfocal  ECG: Sinus rhythm at 65 beats per minute.  Mild RV conduction  LABS:  BMET    Component Value Date/Time   NA 134* 05/12/2013 0911   K 3.3* 05/12/2013 0911   CL 96 05/12/2013 0911   CO2 28 05/12/2013 0911   GLUCOSE 134* 05/12/2013 0911   BUN 20 05/12/2013 0911   CREATININE 1.09 05/12/2013 0911   CREATININE 1.10 03/05/2013 1236   CALCIUM 8.9 05/12/2013 0911   GFRNONAA 45* 05/12/2013 0911   GFRAA 52* 05/12/2013 0911     Hepatic Function Panel     Component Value Date/Time   PROT 6.8 03/05/2013 1236   ALBUMIN 3.8 03/05/2013 1236   AST 31 03/05/2013 1236   ALT 37* 03/05/2013 1236   ALKPHOS 72 03/05/2013 1236   BILITOT 0.7 03/05/2013 1236   BILIDIR 0.2 02/20/2010 1139     CBC    Component Value Date/Time   WBC 8.0 05/12/2013 0911   RBC 4.52 05/12/2013 0911   HGB 13.4 05/12/2013 0911   HCT 40.7 05/12/2013 0911   PLT 225 05/12/2013 0911   MCV 90.0 05/12/2013 0911   MCH 29.6 05/12/2013 0911   MCHC 32.9 05/12/2013 0911   RDW 13.6 05/12/2013 0911   LYMPHSABS 0.9 05/12/2013 0911   MONOABS 1.0 05/12/2013 0911   EOSABS 0.0 05/12/2013 0911   BASOSABS 0.0 05/12/2013 0911     BNP No results found for this basename: probnp    Lipid Panel  No results found for this basename: chol,  trig,  hdl,  cholhdl,  vldl,  ldlcalc     RADIOLOGY: No results found.    ASSESSMENT AND PLAN: Ms. Collings continues to do well, now 17 years following her inferior wall myocardial infarction treated  with PTCA the right coronary artery and 15 years status post successful CABG revascularization surgery. Her most recent nuclear perfusion study is unchanged from 2 years previously.  She had recently developed an episode of recurrent atrial fibrillation with rapid ventricular response which did respond to increased beta blocker therapy as well as amiodarone dosing. She now is taking amiodarone 400 mg daily and I would continue this presently. Her INR today is therapeutic at 3.2. She now is on metoprolol succinate 100 mg daily as well as low-dose Cardizem CD 120 mg at night. She's on a sedation and also her lipids are aggressively treated with combinations that he a plus high-dose atorvastatin. I will see her in the office in 3 months. If she is maintaining sinus rhythm at that time I will reduce her amiodarone to 300 mg daily.   Lennette Bihari, MD, [facility]  05/24/2013 11:37 AM

## 2013-05-27 ENCOUNTER — Ambulatory Visit (INDEPENDENT_AMBULATORY_CARE_PROVIDER_SITE_OTHER): Payer: Medicare Other | Admitting: Pharmacist Clinician (PhC)/ Clinical Pharmacy Specialist

## 2013-05-27 VITALS — BP 124/80 | HR 72

## 2013-05-27 DIAGNOSIS — I4891 Unspecified atrial fibrillation: Secondary | ICD-10-CM

## 2013-05-27 DIAGNOSIS — Z7901 Long term (current) use of anticoagulants: Secondary | ICD-10-CM

## 2013-05-27 LAB — POCT INR: INR: 2.8

## 2013-06-02 ENCOUNTER — Telehealth: Payer: Self-pay | Admitting: Cardiovascular Disease

## 2013-06-02 NOTE — Telephone Encounter (Signed)
Take extra 1/2 of toprol today the can increase toprol to 125 mg daily from 100 mg

## 2013-06-02 NOTE — Telephone Encounter (Signed)
Needs a nurse to call she is in afib

## 2013-06-02 NOTE — Telephone Encounter (Signed)
Returned call and pt verified x 2.  Pt stated she has been fibrillating since Friday.  Stated HR has been 66-101.  Stated when she was seen in the ER on 10.7.14, they told her it would do more harm than good to do anything if HR <120.  Pt denied symptoms.  Stated it is just wearing on her psychologically.  Pt wants to know if Dr. Tresa Endo wants to make any changes to her medicines.  Stated she did NOT take the extra 1/2 metoprolol b/c her amiodarone was increased at the last visit.  Pt informed Dr. Tresa Endo will be notified for further instructions and RN will call her back.  Pt verbalized understanding and agreed w/ plan.  Message forwarded to Dr. Tresa Endo.  This note placed on cart.

## 2013-06-02 NOTE — Telephone Encounter (Signed)
Returned call and informed pt per instructions by MD/PA.  Pt verbalized understanding and agreed w/ plan.  Pt repeated instructions and agreed to continue to monitor BP.  Pt will call back if no improvement in a week or sooner if needed.  Pt declined offer to update Rx with pharmacy.  She will call back if she starts to run low.

## 2013-06-05 ENCOUNTER — Ambulatory Visit (INDEPENDENT_AMBULATORY_CARE_PROVIDER_SITE_OTHER): Payer: Medicare Other | Admitting: Cardiology

## 2013-06-05 ENCOUNTER — Other Ambulatory Visit: Payer: Self-pay | Admitting: *Deleted

## 2013-06-05 ENCOUNTER — Encounter: Payer: Self-pay | Admitting: Cardiology

## 2013-06-05 VITALS — BP 140/80 | HR 72 | Ht 62.0 in | Wt 147.0 lb

## 2013-06-05 DIAGNOSIS — I480 Paroxysmal atrial fibrillation: Secondary | ICD-10-CM | POA: Insufficient documentation

## 2013-06-05 DIAGNOSIS — R531 Weakness: Secondary | ICD-10-CM

## 2013-06-05 DIAGNOSIS — R5381 Other malaise: Secondary | ICD-10-CM

## 2013-06-05 DIAGNOSIS — E119 Type 2 diabetes mellitus without complications: Secondary | ICD-10-CM

## 2013-06-05 DIAGNOSIS — E876 Hypokalemia: Secondary | ICD-10-CM

## 2013-06-05 DIAGNOSIS — R42 Dizziness and giddiness: Secondary | ICD-10-CM

## 2013-06-05 DIAGNOSIS — I4891 Unspecified atrial fibrillation: Secondary | ICD-10-CM

## 2013-06-05 DIAGNOSIS — I2510 Atherosclerotic heart disease of native coronary artery without angina pectoris: Secondary | ICD-10-CM

## 2013-06-05 DIAGNOSIS — Z7901 Long term (current) use of anticoagulants: Secondary | ICD-10-CM

## 2013-06-05 MED ORDER — DILTIAZEM HCL ER COATED BEADS 180 MG PO CP24: 180.0000 mg | ORAL_CAPSULE | Freq: Every day | ORAL | Status: DC

## 2013-06-05 NOTE — Patient Instructions (Addendum)
We will increase your cardizem to 180 mg daily.   for today take an extra 25 mg of toprol.  Have lab work done today.  Follow up with Nada Boozer, NP-C on a day Dr. Tresa Endo in office in 1 week.   Hold coumadin today only

## 2013-06-05 NOTE — Telephone Encounter (Signed)
Patient is reporting in as instructed last week.  We wanted to know how she is feeling.

## 2013-06-05 NOTE — Assessment & Plan Note (Signed)
No chest pain

## 2013-06-05 NOTE — Assessment & Plan Note (Signed)
K+ 3.3 in Er.

## 2013-06-05 NOTE — Assessment & Plan Note (Signed)
INR was 3.5 will hold coumadin today and resume regular dose tomorrow and follow up with Belenda Cruise next week.

## 2013-06-05 NOTE — Assessment & Plan Note (Addendum)
Went into PAF this AM and became lightheaded.  Rate is controlled.  On anticoagulation.  Second recent episode.  Amiodarone has already been increased, discussed with Dr. Tresa Endo will increase her cardizem to 180 mg dialy. I will have her take an extra 25 mg of toprol today as she has already taken 120 mg cardizem today.  Will recheck labs, CBC, BMP. TSH.

## 2013-06-05 NOTE — Telephone Encounter (Signed)
^^  Of note, this is an open note from 10.6.14 and first two messages not r/t recent call.  See phone note 10.28.14.   Returned call and pt verified x 2.  Pt stated she is still in Afib and wants to know what to do next.  Asked that Dr. Tresa Endo be notified to inform her.  Pt informed if previous Tx didn't work, usually we have her come in for evaluation again and Tx.  Pt agreed w/ plan and appt scheduled for today at 11am w/ Nada Boozer, NP.  Pt will have neighbor drive her as she stated she feels a little weak this morning.

## 2013-06-05 NOTE — Progress Notes (Signed)
06/05/2013   PCP: Dois Davenport., MD   Chief Complaint  Patient presents with  . Atrial Fibrillation    Primary Cardiologist: Dr. Tresa Endo  HPI: Cheyenne [name], is a 79 y.o. female who presents to the office today after calling in with complaints of atrial fib and feeling dizzy.  She had recent episode of atrial fibrillation with rapid ventricular response at [facility] on 05/12/2013.  At that time her Amiodarone was increased to 200 BID, her Toprol has been increased to 125 mg daily.  She is also on cardizem at 120 mg and is anticoagulated.  When she had follow up with Dr. Tresa Endo 05/20/13 she was in SR.  Ms. Brett has known coronary artery disease. She suffered an inferior wall myocardial infarction in 1997 treated with PTCA of her RCA and ultimately underwent CABG surgery. She has a remote history of atrial flutter and is status post atrial flutter ablation. She also is a history of paroxysmal atrial fibrillation. She was hospitalized with PAF in July 2013.  She also has a history of hyperlipidemia, hypertension, as well as GERD On 01/29/2013 she underwent a 2 year follow-up nuclear study. This continued to show normal perfusion. Post-stress ejection fraction was 73%. There is no evidence for scar or ischemia.  Of note, her potassium was 3.3. As her hospitalization, she denies recurrent awareness of palpitations or fast heartbeat.   Today she suddenly felt lightheaded that resolved quickly.  When checking her BP her heart rate was up and down.  She denies chest pressure. She denies shortness of breath.  Here she is in Atrial fib with controlled rate.     Allergies  Allergen Reactions  . Elavil [Amitriptyline] Other (See Comments)    Felt drunk  . Nifedipine Other (See Comments)    Heart races  . Tegretol [Carbamazepine] Other (See Comments)    Felt drunk    Current Outpatient Prescriptions  Medication Sig Dispense Refill  . acetaminophen (TYLENOL) 500 MG tablet  Take 1,000 mg by mouth every 6 (six) hours as needed. For pain      . ALPRAZolam (XANAX) 0.25 MG tablet Take 0.25 mg by mouth 2 (two) times daily as needed for anxiety. For anxiety      . amiodarone (PACERONE) 200 MG tablet Take 1 tablet (200 mg total) by mouth 2 (two) times daily.  180 tablet  3  . atorvastatin (LIPITOR) 80 MG tablet Take 80 mg by mouth every evening.      . calcium carbonate (OS-CAL) 600 MG TABS Take 600 mg by mouth 2 (two) times daily with a meal.      . cilostazol (PLETAL) 100 MG tablet Take 100 mg by mouth 2 (two) times daily.      Marland Kitchen esomeprazole (NEXIUM) 40 MG packet Take 40 mg by mouth daily before breakfast.       . ezetimibe (ZETIA) 10 MG tablet Take 10 mg by mouth every evening.       . fluticasone (FLONASE) 50 MCG/ACT nasal spray Place 2 sprays into the nose daily.      . furosemide (LASIX) 20 MG tablet Take 20 mg by mouth every other day.      . hydrochlorothiazide (HYDRODIURIL) 25 MG tablet Take 25 mg by mouth daily.      Marland Kitchen HYDROcodone-acetaminophen (NORCO/VICODIN) 5-325 MG per tablet Take 1 tablet by mouth every 6 (six) hours as needed. For pain      . isosorbide mononitrate (  IMDUR) 30 MG 24 hr tablet Take 30 mg by mouth daily.      . Levocetirizine Dihydrochloride (XYZAL PO) Take 10 mg by mouth every evening.      . metoprolol succinate (TOPROL-XL) 100 MG 24 hr tablet Take 125 mg by mouth daily. Take with or immediately following a meal.      . Omega-3 Fatty Acids (FISH OIL) 1200 MG CAPS Take 1 capsule by mouth 2 (two) times daily.      Letta Pate DELICA LANCETS 33G MISC       . ONETOUCH VERIO test strip       . potassium chloride SA (K-DUR,KLOR-CON) 20 MEQ tablet Take 20 mEq by mouth daily.      . psyllium (METAMUCIL) 58.6 % powder Take 1 packet by mouth daily. At lunch or supper      . ramipril (ALTACE) 10 MG tablet Take 10 mg by mouth daily.      . Tiotropium Bromide Monohydrate (SPIRIVA HANDIHALER IN) Inhale 1 capsule into the lungs daily as needed.       .  warfarin (COUMADIN) 5 MG tablet Take 2.5-5 mg by mouth every evening. Take 5mg  on Wednesday and Friday. Take 2.5mg  the rest of the week.      . diltiazem (CARDIZEM CD) 180 MG 24 hr capsule Take 1 capsule (180 mg total) by mouth daily.  30 capsule  0   No current facility-administered medications for this visit.    Past Medical History  Diagnosis Date  . Atrial fibrillation   . Coronary artery disease   . COPD (chronic obstructive pulmonary disease)   . Hypertension 11/04/2009    renal doppler showed abd aorta normal size  low velocities  w/ moderate amt plaque; rgt & lft kidney  normal size,renal arteries normal patency  . Anxiety   . Pneumonia     5-6 years ago  . Arthritis   . Myocardial infarction     Mild MI 1987,inferior wall  . Paroxysmal atrial fibrillation     last hospitalized July 2013  . Hyperlipidemia   . PVD (peripheral vascular disease) 09/18/2011    carotid doppler showed mild amount of fibrous plaque both carotid arteries w/o significant flow reduction or stenosis  . PVD (peripheral vascular disease) 09/18/2011    Lower extremity doppler showed ABIof 0.97 on the right,0.83 on the left.,50-60%narrowing rgt common iliac greater than 70% right SFA, right posterior tibial & peroneal arteries occlude  . PVD (peripheral vascular disease) 09/14/2010    ABI's in 2012 were 0.94 and 0.81 but velocities appeared more sigificantly changed  . PVD (peripheral vascular disease) with claudication 09/15/2007    abd aotra moder. amt plaque w/low velocities, left SFA occluded vessel, rgt prox. & mid SFA elvated  velocities ABI rgt O.87,ABI  lft 0.77   . Peripheral angiopathy 03/21/2004    total left SFA w/three vessel runoff though tibial vessels are diffusely disease tx medically,left 70% distal lft external iliac,70% rgt common iliac, 50-60% prox and mid rgt SFA ,    Past Surgical History  Procedure Laterality Date  . Nasal sinus surgery    . Quadruple  bypass    . Cholecystectomy     . Eye surgery      bilateral catracts  . Coronary angioplasty  1987    emergent,60% LAD  . Coronary artery bypass graft  03/23/1998    CABGx4 left internal mammary artery to LAD, SVG to diagonal branch to LAD, first obtuse branch Left Circ,and  posterior descending branch of RCA  . Doppler echocardiography  05/29/2012    proximal septal thickening w/normal systolic and probable grade 2 diastolic dysfunction,mod LA dilatation, mild  annilar calcifcation w/mild to mod MR, mild to mod.TR , mild pulm. HTN w/upper normal pulm. pressures. mild aortic sclerosis w/o stenosis and pulm.valvular regrurgitation  . Doppler echocardiography  04/16/2011    EF =>55%, LV normal,Grade II diastolic dysfunction, aortic valve moderately sclerotic  . Doppler echocardiography  06/16/2009    EF => 55%,LV normal ,mild to moder. mitral annular calcifcation,no aortic stenosis, aortic valve appears moder.sclerotic  . Cardiac stress test  11/09/2010    EF 75%, small area of reversible lateral ischemia. SDS 2 . Extent 3%  . Cardiac stress test  11/04/2009    EF 75%, LV normal ,   . Cardiac catheterization  03/22/1998    severe four vessel coronary disease normal LV function  . Atrial flutter ablation  11/11/1996    Dr Graciela Husbands did RFablation atrial flutter  and sinus node re-entry tachycardia    ZOX:[id number]:UJ colds or fevers, no weight changes Skin:no rashes or ulcers HEENT:no blurred vision, no congestion CV:see HPI PUL:see HPI GI:no diarrhea constipation or melena, no indigestion GU:no hematuria, no dysuria MS:no joint pain, no claudication Neuro:no syncope, + lightheadedness Endo:no diabetes, no thyroid disease  PHYSICAL EXAM BP 140/80  Pulse 72  Ht 5\' 2"  (1.575 m)  Wt 147 lb (66.679 kg)  BMI 26.88 kg/m2 General:Pleasant affect, NAD Skin:Warm and dry, brisk capillary refill HEENT:normocephalic, sclera clear, mucus membranes moist Neck:supple, no JVD, no bruits  Heart:S1S2 RRR with soft 1/6 systolic  murmur, no gallup, rub or click Lungs:clear without rales, rhonchi, or wheezes WJX:BJYN, non tender, + BS, do not palpate liver spleen or masses Ext:no lower ext edema, 2+ pedal pulses, 2+ radial pulses Neuro:alert and oriented, MAE, follows commands, + facial symmetry  WGN:FAOZHY fib wit HR 72 no acute changes otherwise.  ASSESSMENT AND PLAN PAF (paroxysmal atrial fibrillation) Went into PAF this AM and became lightheaded.  Rate is controlled.  On anticoagulation.  Second recent episode.  Amiodarone has already been increased, discussed with Dr. Tresa Endo will increase her cardizem to 180 mg dialy. I will have her take an extra 25 mg of toprol today as she has already taken 120 mg cardizem today.  Will recheck labs, CBC, BMP. TSH.  Long term (current) use of anticoagulants INR was 3.5 will hold coumadin today and resume regular dose tomorrow and follow up with Belenda Cruise next week.  DIABETES MELLITUS stable  CAD (coronary artery disease) No chest pain  Hypokalemia K+ 3.3 in Er.    Dr. Tresa Endo or I will see her back in 1 week.

## 2013-06-05 NOTE — Assessment & Plan Note (Signed)
stable °

## 2013-06-08 ENCOUNTER — Encounter: Payer: Self-pay | Admitting: Cardiology

## 2013-06-10 ENCOUNTER — Ambulatory Visit: Payer: Medicare Other | Admitting: Pharmacist Clinician (PhC)/ Clinical Pharmacy Specialist

## 2013-06-11 ENCOUNTER — Encounter: Payer: Self-pay | Admitting: Cardiology

## 2013-06-11 ENCOUNTER — Ambulatory Visit (INDEPENDENT_AMBULATORY_CARE_PROVIDER_SITE_OTHER): Payer: Medicare Other | Admitting: Cardiology

## 2013-06-11 ENCOUNTER — Ambulatory Visit (INDEPENDENT_AMBULATORY_CARE_PROVIDER_SITE_OTHER): Payer: Medicare Other | Admitting: Pharmacist Clinician (PhC)/ Clinical Pharmacy Specialist

## 2013-06-11 ENCOUNTER — Encounter: Payer: Self-pay | Admitting: Cardiovascular Disease

## 2013-06-11 VITALS — BP 140/86 | HR 71 | Ht 62.0 in | Wt 149.0 lb

## 2013-06-11 DIAGNOSIS — I480 Paroxysmal atrial fibrillation: Secondary | ICD-10-CM

## 2013-06-11 DIAGNOSIS — Z7901 Long term (current) use of anticoagulants: Secondary | ICD-10-CM

## 2013-06-11 DIAGNOSIS — I4891 Unspecified atrial fibrillation: Secondary | ICD-10-CM

## 2013-06-11 DIAGNOSIS — I10 Essential (primary) hypertension: Secondary | ICD-10-CM

## 2013-06-11 MED ORDER — DILTIAZEM HCL ER COATED BEADS 120 MG PO CP24: 240.0000 mg | ORAL_CAPSULE | Freq: Every day | ORAL | Status: DC

## 2013-06-11 NOTE — Assessment & Plan Note (Signed)
Controlled.  

## 2013-06-11 NOTE — Assessment & Plan Note (Signed)
INR therapeutic.  06/11/13  INR 2.6;   06/05/2013 INR 3.8;  05/27/2013   INR 2.8;  05/11/2013   INR 3.2;  04/27/2013   INR 3.6;   03/26/13 INR 2.5

## 2013-06-11 NOTE — Progress Notes (Signed)
06/11/2013   PCP: Dois Davenport., MD   Chief Complaint  Patient presents with  . Follow-up    one week visit    Primary Cardiologist: Dr. Tresa Endo  HPI: Cheyenne [name], is a 79 y.o. female who presents to the office today for follow up from last week -with complaints of atrial fib and feeling dizzy. She had recent episode of atrial fibrillation with rapid ventricular response at [facility] on 05/12/2013. At that time her Amiodarone was increased to 200 BID, her Toprol has been increased to 125 mg daily. She is also on cardizem at 120 mg and is anticoagulated. When she had follow up with Dr. Tresa Endo 05/20/13 she was in SR.   [name] has known coronary artery disease. She suffered an inferior wall myocardial infarction in 1997 treated with PTCA of her RCA and ultimately underwent CABG surgery. She has a remote history of atrial flutter and is status post atrial flutter ablation. She also is a history of paroxysmal atrial fibrillation. She was hospitalized with PAF in July 2013. She also has a history of hyperlipidemia, hypertension, as well as GERD On 01/29/2013 she underwent a 2 year follow-up nuclear study. This continued to show normal perfusion. Post-stress ejection fraction was 73%. There is no evidence for scar or ischemia. Of note, her potassium was 3.3. At her hospitalization.    Today she remains in atrial fib,  she denies recurrent awareness of palpitations or fast heartbeat.  She is very weak yesterday buying groceries she had extremely fatigued and did not think she could make it home. She has mild shortness of breath and some lower extremity edema as well the minimal. No chest pain.     Allergies  Allergen Reactions  . Elavil [Amitriptyline] Other (See Comments)    Felt drunk  . Nifedipine Other (See Comments)    Heart races  . Tegretol [Carbamazepine] Other (See Comments)    Felt drunk    Current Outpatient Prescriptions  Medication Sig Dispense Refill    . acetaminophen (TYLENOL) 500 MG tablet Take 1,000 mg by mouth every 6 (six) hours as needed. For pain      . ALPRAZolam (XANAX) 0.25 MG tablet Take 0.25 mg by mouth 2 (two) times daily as needed for anxiety. For anxiety      . amiodarone (PACERONE) 200 MG tablet Take 1 tablet (200 mg total) by mouth 2 (two) times daily.  180 tablet  3  . atorvastatin (LIPITOR) 80 MG tablet Take 80 mg by mouth every evening.      . calcium carbonate (OS-CAL) 600 MG TABS Take 600 mg by mouth 2 (two) times daily with a meal.      . cilostazol (PLETAL) 100 MG tablet Take 100 mg by mouth 2 (two) times daily.      Marland Kitchen esomeprazole (NEXIUM) 40 MG packet Take 40 mg by mouth daily before breakfast.       . ezetimibe (ZETIA) 10 MG tablet Take 10 mg by mouth every evening.       . fluticasone (FLONASE) 50 MCG/ACT nasal spray Place 2 sprays into the nose daily.      . furosemide (LASIX) 20 MG tablet Take 20 mg by mouth every other day.      . hydrochlorothiazide (HYDRODIURIL) 25 MG tablet Take 25 mg by mouth daily.      Marland Kitchen HYDROcodone-acetaminophen (NORCO/VICODIN) 5-325 MG per tablet Take 1 tablet by mouth every 6 (six) hours as  needed. For pain      . isosorbide mononitrate (IMDUR) 30 MG 24 hr tablet Take 30 mg by mouth daily.      . Levocetirizine Dihydrochloride (XYZAL PO) Take 10 mg by mouth every evening.      . metoprolol succinate (TOPROL-XL) 100 MG 24 hr tablet Take 125 mg by mouth daily. Take with or immediately following a meal.      . Omega-3 Fatty Acids (FISH OIL) 1200 MG CAPS Take 1 capsule by mouth 2 (two) times daily.      Letta Pate DELICA LANCETS 33G MISC       . ONETOUCH VERIO test strip       . potassium chloride SA (K-DUR,KLOR-CON) 20 MEQ tablet Take 30 mEq by mouth daily.       . psyllium (METAMUCIL) 58.6 % powder Take 1 packet by mouth daily. At lunch or supper      . ramipril (ALTACE) 10 MG tablet Take 10 mg by mouth daily.      . Tiotropium Bromide Monohydrate (SPIRIVA HANDIHALER IN) Inhale 1 capsule  into the lungs daily as needed.       . warfarin (COUMADIN) 5 MG tablet Take 2.5-5 mg by mouth every evening. Take 5mg  on Wednesday and Friday. Take 2.5mg  the rest of the week.      . diltiazem (CARDIZEM CD) 120 MG 24 hr capsule Take 2 capsules (240 mg total) by mouth daily.  90 capsule  3   No current facility-administered medications for this visit.    Past Medical History  Diagnosis Date  . Atrial fibrillation   . Coronary artery disease   . COPD (chronic obstructive pulmonary disease)   . Hypertension 11/04/2009    renal doppler showed abd aorta normal size  low velocities  w/ moderate amt plaque; rgt & lft kidney  normal size,renal arteries normal patency  . Anxiety   . Pneumonia     5-6 years ago  . Arthritis   . Myocardial infarction     Mild MI 1987,inferior wall  . Paroxysmal atrial fibrillation     last hospitalized July 2013  . Hyperlipidemia   . PVD (peripheral vascular disease) 09/18/2011    carotid doppler showed mild amount of fibrous plaque both carotid arteries w/o significant flow reduction or stenosis  . PVD (peripheral vascular disease) 09/18/2011    Lower extremity doppler showed ABIof 0.97 on the right,0.83 on the left.,50-60%narrowing rgt common iliac greater than 70% right SFA, right posterior tibial & peroneal arteries occlude  . PVD (peripheral vascular disease) 09/14/2010    ABI's in 2012 were 0.94 and 0.81 but velocities appeared more sigificantly changed  . PVD (peripheral vascular disease) with claudication 09/15/2007    abd aotra moder. amt plaque w/low velocities, left SFA occluded vessel, rgt prox. & mid SFA elvated  velocities ABI rgt O.87,ABI  lft 0.77   . Peripheral angiopathy 03/21/2004    total left SFA w/three vessel runoff though tibial vessels are diffusely disease tx medically,left 70% distal lft external iliac,70% rgt common iliac, 50-60% prox and mid rgt SFA ,    Past Surgical History  Procedure Laterality Date  . Nasal sinus surgery      . Quadruple  bypass    . Cholecystectomy    . Eye surgery      bilateral catracts  . Coronary angioplasty  1987    emergent,60% LAD  . Coronary artery bypass graft  03/23/1998    CABGx4 left internal mammary artery  to LAD, SVG to diagonal branch to LAD, first obtuse branch Left Circ,and posterior descending branch of RCA  . Doppler echocardiography  05/29/2012    proximal septal thickening w/normal systolic and probable grade 2 diastolic dysfunction,mod LA dilatation, mild  annilar calcifcation w/mild to mod MR, mild to mod.TR , mild pulm. HTN w/upper normal pulm. pressures. mild aortic sclerosis w/o stenosis and pulm.valvular regrurgitation  . Doppler echocardiography  04/16/2011    EF =>55%, LV normal,Grade II diastolic dysfunction, aortic valve moderately sclerotic  . Doppler echocardiography  06/16/2009    EF => 55%,LV normal ,mild to moder. mitral annular calcifcation,no aortic stenosis, aortic valve appears moder.sclerotic  . Cardiac stress test  11/09/2010    EF 75%, small area of reversible lateral ischemia. SDS 2 . Extent 3%  . Cardiac stress test  11/04/2009    EF 75%, LV normal ,   . Cardiac catheterization  03/22/1998    severe four vessel coronary disease normal LV function  . Atrial flutter ablation  11/11/1996    Dr Graciela Husbands did RFablation atrial flutter  and sinus node re-entry tachycardia    ZOX:WRUEAVW:UJ colds or fevers, no weight changes, + weakness Skin:no rashes or ulcers HEENT:no blurred vision, no congestion CV:see HPI PUL:see HPI GI:no diarrhea constipation or melena, no indigestion GU:no hematuria, no dysuria MS:no joint pain, no claudication Neuro:no syncope, no lightheadedness Endo:no diabetes, no thyroid disease  PHYSICAL EXAM BP 140/86  Pulse 71  Ht 5\' 2"  (1.575 m)  Wt 149 lb (67.586 kg)  BMI 27.25 kg/m2 General:Pleasant affect, NAD Skin:Warm and dry, brisk capillary refill HEENT:normocephalic, sclera clear, mucus membranes moist Neck:supple, no  JVD, no bruits  Heart:irreg irreg with soft systolic murmur, no gallup, rub or click Lungs:clear without rales, rhonchi, or wheezes WJX:BJYN, non tender, + BS, do not palpate liver spleen or masses Ext:tr lower ext edema at ankles, 2+ pedal pulses, 2+ radial pulses Neuro:alert and oriented, MAE, follows commands, + facial symmetry  WGN:FAOZHY fib rate controlled and no other acute changes  ASSESSMENT AND PLAN PAF (paroxysmal atrial fibrillation) Continues in atrial fibrillation rate controlled though she at this point is complaining of symptoms. She is extremely weak at times and is slowly increasing volume with lower extremity edema and some mild shortness of breath. I discussed with Dr. Tresa Endo her INR is therapeutic today and it has been for the last 3 months. Our plan will to bring her in for outpatient cardioversion.  In the meantime we'll increase the Cardizem to 240 mg daily and have asked her to take her Lasix every day instead of every other day.  I have not received her lab work from her primary care there the patient tells me her potassium had been low and Dr. Hal Hope replaced.  She'll have repeat labs on Monday in our office so that we can have results for the cardioversion.  The patient did ask about ablation I asked her did discuss with Dr. Tresa Endo her sister has had an ablation with Dr. [name] or has at least discussed it with Dr. our attention was interested she has had an ablation in the past with Dr. Graciela Husbands in the 90s.  HYPERTENSION Controlled  Long term (current) use of anticoagulants INR therapeutic.  06/11/13  INR 2.6;   06/05/2013 INR 3.8;  05/27/2013   INR 2.8;  05/11/2013   INR 3.2;  04/27/2013   INR 3.6;   03/26/13 INR 2.5    I increased her potassium as well to 40 meg daily. If  she continues with episodes of weakness concern would be for bradycardia and an 79 year old lady with Toprol, Cardizem and amiodarone.

## 2013-06-11 NOTE — Patient Instructions (Addendum)
Stop 180 mg of cardizem and instead take 2 of the 120 mg cardizem daily to equal 240 mg  Change your lasix to 20 mg every day.  Increase your potassium to 2 of 20 meq tabs daily since we are increasing your Lasix.  If you become more short of breath or more weakness call us.  We will schedule a cardioversion for Friday or next wed. With Dr. Tresa Endo.

## 2013-06-11 NOTE — Assessment & Plan Note (Addendum)
Continues in atrial fibrillation rate controlled though she at this point is complaining of symptoms. She is extremely weak at times and is slowly increasing volume with lower extremity edema and some mild shortness of breath. I discussed with Dr. Tresa Endo her INR is therapeutic today and it has been for the last 3 months. Our plan will to bring her in for outpatient cardioversion.  In the meantime we'll increase the Cardizem to 240 mg daily and have asked her to take her Lasix every day instead of every other day.  I have not received her lab work from her primary care there the patient tells me her potassium had been low and Dr. Hal Hope replaced.  She'll have repeat labs on Monday in our office so that we can have results for the cardioversion.  The patient did ask about ablation I asked her did discuss with Dr. Tresa Endo her sister has had an ablation with Dr. [name] or has at least discussed it with Dr. our attention was interested she has had an ablation in the past with Dr. Graciela Husbands in the 90s.

## 2013-06-12 ENCOUNTER — Other Ambulatory Visit: Payer: Self-pay | Admitting: *Deleted

## 2013-06-15 ENCOUNTER — Other Ambulatory Visit (INDEPENDENT_AMBULATORY_CARE_PROVIDER_SITE_OTHER): Payer: Medicare Other | Admitting: *Deleted

## 2013-06-15 DIAGNOSIS — D689 Coagulation defect, unspecified: Secondary | ICD-10-CM

## 2013-06-15 DIAGNOSIS — Z7901 Long term (current) use of anticoagulants: Secondary | ICD-10-CM

## 2013-06-15 DIAGNOSIS — I4891 Unspecified atrial fibrillation: Secondary | ICD-10-CM

## 2013-06-15 DIAGNOSIS — Z01818 Encounter for other preprocedural examination: Secondary | ICD-10-CM

## 2013-06-15 LAB — CBC WITH DIFFERENTIAL/PLATELET
Basophils Absolute: 0 10*3/uL (ref 0.0–0.1)
Basophils Relative: 0 % (ref 0–1)
Eosinophils Absolute: 0 10*3/uL (ref 0.0–0.7)
Eosinophils Relative: 1 % (ref 0–5)
HCT: 42.2 % (ref 36.0–46.0)
Hemoglobin: 14.3 g/dL (ref 12.0–15.0)
Lymphocytes Relative: 13 % (ref 12–46)
Lymphs Abs: 1.1 10*3/uL (ref 0.7–4.0)
MCH: 31.3 pg (ref 26.0–34.0)
MCHC: 33.9 g/dL (ref 30.0–36.0)
MCV: 92.3 fL (ref 78.0–100.0)
Monocytes Absolute: 0.8 10*3/uL (ref 0.1–1.0)
Monocytes Relative: 9 % (ref 3–12)
Neutro Abs: 6.5 10*3/uL (ref 1.7–7.7)
Neutrophils Relative %: 77 % (ref 43–77)
Platelets: 259 10*3/uL (ref 150–400)
RBC: 4.57 MIL/uL (ref 3.87–5.11)
RDW: 13.8 % (ref 11.5–15.5)
WBC: 8.4 10*3/uL (ref 4.0–10.5)

## 2013-06-15 LAB — BASIC METABOLIC PANEL WITH GFR
BUN: 16 mg/dL (ref 6–23)
CO2: 29 mEq/L (ref 19–32)
Calcium: 9.1 mg/dL (ref 8.4–10.5)
Chloride: 97 mEq/L (ref 96–112)
Creat: 1.27 mg/dL — ABNORMAL HIGH (ref 0.50–1.10)
GFR, Est African American: 44 mL/min — ABNORMAL LOW
GFR, Est Non African American: 38 mL/min — ABNORMAL LOW
Glucose, Bld: 132 mg/dL — ABNORMAL HIGH (ref 70–99)
Potassium: 3.7 mEq/L (ref 3.5–5.3)
Sodium: 135 mEq/L (ref 135–145)

## 2013-06-15 LAB — MAGNESIUM: Magnesium: 1.8 mg/dL (ref 1.5–2.5)

## 2013-06-15 LAB — APTT: aPTT: 40 seconds — ABNORMAL HIGH (ref 24–37)

## 2013-06-19 ENCOUNTER — Ambulatory Visit (HOSPITAL_COMMUNITY)
Admission: RE | Admit: 2013-06-19 | Discharge: 2013-06-19 | Disposition: A | Discharge status: Home / Self Care | Payer: Medicare Other | Source: Ambulatory Visit | Attending: Cardiovascular Disease | Admitting: Cardiovascular Disease

## 2013-06-19 ENCOUNTER — Encounter (HOSPITAL_COMMUNITY)
Admission: RE | Disposition: A | Discharge status: Home / Self Care | Payer: Self-pay | Source: Ambulatory Visit | Attending: Cardiovascular Disease

## 2013-06-19 ENCOUNTER — Ambulatory Visit (HOSPITAL_COMMUNITY): Payer: Medicare Other | Admitting: Anesthesiology

## 2013-06-19 ENCOUNTER — Encounter (HOSPITAL_COMMUNITY): Payer: Medicare Other | Admitting: Anesthesiology

## 2013-06-19 ENCOUNTER — Encounter (HOSPITAL_COMMUNITY): Payer: Self-pay

## 2013-06-19 DIAGNOSIS — Z01818 Encounter for other preprocedural examination: Secondary | ICD-10-CM

## 2013-06-19 DIAGNOSIS — I2510 Atherosclerotic heart disease of native coronary artery without angina pectoris: Secondary | ICD-10-CM | POA: Insufficient documentation

## 2013-06-19 DIAGNOSIS — I739 Peripheral vascular disease, unspecified: Secondary | ICD-10-CM | POA: Insufficient documentation

## 2013-06-19 DIAGNOSIS — Z7901 Long term (current) use of anticoagulants: Secondary | ICD-10-CM | POA: Insufficient documentation

## 2013-06-19 DIAGNOSIS — I10 Essential (primary) hypertension: Secondary | ICD-10-CM | POA: Insufficient documentation

## 2013-06-19 DIAGNOSIS — Z8701 Personal history of pneumonia (recurrent): Secondary | ICD-10-CM | POA: Insufficient documentation

## 2013-06-19 DIAGNOSIS — F411 Generalized anxiety disorder: Secondary | ICD-10-CM | POA: Insufficient documentation

## 2013-06-19 DIAGNOSIS — E785 Hyperlipidemia, unspecified: Secondary | ICD-10-CM | POA: Insufficient documentation

## 2013-06-19 DIAGNOSIS — J4489 Other specified chronic obstructive pulmonary disease: Secondary | ICD-10-CM | POA: Insufficient documentation

## 2013-06-19 DIAGNOSIS — J449 Chronic obstructive pulmonary disease, unspecified: Secondary | ICD-10-CM | POA: Insufficient documentation

## 2013-06-19 DIAGNOSIS — Z79899 Other long term (current) drug therapy: Secondary | ICD-10-CM | POA: Insufficient documentation

## 2013-06-19 DIAGNOSIS — I4891 Unspecified atrial fibrillation: Secondary | ICD-10-CM | POA: Insufficient documentation

## 2013-06-19 DIAGNOSIS — Z951 Presence of aortocoronary bypass graft: Secondary | ICD-10-CM | POA: Insufficient documentation

## 2013-06-19 DIAGNOSIS — I252 Old myocardial infarction: Secondary | ICD-10-CM | POA: Insufficient documentation

## 2013-06-19 DIAGNOSIS — I440 Atrioventricular block, first degree: Secondary | ICD-10-CM | POA: Insufficient documentation

## 2013-06-19 HISTORY — PX: CARDIOVERSION: SHX1299

## 2013-06-19 HISTORY — DX: Type 2 diabetes mellitus without complications: E11.9

## 2013-06-19 SURGERY — CARDIOVERSION
Anesthesia: General | Wound class: Clean

## 2013-06-19 MED ORDER — SODIUM CHLORIDE 0.9 % IV SOLN
INTRAVENOUS | Status: DC
  Administered 2013-06-19: 14:00:00 via INTRAVENOUS

## 2013-06-19 MED ORDER — PROPOFOL 10 MG/ML IV BOLUS
INTRAVENOUS | Status: DC | PRN
  Administered 2013-06-19: 50 mg via INTRAVENOUS

## 2013-06-19 NOTE — Interval H&P Note (Signed)
History and Physical Interval Note:  06/19/2013 1:58 PM  Cheyenne [name]  has presented today for DC cardioversion, with the diagnosis of AFIB  The various methods of treatment have been discussed with the patient and family. After consideration of risks, benefits and other options for treatment, the patient has consented to  Procedure(s): CARDIOVERSION (N/A) as a surgical intervention .  The patient's history has been reviewed, patient examined, no change in status, stable for surgery.  I have reviewed the patient's chart and labs.  Questions were answered to the patient's satisfaction.     [name]

## 2013-06-19 NOTE — Preoperative (Signed)
Beta Blockers   Reason not to administer Beta Blockers:took toprol today 

## 2013-06-19 NOTE — Anesthesia Postprocedure Evaluation (Signed)
  Anesthesia Post-op Note  Patient: Cheyenne [name]  Procedure(s) Performed: Procedure(s): CARDIOVERSION (N/A)  Patient Location: Endoscopy Unit  Anesthesia Type:General  Level of Consciousness: awake, alert  and oriented  Airway and Oxygen Therapy: Patient Spontanous Breathing and Patient connected to nasal cannula oxygen  Post-op Pain: none  Post-op Assessment: Post-op Vital signs reviewed, Patient's Cardiovascular Status Stable, Respiratory Function Stable and Patent Airway  Post-op Vital Signs: Reviewed and stable  Complications: No apparent anesthesia complications

## 2013-06-19 NOTE — Transfer of Care (Signed)
Immediate Anesthesia Transfer of Care Note  Patient: Cheyenne [name]  Procedure(s) Performed: Procedure(s): CARDIOVERSION (N/A)  Patient Location: Endoscopy Unit  Anesthesia Type:General  Level of Consciousness: awake, alert  and oriented  Airway & Oxygen Therapy: Patient Spontanous Breathing and Patient connected to nasal cannula oxygen  Post-op Assessment: Report given to PACU RN and Post -op Vital signs reviewed and stable  Post vital signs: Reviewed and stable  Complications: No apparent anesthesia complications

## 2013-06-19 NOTE — H&P (View-Only) (Signed)
        06/11/2013   PCP: RICHTER,KAREN L., MD   Chief Complaint  Patient presents with  . Follow-up    one week visit    Primary Cardiologist: Dr. Kelly  HPI: Cheyenne [name], is a 79 y.o. female who presents to the office today for follow up from last week -with complaints of atrial fib and feeling dizzy. She had recent episode of atrial fibrillation with rapid ventricular response at [place] on 05/12/2013. At that time her Amiodarone was increased to 200 BID, her Toprol has been increased to 125 mg daily. She is also on cardizem at 120 mg and is anticoagulated. When she had follow up with Dr. Kelly 05/20/13 she was in SR.   [name] has known coronary artery disease. She suffered an inferior wall myocardial infarction in 1997 treated with PTCA of her RCA and ultimately underwent CABG surgery. She has a remote history of atrial flutter and is status post atrial flutter ablation. She also is a history of paroxysmal atrial fibrillation. She was hospitalized with PAF in July 2013. She also has a history of hyperlipidemia, hypertension, as well as GERD On 01/29/2013 she underwent a 2 year follow-up nuclear study. This continued to show normal perfusion. Post-stress ejection fraction was 73%. There is no evidence for scar or ischemia. Of note, her potassium was 3.3. At her hospitalization.    Today she remains in atrial fib,  she denies recurrent awareness of palpitations or fast heartbeat.  She is very weak yesterday buying groceries she had extremely fatigued and did not think she could make it home. She has mild shortness of breath and some lower extremity edema as well the minimal. No chest pain.     Allergies  Allergen Reactions  . Elavil [Amitriptyline] Other (See Comments)    Felt drunk  . Nifedipine Other (See Comments)    Heart races  . Tegretol [Carbamazepine] Other (See Comments)    Felt drunk    Current Outpatient Prescriptions  Medication Sig Dispense Refill    . acetaminophen (TYLENOL) 500 MG tablet Take 1,000 mg by mouth every 6 (six) hours as needed. For pain      . ALPRAZolam (XANAX) 0.25 MG tablet Take 0.25 mg by mouth 2 (two) times daily as needed for anxiety. For anxiety      . amiodarone (PACERONE) 200 MG tablet Take 1 tablet (200 mg total) by mouth 2 (two) times daily.  180 tablet  3  . atorvastatin (LIPITOR) 80 MG tablet Take 80 mg by mouth every evening.      . calcium carbonate (OS-CAL) 600 MG TABS Take 600 mg by mouth 2 (two) times daily with a meal.      . cilostazol (PLETAL) 100 MG tablet Take 100 mg by mouth 2 (two) times daily.      . esomeprazole (NEXIUM) 40 MG packet Take 40 mg by mouth daily before breakfast.       . ezetimibe (ZETIA) 10 MG tablet Take 10 mg by mouth every evening.       . fluticasone (FLONASE) 50 MCG/ACT nasal spray Place 2 sprays into the nose daily.      . furosemide (LASIX) 20 MG tablet Take 20 mg by mouth every other day.      . hydrochlorothiazide (HYDRODIURIL) 25 MG tablet Take 25 mg by mouth daily.      . HYDROcodone-acetaminophen (NORCO/VICODIN) 5-325 MG per tablet Take 1 tablet by mouth every 6 (six) hours as   needed. For pain      . isosorbide mononitrate (IMDUR) 30 MG 24 hr tablet Take 30 mg by mouth daily.      . Levocetirizine Dihydrochloride (XYZAL PO) Take 10 mg by mouth every evening.      . metoprolol succinate (TOPROL-XL) 100 MG 24 hr tablet Take 125 mg by mouth daily. Take with or immediately following a meal.      . Omega-3 Fatty Acids (FISH OIL) 1200 MG CAPS Take 1 capsule by mouth 2 (two) times daily.      . ONETOUCH DELICA LANCETS 33G MISC       . ONETOUCH VERIO test strip       . potassium chloride SA (K-DUR,KLOR-CON) 20 MEQ tablet Take 30 mEq by mouth daily.       . psyllium (METAMUCIL) 58.6 % powder Take 1 packet by mouth daily. At lunch or supper      . ramipril (ALTACE) 10 MG tablet Take 10 mg by mouth daily.      . Tiotropium Bromide Monohydrate (SPIRIVA HANDIHALER IN) Inhale 1 capsule  into the lungs daily as needed.       . warfarin (COUMADIN) 5 MG tablet Take 2.5-5 mg by mouth every evening. Take 5mg on Wednesday and Friday. Take 2.5mg the rest of the week.      . diltiazem (CARDIZEM CD) 120 MG 24 hr capsule Take 2 capsules (240 mg total) by mouth daily.  90 capsule  3   No current facility-administered medications for this visit.    Past Medical History  Diagnosis Date  . Atrial fibrillation   . Coronary artery disease   . COPD (chronic obstructive pulmonary disease)   . Hypertension 11/04/2009    renal doppler showed abd aorta normal size  low velocities  w/ moderate amt plaque; rgt & lft kidney  normal size,renal arteries normal patency  . Anxiety   . Pneumonia     5-6 years ago  . Arthritis   . Myocardial infarction     Mild MI 1987,inferior wall  . Paroxysmal atrial fibrillation     last hospitalized July 2013  . Hyperlipidemia   . PVD (peripheral vascular disease) 09/18/2011    carotid doppler showed mild amount of fibrous plaque both carotid arteries w/o significant flow reduction or stenosis  . PVD (peripheral vascular disease) 09/18/2011    Lower extremity doppler showed ABIof 0.97 on the right,0.83 on the left.,50-60%narrowing rgt common iliac greater than 70% right SFA, right posterior tibial & peroneal arteries occlude  . PVD (peripheral vascular disease) 09/14/2010    ABI's in 2012 were 0.94 and 0.81 but velocities appeared more sigificantly changed  . PVD (peripheral vascular disease) with claudication 09/15/2007    abd aotra moder. amt plaque w/low velocities, left SFA occluded vessel, rgt prox. & mid SFA elvated  velocities ABI rgt O.87,ABI  lft 0.77   . Peripheral angiopathy 03/21/2004    total left SFA w/three vessel runoff though tibial vessels are diffusely disease tx medically,left 70% distal lft external iliac,70% rgt common iliac, 50-60% prox and mid rgt SFA ,    Past Surgical History  Procedure Laterality Date  . Nasal sinus surgery      . Quadruple  bypass    . Cholecystectomy    . Eye surgery      bilateral catracts  . Coronary angioplasty  1987    emergent,60% LAD  . Coronary artery bypass graft  03/23/1998    CABGx4 left internal mammary artery   to LAD, SVG to diagonal branch to LAD, first obtuse branch Left Circ,and posterior descending branch of RCA  . Doppler echocardiography  05/29/2012    proximal septal thickening w/normal systolic and probable grade 2 diastolic dysfunction,mod LA dilatation, mild  annilar calcifcation w/mild to mod MR, mild to mod.TR , mild pulm. HTN w/upper normal pulm. pressures. mild aortic sclerosis w/o stenosis and pulm.valvular regrurgitation  . Doppler echocardiography  04/16/2011    EF =>55%, LV normal,Grade II diastolic dysfunction, aortic valve moderately sclerotic  . Doppler echocardiography  06/16/2009    EF => 55%,LV normal ,mild to moder. mitral annular calcifcation,no aortic stenosis, aortic valve appears moder.sclerotic  . Cardiac stress test  11/09/2010    EF 75%, small area of reversible lateral ischemia. SDS 2 . Extent 3%  . Cardiac stress test  11/04/2009    EF 75%, LV normal ,   . Cardiac catheterization  03/22/1998    severe four vessel coronary disease normal LV function  . Atrial flutter ablation  11/11/1996    Dr klein did RFablation atrial flutter  and sinus node re-entry tachycardia    ROS:General:no colds or fevers, no weight changes, + weakness Skin:no rashes or ulcers HEENT:no blurred vision, no congestion CV:see HPI PUL:see HPI GI:no diarrhea constipation or melena, no indigestion GU:no hematuria, no dysuria MS:no joint pain, no claudication Neuro:no syncope, no lightheadedness Endo:no diabetes, no thyroid disease  PHYSICAL EXAM BP 140/86  Pulse 71  Ht 5' 2" (1.575 m)  Wt 149 lb (67.586 kg)  BMI 27.25 kg/m2 General:Pleasant affect, NAD Skin:Warm and dry, brisk capillary refill HEENT:normocephalic, sclera clear, mucus membranes moist Neck:supple, no  JVD, no bruits  Heart:irreg irreg with soft systolic murmur, no gallup, rub or click Lungs:clear without rales, rhonchi, or wheezes Abd:soft, non tender, + BS, do not palpate liver spleen or masses Ext:tr lower ext edema at ankles, 2+ pedal pulses, 2+ radial pulses Neuro:alert and oriented, MAE, follows commands, + facial symmetry  EKG:atrial fib rate controlled and no other acute changes  ASSESSMENT AND PLAN PAF (paroxysmal atrial fibrillation) Continues in atrial fibrillation rate controlled though she at this point is complaining of symptoms. She is extremely weak at times and is slowly increasing volume with lower extremity edema and some mild shortness of breath. I discussed with Dr. Kelly her INR is therapeutic today and it has been for the last 3 months. Our plan will to bring her in for outpatient cardioversion.  In the meantime we'll increase the Cardizem to 240 mg daily and have asked her to take her Lasix every day instead of every other day.  I have not received her lab work from her primary care there the patient tells me her potassium had been low and Dr. Richter replaced.  She'll have repeat labs on Monday in our office so that we can have results for the cardioversion.  The patient did ask about ablation I asked her did discuss with Dr. Kelly her sister has had an ablation with Dr. Allred or has at least discussed it with Dr. our attention was interested she has had an ablation in the past with Dr. Klein in the 90s.  HYPERTENSION Controlled  Long term (current) use of anticoagulants INR therapeutic.  06/11/13  INR 2.6;   06/05/2013 INR 3.8;  05/27/2013   INR 2.8;  05/11/2013   INR 3.2;  04/27/2013   INR 3.6;   03/26/13 INR 2.5    I increased her potassium as well to 40 meg daily. If   she continues with episodes of weakness concern would be for bradycardia and an 79-year-old lady with Toprol, Cardizem and amiodarone. 

## 2013-06-19 NOTE — Anesthesia Preprocedure Evaluation (Addendum)
Anesthesia Evaluation  Patient identified by MRN, date of birth, ID band Patient awake    Reviewed: Allergy & Precautions, H&P , NPO status , Patient's Chart, lab work & pertinent test results, reviewed documented beta blocker date and time   History of Anesthesia Complications Negative for: history of anesthetic complications  Airway Mallampati: II TM Distance: >3 FB Neck ROM: Full    Dental  (+) Partial Upper and Partial Lower   Pulmonary COPD COPD inhaler, former smoker,  breath sounds clear to auscultation  Pulmonary exam normal       Cardiovascular hypertension, Pt. on medications and Pt. on home beta blockers + CAD, + Past MI, + CABG and + Peripheral Vascular Disease + dysrhythmias Atrial Fibrillation Rhythm:Irregular Rate:Normal  '13 ECHO: normal LVF 7/14 Stress test: normal perfusion, no ischemia, EF 73%   Neuro/Psych Anxiety Depression    GI/Hepatic negative GI ROS, Neg liver ROS,   Endo/Other  diabetes, Well Controlled, Type 2  Renal/GU negative Renal ROS     Musculoskeletal   Abdominal   Peds  Hematology   Anesthesia Other Findings   Reproductive/Obstetrics                         Anesthesia Physical Anesthesia Plan  ASA: III  Anesthesia Plan: General   Post-op Pain Management:    Induction: Intravenous  Airway Management Planned: Mask  Additional Equipment:   Intra-op Plan:   Post-operative Plan:   Informed Consent: I have reviewed the patients History and Physical, chart, labs and discussed the procedure including the risks, benefits and alternatives for the proposed anesthesia with the patient or authorized representative who has indicated his/her understanding and acceptance.   Dental advisory given  Plan Discussed with: CRNA and Surgeon  Anesthesia Plan Comments: (Plan routine monitors, GA for cardioversion)       Anesthesia Quick Evaluation

## 2013-06-19 NOTE — CV Procedure (Signed)
  CARDIOVERSION NOTE   Procedure: Electrical Cardioversion Indications:  Atrial Fibrillation  Procedure Details:  Consent: Risks of procedure as well as the alternatives and risks of each were explained to the (patient/caregiver).  Consent for procedure obtained.  Time Out: Verified patient identification, verified procedure, site/side was marked, verified correct patient position, special equipment/implants available, medications/allergies/relevent history reviewed, required imaging and test results available.  Performed  Patient placed on cardiac monitor, pulse oximetry, supplemental oxygen as necessary.  Sedation given: 50 mg propofol Pacer pads placed anterior and posterior chest.  Cardioverted 1 time(s).  Cardioverted at 120J.  Evaluation: Findings: Post procedure EKG shows: NSR Complications: None Patient did tolerate procedure well.  post ECG: NSR with first degree block     Lennette Bihari, MD, [facility] 06/19/2013 2:41 PM

## 2013-06-22 ENCOUNTER — Encounter (HOSPITAL_COMMUNITY): Payer: Self-pay | Admitting: Cardiovascular Disease

## 2013-07-01 ENCOUNTER — Encounter: Payer: Self-pay | Admitting: Cardiology

## 2013-07-01 ENCOUNTER — Ambulatory Visit (INDEPENDENT_AMBULATORY_CARE_PROVIDER_SITE_OTHER): Payer: Medicare Other | Admitting: Pharmacist Clinician (PhC)/ Clinical Pharmacy Specialist

## 2013-07-01 ENCOUNTER — Ambulatory Visit (INDEPENDENT_AMBULATORY_CARE_PROVIDER_SITE_OTHER): Payer: Medicare Other | Admitting: Cardiology

## 2013-07-01 VITALS — BP 140/80 | HR 72 | Ht 62.0 in | Wt 144.0 lb

## 2013-07-01 DIAGNOSIS — I4819 Other persistent atrial fibrillation: Secondary | ICD-10-CM

## 2013-07-01 DIAGNOSIS — I4891 Unspecified atrial fibrillation: Secondary | ICD-10-CM

## 2013-07-01 DIAGNOSIS — I2510 Atherosclerotic heart disease of native coronary artery without angina pectoris: Secondary | ICD-10-CM

## 2013-07-01 DIAGNOSIS — Z7901 Long term (current) use of anticoagulants: Secondary | ICD-10-CM

## 2013-07-01 DIAGNOSIS — I4892 Unspecified atrial flutter: Secondary | ICD-10-CM

## 2013-07-01 DIAGNOSIS — E119 Type 2 diabetes mellitus without complications: Secondary | ICD-10-CM

## 2013-07-01 MED ORDER — ATORVASTATIN CALCIUM 80 MG PO TABS: 80.0000 mg | ORAL_TABLET | Freq: Every evening | ORAL | Status: DC

## 2013-07-01 MED ORDER — ESOMEPRAZOLE MAGNESIUM 40 MG PO PACK: 40.0000 mg | PACK | Freq: Every day | ORAL | Status: DC

## 2013-07-01 MED ORDER — DILTIAZEM HCL ER COATED BEADS 120 MG PO CP24: 240.0000 mg | ORAL_CAPSULE | Freq: Every day | ORAL | Status: DC

## 2013-07-01 MED ORDER — HYDROCHLOROTHIAZIDE 25 MG PO TABS: 25.0000 mg | ORAL_TABLET | Freq: Every day | ORAL | Status: DC

## 2013-07-01 MED ORDER — AMIODARONE HCL 200 MG PO TABS: 200.0000 mg | ORAL_TABLET | Freq: Two times a day (BID) | ORAL | Status: DC

## 2013-07-01 NOTE — Assessment & Plan Note (Signed)
Hx of ablation by Dr. Graciela Husbands in the 90's

## 2013-07-01 NOTE — Patient Instructions (Signed)
Back in atrial fib.  Hopefully you will convert over next 2-3 weeks back to sinus rhythm.  Follow up with Dr. Tresa Endo in 2 weeks to decide further treatment.  Call if any problems.

## 2013-07-01 NOTE — Assessment & Plan Note (Signed)
She was recently cardioverted by Dr. Tresa Endo and unfortunately she presents today back in atrial fibrillation the rate is improved control at 72 beats per minute.  I called Dr. Nicholaus Bloom and discussed options at this point we'll continue the amiodarone 200 mg twice a day he'll see her back in 2 weeks giving her time hopefully to convert on her own if she does not he will discuss stopping the amiodarone and continuing on Cardizem and metoprolol 11. She continues on anticoagulation as well. She is asymptomatic today with her A. fib no shortness of breath and no discomfort at all except she does feel somewhat imbalanced,she relates this may be due to her hearing aids and will have this checked but it may be the A. fib causing her imbalance.

## 2013-07-01 NOTE — Assessment & Plan Note (Signed)
No chest pain

## 2013-07-01 NOTE — Assessment & Plan Note (Signed)
Continues, no bleeding

## 2013-07-01 NOTE — Progress Notes (Signed)
07/01/2013   PCP: Dois Davenport., MD   Chief Complaint  Patient presents with  . Follow-up    S/P cardioversion    Primary Cardiologist: Dr. Tresa Endo  HPI:  Cheyenne [name], is a 79 y.o. female who presents to the office today for follow up from last week -with complaints of atrial fib and feeling dizzy. She had recent episode of atrial fibrillation with rapid ventricular response at [facility] on 05/12/2013. At that time her Amiodarone was increased to 200 BID, her Toprol has been increased to 125 mg daily. She was also on cardizem at 120 mg and is anticoagulated. When she had follow up with Dr. Tresa Endo 05/20/13 she was in SR.  Ms. Morrish has known coronary artery disease. She suffered an inferior wall myocardial infarction in 1997 treated with PTCA of her RCA and ultimately underwent CABG surgery. She has a remote history of atrial flutter and is status post atrial flutter ablation. She also is a history of paroxysmal atrial fibrillation. She was hospitalized with PAF in July 2013. She also has a history of hyperlipidemia, hypertension, as well as GERD On 01/29/2013 she underwent a 2 year follow-up nuclear study. This continued to show normal perfusion. Post-stress ejection fraction was 73%. There is no evidence for scar or ischemia. Of note, her potassium was 3.3. at her hospitalization but now WNL.   Her cardizem was increased to 240 mg daily and she underwent DCCV with Dr. Tresa Endo 06/19/13, successfully to SR.  She remained in SR until this past Sat. And was are of irregularity of HR.  She has no SOB this time.  Maybe mild imbalance, which may be related to her hearing aids.  She is to have them evaluated soon.  Her INR today was therapeutic.  I discussed with Dr. Tresa Endo her return of atrial fib. Now persistent atrial fib.  I reordered her meds today.     Allergies  Allergen Reactions  . Elavil [Amitriptyline] Other (See Comments)    Felt drunk  . Nifedipine Other (See  Comments)    Heart races  . Tegretol [Carbamazepine] Other (See Comments)    Felt drunk    Current Outpatient Prescriptions  Medication Sig Dispense Refill  . acetaminophen (TYLENOL) 500 MG tablet Take 1,000 mg by mouth every 6 (six) hours as needed. For pain      . ALPRAZolam (XANAX) 0.25 MG tablet Take 0.25 mg by mouth 2 (two) times daily as needed for anxiety. For anxiety      . amiodarone (PACERONE) 200 MG tablet Take 1 tablet (200 mg total) by mouth 2 (two) times daily.  180 tablet  3  . atorvastatin (LIPITOR) 80 MG tablet Take 1 tablet (80 mg total) by mouth every evening.  90 tablet  3  . calcium carbonate (OS-CAL) 600 MG TABS Take 600 mg by mouth 2 (two) times daily with a meal.      . cilostazol (PLETAL) 100 MG tablet Take 100 mg by mouth 2 (two) times daily.      Marland Kitchen diltiazem (CARDIZEM CD) 120 MG 24 hr capsule Take 2 capsules (240 mg total) by mouth daily.  90 capsule  3  . esomeprazole (NEXIUM) 40 MG packet Take 40 mg by mouth daily before breakfast.  90 each  3  . ezetimibe (ZETIA) 10 MG tablet Take 10 mg by mouth every evening.       . fluticasone (FLONASE) 50 MCG/ACT nasal spray Place 2 sprays  into the nose daily.      . furosemide (LASIX) 20 MG tablet Take 20 mg by mouth every other day.      . hydrochlorothiazide (HYDRODIURIL) 25 MG tablet Take 1 tablet (25 mg total) by mouth daily.  90 tablet  3  . HYDROcodone-acetaminophen (NORCO/VICODIN) 5-325 MG per tablet Take 1 tablet by mouth every 6 (six) hours as needed. For pain      . isosorbide mononitrate (IMDUR) 30 MG 24 hr tablet Take 30 mg by mouth daily.      . Levocetirizine Dihydrochloride (XYZAL PO) Take 10 mg by mouth every evening.      . metoprolol succinate (TOPROL-XL) 100 MG 24 hr tablet Take 125 mg by mouth daily. Take with or immediately following a meal.      . Omega-3 Fatty Acids (FISH OIL) 1200 MG CAPS Take 1 capsule by mouth 2 (two) times daily.      Letta Pate DELICA LANCETS 33G MISC       . ONETOUCH VERIO test  strip       . potassium chloride SA (K-DUR,KLOR-CON) 20 MEQ tablet Take 30 mEq by mouth daily.       . psyllium (METAMUCIL) 58.6 % powder Take 1 packet by mouth daily. At lunch or supper      . ramipril (ALTACE) 10 MG tablet Take 10 mg by mouth daily.      . Tiotropium Bromide Monohydrate (SPIRIVA HANDIHALER IN) Inhale 1 capsule into the lungs daily as needed.       . warfarin (COUMADIN) 5 MG tablet Take 2.5-5 mg by mouth every evening. Take 5mg  on Wednesday and Friday. Take 2.5mg  the rest of the week.       No current facility-administered medications for this visit.    Past Medical History  Diagnosis Date  . Atrial fibrillation   . Coronary artery disease   . COPD (chronic obstructive pulmonary disease)   . Hypertension 11/04/2009    renal doppler showed abd aorta normal size  low velocities  w/ moderate amt plaque; rgt & lft kidney  normal size,renal arteries normal patency  . Anxiety   . Pneumonia     5-6 years ago  . Arthritis   . Myocardial infarction     Mild MI 1987,inferior wall  . Paroxysmal atrial fibrillation     last hospitalized July 2013  . Hyperlipidemia   . PVD (peripheral vascular disease) 09/18/2011    carotid doppler showed mild amount of fibrous plaque both carotid arteries w/o significant flow reduction or stenosis  . PVD (peripheral vascular disease) 09/18/2011    Lower extremity doppler showed ABIof 0.97 on the right,0.83 on the left.,50-60%narrowing rgt common iliac greater than 70% right SFA, right posterior tibial & peroneal arteries occlude  . PVD (peripheral vascular disease) 09/14/2010    ABI's in 2012 were 0.94 and 0.81 but velocities appeared more sigificantly changed  . PVD (peripheral vascular disease) with claudication 09/15/2007    abd aotra moder. amt plaque w/low velocities, left SFA occluded vessel, rgt prox. & mid SFA elvated  velocities ABI rgt O.87,ABI  lft 0.77   . Peripheral angiopathy 03/21/2004    total left SFA w/three vessel runoff  though tibial vessels are diffusely disease tx medically,left 70% distal lft external iliac,70% rgt common iliac, 50-60% prox and mid rgt SFA ,  . Diabetes mellitus without complication     Past Surgical History  Procedure Laterality Date  . Nasal sinus surgery    .  Quadruple  bypass    . Cholecystectomy    . Eye surgery      bilateral catracts  . Coronary angioplasty  1987    emergent,60% LAD  . Coronary artery bypass graft  03/23/1998    CABGx4 left internal mammary artery to LAD, SVG to diagonal branch to LAD, first obtuse branch Left Circ,and posterior descending branch of RCA  . Doppler echocardiography  05/29/2012    proximal septal thickening w/normal systolic and probable grade 2 diastolic dysfunction,mod LA dilatation, mild  annilar calcifcation w/mild to mod MR, mild to mod.TR , mild pulm. HTN w/upper normal pulm. pressures. mild aortic sclerosis w/o stenosis and pulm.valvular regrurgitation  . Doppler echocardiography  04/16/2011    EF =>55%, LV normal,Grade II diastolic dysfunction, aortic valve moderately sclerotic  . Doppler echocardiography  06/16/2009    EF => 55%,LV normal ,mild to moder. mitral annular calcifcation,no aortic stenosis, aortic valve appears moder.sclerotic  . Cardiac stress test  11/09/2010    EF 75%, small area of reversible lateral ischemia. SDS 2 . Extent 3%  . Cardiac stress test  11/04/2009    EF 75%, LV normal ,   . Cardiac catheterization  03/22/1998    severe four vessel coronary disease normal LV function  . Atrial flutter ablation  11/11/1996    Dr Graciela Husbands did RFablation atrial flutter  and sinus node re-entry tachycardia  . Cardioversion N/A 06/19/2013    Procedure: CARDIOVERSION;  Surgeon: Lennette Bihari, MD;  Location: [facility];  Service: Cardiovascular;  Laterality: N/A;    ZOX:[id number]:UJ colds or fevers, no weight changes Skin:no rashes or ulcers HEENT:no blurred vision, no congestion CV:see HPI PUL:see HPI GI:no diarrhea  constipation or melena, no indigestion GU:no hematuria, no dysuria MS:no joint pain, no claudication Neuro:no syncope, no lightheadedness Endo:+ diabetes, no thyroid disease  PHYSICAL EXAM BP 140/80  Pulse 72  Ht 5\' 2"  (1.575 m)  Wt 144 lb (65.318 kg)  BMI 26.33 kg/m2 General:Pleasant affect, NAD Skin:Warm and dry, brisk capillary refill HEENT:normocephalic, sclera clear, mucus membranes moist Neck:supple, no JVD, no bruits  Heart:irreg irreg without murmur, gallup, rub or click Lungs:clear without rales, rhonchi, or wheezes WJX:BJYN, non tender, + BS, do not palpate liver spleen or masses Ext:no lower ext edema, 2+ pedal pulses, 2+ radial pulses Neuro:alert and oriented, MAE, follows commands, + facial symmetry WGN:FAOZHY fib , LAD, rate 72. No acute changes.  ASSESSMENT AND PLAN Atrial fibrillation, persistent She was recently cardioverted by Dr. Tresa Endo and unfortunately she presents today back in atrial fibrillation the rate is improved control at 72 beats per minute.  I called Dr. Nicholaus Bloom and discussed options at this point we'll continue the amiodarone 200 mg twice a day he'll see her back in 2 weeks giving her time hopefully to convert on her own if she does not he will discuss stopping the amiodarone and continuing on Cardizem and metoprolol 11. She continues on anticoagulation as well. She is asymptomatic today with her A. fib no shortness of breath and no discomfort at all except she does feel somewhat imbalanced,she relates this may be due to her hearing aids and will have this checked but it may be the A. fib causing her imbalance.  Atrial flutter Hx of ablation by Dr. Graciela Husbands in the 90's  CAD (coronary artery disease) No chest pain  DIABETES MELLITUS Stable   Long term (current) use of anticoagulants Continues, no bleeding

## 2013-07-01 NOTE — Assessment & Plan Note (Signed)
Stable

## 2013-07-09 ENCOUNTER — Telehealth: Payer: Self-pay | Admitting: Cardiovascular Disease

## 2013-07-09 ENCOUNTER — Other Ambulatory Visit: Payer: Self-pay | Admitting: *Deleted

## 2013-07-09 MED ORDER — ESOMEPRAZOLE MAGNESIUM 40 MG PO CPDR: 40.0000 mg | DELAYED_RELEASE_CAPSULE | Freq: Every day | ORAL | Status: DC

## 2013-07-09 NOTE — Telephone Encounter (Signed)
Called patient to clarify if taking packet or capsule Nexium - patient states has been take capsules. Called pharmacy to inform to refill the capsule script and cancel script for packet.

## 2013-07-09 NOTE — Telephone Encounter (Signed)
Returning your call. °

## 2013-07-09 NOTE — Telephone Encounter (Signed)
Left message to call back to clarify formulation of Nexium. Vernona Rieger NP ordered packet however previous refills were for capsules.

## 2013-07-09 NOTE — Telephone Encounter (Signed)
Rx was sent to pharmacy electronically. 

## 2013-07-09 NOTE — Telephone Encounter (Signed)
Have 2 different rxs for nexium  One from Vernona Rieger and One from Dr Tresa Endo  One is powder and one is caps  Both 40 mgs.  Please call as to which one to fill

## 2013-07-15 ENCOUNTER — Encounter: Payer: Self-pay | Admitting: Cardiovascular Disease

## 2013-07-15 ENCOUNTER — Ambulatory Visit (INDEPENDENT_AMBULATORY_CARE_PROVIDER_SITE_OTHER): Payer: Medicare Other | Admitting: Cardiovascular Disease

## 2013-07-15 VITALS — BP 138/66 | HR 73 | Ht 62.0 in | Wt 147.6 lb

## 2013-07-15 DIAGNOSIS — I2510 Atherosclerotic heart disease of native coronary artery without angina pectoris: Secondary | ICD-10-CM

## 2013-07-15 DIAGNOSIS — I4891 Unspecified atrial fibrillation: Secondary | ICD-10-CM

## 2013-07-15 DIAGNOSIS — I4892 Unspecified atrial flutter: Secondary | ICD-10-CM

## 2013-07-15 DIAGNOSIS — I10 Essential (primary) hypertension: Secondary | ICD-10-CM

## 2013-07-15 DIAGNOSIS — I480 Paroxysmal atrial fibrillation: Secondary | ICD-10-CM

## 2013-07-15 DIAGNOSIS — E785 Hyperlipidemia, unspecified: Secondary | ICD-10-CM

## 2013-07-15 DIAGNOSIS — I4819 Other persistent atrial fibrillation: Secondary | ICD-10-CM

## 2013-07-15 DIAGNOSIS — Z7901 Long term (current) use of anticoagulants: Secondary | ICD-10-CM

## 2013-07-15 DIAGNOSIS — E119 Type 2 diabetes mellitus without complications: Secondary | ICD-10-CM

## 2013-07-15 NOTE — Patient Instructions (Addendum)
Your physician has recommended you make the following change in your medication: decrease the amiodarone to 200mg   Daily for 2 weeks, then 100mg  for 2 weeks, then stop.  Increase the toprol to 1 & 1/2 tablet daily. ( 150 mg). If heart rate increases then go up to 175 mg.  Your physician recommends that you schedule a follow-up appointment in: 6 WEEKS.

## 2013-07-27 ENCOUNTER — Encounter: Payer: Self-pay | Admitting: Cardiovascular Disease

## 2013-07-27 NOTE — Progress Notes (Signed)
Patient ID: Cheyenne [name], female   DOB: [birth year], 79 y.o.   MRN: [id number]       HPI: Cheyenne [name], is a 79 y.o. female who presents to the office today for cardiology evaluation and followup of her recurrent  atrial fibrillation with rapid ventricular response and recent cardioversion on 06/19/13.  Cheyenne [name] has known coronary artery disease. She suffered an inferior wall myocardial infarction in 1997 treated with PTCA of her RCA and ultimately underwent CABG surgery. She has a remote history of atrial flutter and is status post atrial flutter ablation. She also is a history of paroxysmal atrial fibrillation. She was hospitalized with PAF in July 2013. She also has a history of hyperlipidemia, hypertension, as well as GERD  On 01/29/2013 she underwent a 2 year follow-up nuclear study. This continued to show normal perfusion. Post-stress ejection fraction was 73%. There is no evidence for scar or ischemia.  On 05/12/2013, Cheyenne [name] apparently developed another episode of atrial fibrillation with rapid ventricular response leading to her [facility] hospital evaluation. She has been on chronic anticoagulation. She had taken additional doses of Lopressor prior to her evaluation. Her amiodarone dose was adjusted and ultimately she converted to sinus rhythm. Of note, her potassium was 3.3. She underwent successful cardioversion with restoration of sinus rhythm on 06/19/2013. She had felt well initially, but was seen in the office on 07/01/2013 bynurse practitioner, Cheyenne [name] At that time she again was back in atrial fibrillation. Her amiodarone was further increased to 400 mg daily she presents now for followup evaluation.  Cheyenne [name] is unaware of any rhythm abnormality. She denies chest pain. She denies shortness of breath. She denies dizziness or presyncope or syncope.    Past Medical History  Diagnosis Date  . Atrial fibrillation   . Coronary artery disease   . COPD (chronic obstructive pulmonary  disease)   . Hypertension 11/04/2009    renal doppler showed abd aorta normal size  low velocities  w/ moderate amt plaque; rgt & lft kidney  normal size,renal arteries normal patency  . Anxiety   . Pneumonia     5-6 years ago  . Arthritis   . Myocardial infarction     Mild MI 1987,inferior wall  . Paroxysmal atrial fibrillation     last hospitalized July 2013  . Hyperlipidemia   . PVD (peripheral vascular disease) 09/18/2011    carotid doppler showed mild amount of fibrous plaque both carotid arteries w/o significant flow reduction or stenosis  . PVD (peripheral vascular disease) 09/18/2011    Lower extremity doppler showed ABIof 0.97 on the right,0.83 on the left.,50-60%narrowing rgt common iliac greater than 70% right SFA, right posterior tibial & peroneal arteries occlude  . PVD (peripheral vascular disease) 09/14/2010    ABI's in 2012 were 0.94 and 0.81 but velocities appeared more sigificantly changed  . PVD (peripheral vascular disease) with claudication 09/15/2007    abd aotra moder. amt plaque w/low velocities, left SFA occluded vessel, rgt prox. & mid SFA elvated  velocities ABI rgt O.87,ABI  lft 0.77   . Peripheral angiopathy 03/21/2004    total left SFA w/three vessel runoff though tibial vessels are diffusely disease tx medically,left 70% distal lft external iliac,70% rgt common iliac, 50-60% prox and mid rgt SFA ,  . Diabetes mellitus without complication     Past Surgical History  Procedure Laterality Date  . Nasal sinus surgery    . Quadruple  bypass    . Cholecystectomy    .  Eye surgery      bilateral catracts  . Coronary angioplasty  1987    emergent,60% LAD  . Coronary artery bypass graft  03/23/1998    CABGx4 left internal mammary artery to LAD, SVG to diagonal branch to LAD, first obtuse branch Left Circ,and posterior descending branch of RCA  . Doppler echocardiography  05/29/2012    proximal septal thickening w/normal systolic and probable grade 2 diastolic  dysfunction,mod LA dilatation, mild  annilar calcifcation w/mild to mod MR, mild to mod.TR , mild pulm. HTN w/upper normal pulm. pressures. mild aortic sclerosis w/o stenosis and pulm.valvular regrurgitation  . Doppler echocardiography  04/16/2011    EF =>55%, LV normal,Grade II diastolic dysfunction, aortic valve moderately sclerotic  . Doppler echocardiography  06/16/2009    EF => 55%,LV normal ,mild to moder. mitral annular calcifcation,no aortic stenosis, aortic valve appears moder.sclerotic  . Cardiac stress test  11/09/2010    EF 75%, small area of reversible lateral ischemia. SDS 2 . Extent 3%  . Cardiac stress test  11/04/2009    EF 75%, LV normal ,   . Cardiac catheterization  03/22/1998    severe four vessel coronary disease normal LV function  . Atrial flutter ablation  11/11/1996    Dr Graciela Husbands did RFablation atrial flutter  and sinus node re-entry tachycardia  . Cardioversion N/A 06/19/2013    Procedure: CARDIOVERSION;  Surgeon: Lennette Bihari, MD;  Location: [facility] ENDOSCOPY;  Service: Cardiovascular;  Laterality: N/A;    Allergies  Allergen Reactions  . Elavil [Amitriptyline] Other (See Comments)    Felt drunk  . Nifedipine Other (See Comments)    Heart races  . Tegretol [Carbamazepine] Other (See Comments)    Felt drunk    Current Outpatient Prescriptions  Medication Sig Dispense Refill  . acetaminophen (TYLENOL) 500 MG tablet Take 1,000 mg by mouth every 6 (six) hours as needed. For pain      . ALPRAZolam (XANAX) 0.25 MG tablet Take 0.25 mg by mouth 2 (two) times daily as needed for anxiety. For anxiety      . amiodarone (PACERONE) 200 MG tablet Take 1 tablet (200 mg total) by mouth 2 (two) times daily.  180 tablet  3  . atorvastatin (LIPITOR) 80 MG tablet Take 1 tablet (80 mg total) by mouth every evening.  90 tablet  3  . calcium carbonate (OS-CAL) 600 MG TABS Take 600 mg by mouth 2 (two) times daily with a meal.      . cilostazol (PLETAL) 100 MG tablet Take 100 mg by  mouth 2 (two) times daily.      Marland Kitchen diltiazem (CARDIZEM CD) 120 MG 24 hr capsule Take 2 capsules (240 mg total) by mouth daily.  90 capsule  3  . esomeprazole (NEXIUM) 40 MG capsule Take 1 capsule (40 mg total) by mouth daily at 12 noon.  90 capsule  3  . esomeprazole (NEXIUM) 40 MG packet Take 40 mg by mouth daily before breakfast.  90 each  3  . ezetimibe (ZETIA) 10 MG tablet Take 10 mg by mouth every evening.       . fluticasone (FLONASE) 50 MCG/ACT nasal spray Place 2 sprays into the nose daily.      . furosemide (LASIX) 20 MG tablet Take 20 mg by mouth daily.       . hydrochlorothiazide (HYDRODIURIL) 25 MG tablet Take 1 tablet (25 mg total) by mouth daily.  90 tablet  3  . HYDROcodone-acetaminophen (NORCO/VICODIN) 5-325 MG per tablet  Take 1 tablet by mouth every 6 (six) hours as needed. For pain      . isosorbide mononitrate (IMDUR) 30 MG 24 hr tablet Take 30 mg by mouth daily.      . Levocetirizine Dihydrochloride (XYZAL PO) Take 10 mg by mouth every evening.      . metoprolol succinate (TOPROL-XL) 100 MG 24 hr tablet Take 125 mg by mouth daily. Take with or immediately following a meal.      . Omega-3 Fatty Acids (FISH OIL) 1200 MG CAPS Take 1 capsule by mouth 2 (two) times daily.      . potassium chloride SA (K-DUR,KLOR-CON) 20 MEQ tablet Take 40 mEq by mouth daily.       . psyllium (METAMUCIL) 58.6 % powder Take 1 packet by mouth daily. At lunch or supper      . ramipril (ALTACE) 10 MG tablet Take 10 mg by mouth daily.      . Tiotropium Bromide Monohydrate (SPIRIVA HANDIHALER IN) Inhale 1 capsule into the lungs daily as needed.       . warfarin (COUMADIN) 5 MG tablet Take 2.5-5 mg by mouth every evening. Take 5mg  on Wednesday and Friday. Take 2.5mg  the rest of the week.       No current facility-administered medications for this visit.    Socially she is widowed for 4 years. She has 3 children and one grandchild. She has a remote tobacco history but quit in 1999. There is no alcohol use.  She was born in [place]. She moved to [place] in [place] after WW[place].  ROS is negative for fevers, chills or night sweats. She denies skin rash. She denies change in vision or hearing. There is a remote history of cataract. She denies lymphadenopathy. She denies tremor. She denies PND or orthopnea. She is unaware of episodes of tachycardia or palpitations since her hospital evaluation. She denies chest pressure. There is no abdominal pain. She denies bleeding;There are no GU symptoms. She denies significant edema.  She denies paresthesias. She denies sleep issues. She denies residual daytime sleepiness. She denies musculoskeletal pain. She does have a history of glucose intolerance. She denies thyroid abnormalities per Other comprehensive 14 point system review is negative.  PE BP 138/66  Pulse 73  Ht 5\' 2"  (1.575 m)  Wt 147 lb 9.6 oz (66.951 kg)  BMI 26.99 kg/m2  General: Alert, oriented, no distress.  Skin: normal turgor, no rashes HEENT: Normocephalic, atraumatic. Pupils round and reactive; sclera anicteric;no lid lag.  Nose without nasal septal hypertrophy Mouth/Parynx benign; Mallinpatti scale 3 Neck: No JVD, no carotid briuts Chest wall: No tenderness to palpation Lungs: clear to ausculatation and percussion; no wheezing or rales Heart: Irregularly irregular rhythm with a ventricular rate in the 70s;  s1 s2 normal 1/6 systolic murmur Abdomen: soft, nontender; no hepatosplenomehaly, BS+; abdominal aorta nontender and not dilated by palpation. Pulses 2+ Extremities: no clubbing cyanosis or edema, Homan's sign negative  Neurologic: grossly nonfocal Psychological: Normal affect and mood. Normal cognition.   ECG: Atrial fibrillation with ventricular response of 73. QTc interval 469 ms.  LABS:  BMET    Component Value Date/Time   NA 135 06/15/2013 1031   K 3.7 06/15/2013 1031   CL 97 06/15/2013 1031   CO2 29 06/15/2013 1031   GLUCOSE 132* 06/15/2013 1031   BUN 16 06/15/2013 1031    CREATININE 1.27* 06/15/2013 1031   CREATININE 1.09 05/12/2013 0911   CALCIUM 9.1 06/15/2013 1031   GFRNONAA 45* 05/12/2013  0911   GFRAA 52* 05/12/2013 0911     Hepatic Function Panel     Component Value Date/Time   PROT 6.8 03/05/2013 1236   ALBUMIN 3.8 03/05/2013 1236   AST 31 03/05/2013 1236   ALT 37* 03/05/2013 1236   ALKPHOS 72 03/05/2013 1236   BILITOT 0.7 03/05/2013 1236   BILIDIR 0.2 02/20/2010 1139     CBC    Component Value Date/Time   WBC 8.4 06/15/2013 1042   RBC 4.57 06/15/2013 1042   HGB 14.3 06/15/2013 1042   HCT 42.2 06/15/2013 1042   PLT 259 06/15/2013 1042   MCV 92.3 06/15/2013 1042   MCH 31.3 06/15/2013 1042   MCHC 33.9 06/15/2013 1042   RDW 13.8 06/15/2013 1042   LYMPHSABS 1.1 06/15/2013 1042   MONOABS 0.8 06/15/2013 1042   EOSABS 0.0 06/15/2013 1042   BASOSABS 0.0 06/15/2013 1042     BNP No results found for this basename: probnp    Lipid Panel  No results found for this basename: chol,  trig,  hdl,  cholhdl,  vldl,  ldlcalc     RADIOLOGY: No results found.    ASSESSMENT AND PLAN: Cheyenne [name] continues is 17 years following her inferior wall myocardial infarction treated with PTCA the right coronary artery and 15 years status post successful CABG revascularization surgery. Her most recent nuclear perfusion study is unchanged from 2 years previously.  She had recently developed an episode of recurrent atrial fibrillation with rapid ventricular response which did respond to increased beta blocker therapy as well as amiodarone dosing. She also developed recurrent AF resulting in increased Cardizem dosing and subsequent successful DC cardioversion which was done of her 14 2014. She again developed recurrent AF despite being on rate control medications as well as amiodarone. For the last several weeks she's been back on the amiodarone at 40 mg daily. Her EKG today again continues to show no persistent atrial fibrillation with a ventricular response in the  70s. She remains asymptomatic relative to this. After much discussion, I made a decision with Cheyenne [name] to ultimately weaned and discontinued her antiarrhythmic therapy and continue with rate control. She will reduce her amiodarone to 200 mg for the next 2 weeks and then 100 mg for 2 weeks then discontinue this. I am increasing her Toprol to 150 mg daily for her present dose of 25 mg. If she notes her pulse continues to be increased to further titrate this to 175 mg daily. I will see her back in the office in 6 weeks for followup evaluation and further recommendations will made at that time.   Lennette Bihari, MD, [facility]  07/27/2013 10:29 AM

## 2013-08-03 ENCOUNTER — Other Ambulatory Visit: Payer: Self-pay | Admitting: Pharmacist Clinician (PhC)/ Clinical Pharmacy Specialist

## 2013-08-03 ENCOUNTER — Ambulatory Visit (INDEPENDENT_AMBULATORY_CARE_PROVIDER_SITE_OTHER): Payer: Medicare Other | Admitting: Pharmacist Clinician (PhC)/ Clinical Pharmacy Specialist

## 2013-08-03 VITALS — BP 120/70 | HR 80

## 2013-08-03 DIAGNOSIS — I4891 Unspecified atrial fibrillation: Secondary | ICD-10-CM

## 2013-08-03 DIAGNOSIS — Z5181 Encounter for therapeutic drug level monitoring: Secondary | ICD-10-CM

## 2013-08-03 DIAGNOSIS — Z7901 Long term (current) use of anticoagulants: Secondary | ICD-10-CM

## 2013-08-03 LAB — POCT INR: INR: 2.7

## 2013-08-03 MED ORDER — HYDROCHLOROTHIAZIDE 25 MG PO TABS: 25.0000 mg | ORAL_TABLET | Freq: Every day | ORAL | Status: DC

## 2013-08-03 MED ORDER — METOPROLOL SUCCINATE ER 100 MG PO TB24: ORAL_TABLET | ORAL | Status: DC

## 2013-08-03 MED ORDER — DILTIAZEM HCL ER COATED BEADS 120 MG PO CP24: 240.0000 mg | ORAL_CAPSULE | Freq: Every day | ORAL | Status: DC

## 2013-08-07 ENCOUNTER — Telehealth (HOSPITAL_COMMUNITY): Payer: Self-pay | Admitting: *Deleted

## 2013-08-10 ENCOUNTER — Telehealth (HOSPITAL_COMMUNITY): Payer: Self-pay | Admitting: *Deleted

## 2013-08-19 IMAGING — CR DG CHEST 1V PORT
1 series · 1 of 1 positions shown · non-contrast
Comparison: CT chest dated 02/15/2010

CLINICAL DATA: Atrial fibrillation

PORTABLE CHEST - 1 VIEW

[AP]
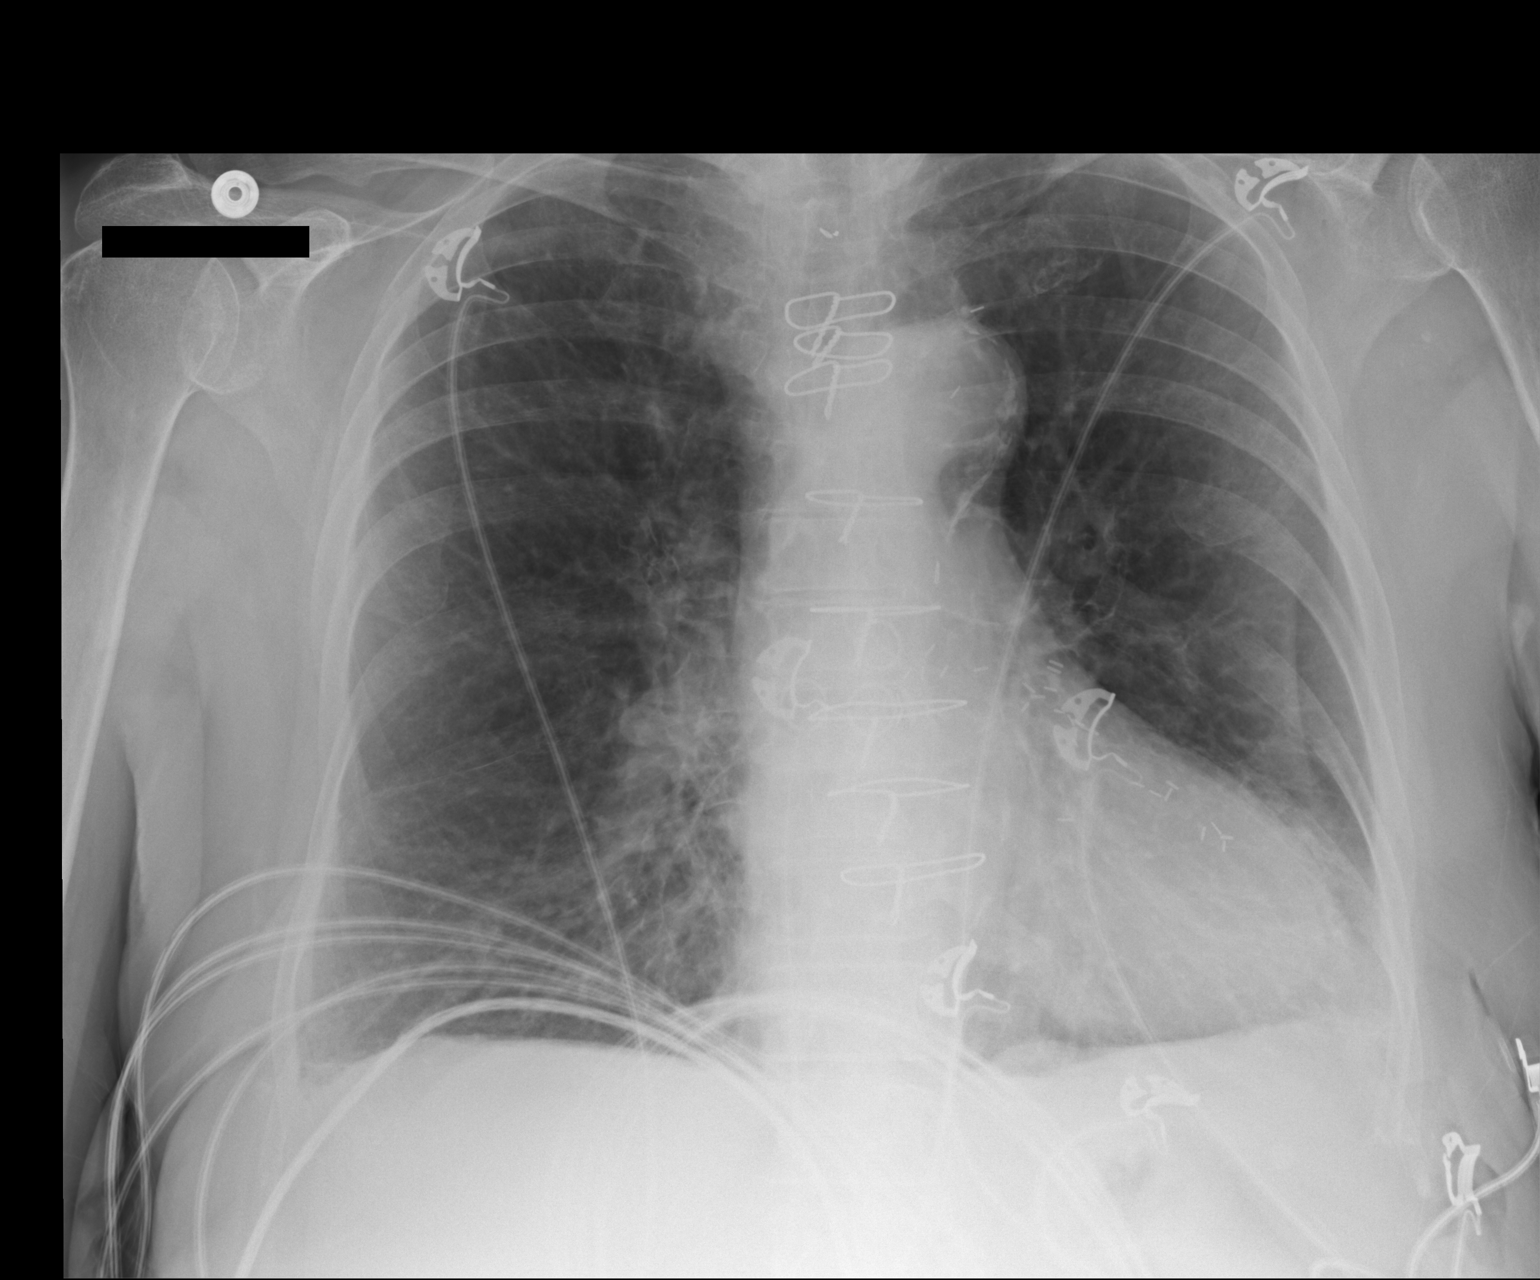

[1 of 1 positions shown; findings below may reference images not displayed]

FINDINGS: Chronic interstitial markings with bilateral lower lobe
scarring versus atelectasis.

The heart is top normal in size. Postsurgical changes related to
prior CABG.
IMPRESSION: No evidence of acute cardiopulmonary disease.

Chronic interstitial markings.

## 2013-08-24 ENCOUNTER — Telehealth: Payer: Self-pay | Admitting: Cardiovascular Disease

## 2013-08-24 MED ORDER — DILTIAZEM HCL ER COATED BEADS 120 MG PO CP24: 240.0000 mg | ORAL_CAPSULE | Freq: Every day | ORAL | Status: DC

## 2013-08-24 NOTE — Telephone Encounter (Signed)
Returned call and pt verified x 2.  Pt stated she needs a refill on Cardizem.  Stated she received a shipment of amiodarone and Dr. Claiborne Billings weaned her off of that at the last appt.  Pt informed script was last written in Nov. 2014 and pharmacy may have filled it when it was due.  Pt verbalized understanding.  Pt also informed script for diltiazem was not sent w/ others at last OV and RN will send now.  Pt also informed RN will send a message to pharmacy that amiodarone was dc'd.  Pt verbalized understanding and agreed w/ plan.  Refill(s) sent to pharmacy for diltiazem.

## 2013-08-24 NOTE — Telephone Encounter (Signed)
Returned call.  Line busy x 2.  Will try again later.  

## 2013-08-24 NOTE — Telephone Encounter (Signed)
Has been a mix up in her medication refills.  Please call.

## 2013-08-26 ENCOUNTER — Telehealth: Payer: Self-pay | Admitting: Cardiovascular Disease

## 2013-08-26 NOTE — Telephone Encounter (Signed)
Reference#-[id number] Need to verify the Prescription for the Diltiazem.

## 2013-08-27 NOTE — Telephone Encounter (Signed)
Returned call and informed amiodarone was just shipped to pt recently.  RN informed office is aware of this as pt called to inform us.  Informed pt is aware that she is NOT to take amiodarone.  Verbalized understanding.

## 2013-08-27 NOTE — Telephone Encounter (Signed)
Returned call.  Office closed until 9am.

## 2013-08-31 ENCOUNTER — Encounter: Payer: Self-pay | Admitting: Cardiovascular Disease

## 2013-08-31 ENCOUNTER — Ambulatory Visit (INDEPENDENT_AMBULATORY_CARE_PROVIDER_SITE_OTHER): Payer: Medicare Other | Admitting: Pharmacist Clinician (PhC)/ Clinical Pharmacy Specialist

## 2013-08-31 ENCOUNTER — Ambulatory Visit (INDEPENDENT_AMBULATORY_CARE_PROVIDER_SITE_OTHER): Payer: Medicare Other | Admitting: Cardiovascular Disease

## 2013-08-31 VITALS — BP 110/70 | HR 87 | Ht 62.0 in | Wt 144.4 lb

## 2013-08-31 DIAGNOSIS — J449 Chronic obstructive pulmonary disease, unspecified: Secondary | ICD-10-CM

## 2013-08-31 DIAGNOSIS — I4891 Unspecified atrial fibrillation: Secondary | ICD-10-CM

## 2013-08-31 DIAGNOSIS — I10 Essential (primary) hypertension: Secondary | ICD-10-CM

## 2013-08-31 DIAGNOSIS — Z7901 Long term (current) use of anticoagulants: Secondary | ICD-10-CM

## 2013-08-31 DIAGNOSIS — J4489 Other specified chronic obstructive pulmonary disease: Secondary | ICD-10-CM

## 2013-08-31 DIAGNOSIS — I4819 Other persistent atrial fibrillation: Secondary | ICD-10-CM

## 2013-08-31 DIAGNOSIS — I4892 Unspecified atrial flutter: Secondary | ICD-10-CM

## 2013-08-31 LAB — POCT INR: INR: 3.6

## 2013-08-31 MED ORDER — CILOSTAZOL 100 MG PO TABS: 100.0000 mg | ORAL_TABLET | Freq: Two times a day (BID) | ORAL | Status: DC

## 2013-08-31 MED ORDER — METOPROLOL SUCCINATE ER 100 MG PO TB24: ORAL_TABLET | ORAL | Status: DC

## 2013-08-31 NOTE — Patient Instructions (Signed)
Your physician has recommended you make the following change in your medication: take metoprolol as directed per Dr. Claiborne Billings. On even days 100 mg twice daily. On odd days take 100 mg in the morning and 50 mg in pm.  Your physician recommends that you schedule a follow-up appointment in: 4 MONTHS.

## 2013-08-31 NOTE — Progress Notes (Signed)
Patient ID: De Nurse, female   DOB: [birth year], 80 y.o.   MRN: [id number]        HPI: Cheyenne [name], is a 80 y.o. female who presents to the office today for cardiology evaluation.  [name] has known coronary artery disease. She suffered an inferior wall myocardial infarction in 1997 treated with PTCA of her RCA and ultimately underwent CABG surgery. She has a remote history of atrial flutter and is status post atrial flutter ablation. She also is a history of paroxysmal atrial fibrillation. She was hospitalized with PAF in July 2013. She also has a history of hyperlipidemia, hypertension, as well as GERD  On 01/29/2013 she underwent a 2 year follow-up nuclear study. This continued to show normal perfusion. Post-stress ejection fraction was 73%. There is no evidence for scar or ischemia.  On 05/12/2013, [name] apparently developed another episode of atrial fibrillation with rapid ventricular response leading to her [facility] hospital evaluation. She has been on chronic anticoagulation. She had taken additional doses of Lopressor prior to her evaluation. Her amiodarone dose was adjusted and ultimately she converted to sinus rhythm. Of note, her potassium was 3.3. She underwent successful cardioversion with restoration of sinus rhythm on 06/19/2013. She had felt well initially, but was seen in the office on 07/01/2013 bynurse practitioner, Cecilie Kicks At that time she again was back in atrial fibrillation. Her amiodarone was further increased to 400 mg daily she presents now for followup evaluation.  I last saw [name]  she was unaware of any rhythm abnormality. However, she again had reverted back into atrial fibrillation. She denied chest pain, shortness of breath, dizziness, presyncope or syncope. At that time, I recommended a gradual dose reduction and ultimate discontinuance of her amiodarone therapy and further titrate her Toprol XL to 150 mg daily for improved rate control. She has been  unaware of significant increased heart rates. She does have a blister on her left forearm and is on Coumadin anticoagulation and if this needs to be excised she may require transient short duration discontinuance of Coumadin. She presents for evaluation.    Past Medical History  Diagnosis Date  . Atrial fibrillation   . Coronary artery disease   . COPD (chronic obstructive pulmonary disease)   . Hypertension 11/04/2009    renal doppler showed abd aorta normal size  low velocities  w/ moderate amt plaque; rgt & lft kidney  normal size,renal arteries normal patency  . Anxiety   . Pneumonia     5-6 years ago  . Arthritis   . Myocardial infarction     Mild MI 1987,inferior wall  . Paroxysmal atrial fibrillation     last hospitalized July 2013  . Hyperlipidemia   . PVD (peripheral vascular disease) 09/18/2011    carotid doppler showed mild amount of fibrous plaque both carotid arteries w/o significant flow reduction or stenosis  . PVD (peripheral vascular disease) 09/18/2011    Lower extremity doppler showed ABIof 0.97 on the right,0.83 on the left.,50-60%narrowing rgt common iliac greater than 70% right SFA, right posterior tibial & peroneal arteries occlude  . PVD (peripheral vascular disease) 09/14/2010    ABI's in 2012 were 0.94 and 0.81 but velocities appeared more sigificantly changed  . PVD (peripheral vascular disease) with claudication 09/15/2007    abd aotra moder. amt plaque w/low velocities, left SFA occluded vessel, rgt prox. & mid SFA elvated  velocities ABI rgt O.87,ABI  lft 0.77   . Peripheral angiopathy 03/21/2004    total  left SFA w/three vessel runoff though tibial vessels are diffusely disease tx medically,left 70% distal lft external iliac,70% rgt common iliac, 50-60% prox and mid rgt SFA ,  . Diabetes mellitus without complication     Past Surgical History  Procedure Laterality Date  . Nasal sinus surgery    . Quadruple  bypass    . Cholecystectomy    . Eye  surgery      bilateral catracts  . Coronary angioplasty  1987    emergent,60% LAD  . Coronary artery bypass graft  03/23/1998    CABGx4 left internal mammary artery to LAD, SVG to diagonal branch to LAD, first obtuse branch Left Circ,and posterior descending branch of RCA  . Doppler echocardiography  05/29/2012    proximal septal thickening w/normal systolic and probable grade 2 diastolic dysfunction,mod LA dilatation, mild  annilar calcifcation w/mild to mod MR, mild to mod.TR , mild pulm. HTN w/upper normal pulm. pressures. mild aortic sclerosis w/o stenosis and pulm.valvular regrurgitation  . Doppler echocardiography  04/16/2011    EF =>55%, LV normal,Grade II diastolic dysfunction, aortic valve moderately sclerotic  . Doppler echocardiography  06/16/2009    EF => 55%,LV normal ,mild to moder. mitral annular calcifcation,no aortic stenosis, aortic valve appears moder.sclerotic  . Cardiac stress test  11/09/2010    EF 75%, small area of reversible lateral ischemia. SDS 2 . Extent 3%  . Cardiac stress test  11/04/2009    EF 75%, LV normal ,   . Cardiac catheterization  03/22/1998    severe four vessel coronary disease normal LV function  . Atrial flutter ablation  11/11/1996    Dr Caryl Comes did RFablation atrial flutter  and sinus node re-entry tachycardia  . Cardioversion N/A 06/19/2013    Procedure: CARDIOVERSION;  Surgeon: Troy Sine, MD;  Location: [facility] ENDOSCOPY;  Service: Cardiovascular;  Laterality: N/A;    Allergies  Allergen Reactions  . Elavil [Amitriptyline] Other (See Comments)    Felt drunk  . Nifedipine Other (See Comments)    Heart races  . Tegretol [Carbamazepine] Other (See Comments)    Felt drunk    Current Outpatient Prescriptions  Medication Sig Dispense Refill  . acetaminophen (TYLENOL) 500 MG tablet Take 1,000 mg by mouth every 6 (six) hours as needed. For pain      . ALPRAZolam (XANAX) 0.25 MG tablet Take 0.25 mg by mouth 2 (two) times daily as needed for  anxiety. For anxiety      . atorvastatin (LIPITOR) 80 MG tablet Take 1 tablet (80 mg total) by mouth every evening.  90 tablet  3  . calcium carbonate (OS-CAL) 600 MG TABS Take 600 mg by mouth 2 (two) times daily with a meal.      . cilostazol (PLETAL) 100 MG tablet Take 1 tablet (100 mg total) by mouth 2 (two) times daily.  180 tablet  3  . diltiazem (DILACOR XR) 240 MG 24 hr capsule Take 240 mg by mouth daily.      Marland Kitchen esomeprazole (NEXIUM) 40 MG capsule Take 1 capsule (40 mg total) by mouth daily at 12 noon.  90 capsule  3  . ezetimibe (ZETIA) 10 MG tablet Take 10 mg by mouth every evening.       . fluticasone (FLONASE) 50 MCG/ACT nasal spray Place 2 sprays into the nose daily.      . furosemide (LASIX) 20 MG tablet Take 20 mg by mouth daily.       . hydrochlorothiazide (HYDRODIURIL) 25 MG tablet  Take 1 tablet (25 mg total) by mouth daily.  90 tablet  2  . HYDROcodone-acetaminophen (NORCO/VICODIN) 5-325 MG per tablet Take 1 tablet by mouth every 6 (six) hours as needed. For pain      . isosorbide mononitrate (IMDUR) 30 MG 24 hr tablet Take 30 mg by mouth daily.      . Levocetirizine Dihydrochloride (XYZAL PO) Take 10 mg by mouth every evening.      . metoprolol succinate (TOPROL-XL) 100 MG 24 hr tablet Take 1 & 1/2 tablets by mouth once daily  135 tablet  2  . Omega-3 Fatty Acids (FISH OIL) 1200 MG CAPS Take 1 capsule by mouth 2 (two) times daily.      . potassium chloride SA (K-DUR,KLOR-CON) 20 MEQ tablet Take 40 mEq by mouth daily.       . psyllium (METAMUCIL) 58.6 % powder Take 1 packet by mouth daily. At lunch or supper      . ramipril (ALTACE) 10 MG tablet Take 10 mg by mouth daily.      . Tiotropium Bromide Monohydrate (SPIRIVA HANDIHALER IN) Inhale 1 capsule into the lungs daily as needed.       . warfarin (COUMADIN) 5 MG tablet Take 2.5-5 mg by mouth every evening. Take 5mg  on Wednesday and Friday. Take 2.5mg  the rest of the week.       No current facility-administered medications for  this visit.    Socially she is widowed for 4 years. She has 3 children and one grandchild. She has a remote tobacco history but quit in 1999. There is no alcohol use. She was born in [place]. She moved to [place] in [place] after WW[place].  ROS is negative for fevers, chills or night sweats. She has a blister on her left forearm with some mild erythema. She will be seeing her physician for this. She denies change in vision or hearing. There is a remote history of cataract. She denies lymphadenopathy. She denies tremor. She denies PND or orthopnea. She is unaware of episodes of tachycardia or palpitations since her hospital evaluation. She denies chest pressure. There is no abdominal pain. She denies bleeding;There are no GU symptoms. She denies significant edema.  She denies paresthesias. She denies sleep issues. She denies residual daytime sleepiness. She denies musculoskeletal pain. She does have a history of glucose intolerance. She denies thyroid abnormalities. Other comprehensive 14 point system review is negative.  PE BP 110/70  Pulse 87  Ht 5\' 2"  (1.575 m)  Wt 144 lb 6.4 oz (65.499 kg)  BMI 26.40 kg/m2  General: Alert, oriented, no distress.  Skin: normal turgor, blister or lesion on the left forearm with mild induration below this. HEENT: Normocephalic, atraumatic. Pupils round and reactive; sclera anicteric;no lid lag.  Nose without nasal septal hypertrophy Mouth/Parynx benign; Mallinpatti scale 3 Neck: No JVD, no carotid bruits; normal carotid upstroke Chest wall: No tenderness to palpation Lungs: clear to ausculatation and percussion; no wheezing or rales Heart: Irregularly irregular rhythm with a ventricular rate in the 80-90's;  s1 s2 normal 1/6 systolic murmur  Abdomen: soft, nontender; no hepatosplenomehaly, BS+; abdominal aorta nontender and not dilated by palpation. Back: No CVA tenderness Pulses 2+ Extremities: no clubbing cyanosis or edema, Homan's sign negative    Neurologic: grossly nonfocal Psychological: Normal affect and mood. Normal cognition.   ECG (independently read by me): Atrial fibrillation with ventricular response of 87. QTc interval 495 ms. when compared to her prior ECG atrial fibrillation rate has increased to  87 from 73 average rate.  LABS:  BMET    Component Value Date/Time   NA 135 06/15/2013 1031   K 3.7 06/15/2013 1031   CL 97 06/15/2013 1031   CO2 29 06/15/2013 1031   GLUCOSE 132* 06/15/2013 1031   BUN 16 06/15/2013 1031   CREATININE 1.27* 06/15/2013 1031   CREATININE 1.09 05/12/2013 0911   CALCIUM 9.1 06/15/2013 1031   GFRNONAA 45* 05/12/2013 0911   GFRAA 52* 05/12/2013 0911     Hepatic Function Panel     Component Value Date/Time   PROT 6.8 03/05/2013 1236   ALBUMIN 3.8 03/05/2013 1236   AST 31 03/05/2013 1236   ALT 37* 03/05/2013 1236   ALKPHOS 72 03/05/2013 1236   BILITOT 0.7 03/05/2013 1236   BILIDIR 0.2 02/20/2010 1139     CBC    Component Value Date/Time   WBC 8.4 06/15/2013 1042   RBC 4.57 06/15/2013 1042   HGB 14.3 06/15/2013 1042   HCT 42.2 06/15/2013 1042   PLT 259 06/15/2013 1042   MCV 92.3 06/15/2013 1042   MCH 31.3 06/15/2013 1042   MCHC 33.9 06/15/2013 1042   RDW 13.8 06/15/2013 1042   LYMPHSABS 1.1 06/15/2013 1042   MONOABS 0.8 06/15/2013 1042   EOSABS 0.0 06/15/2013 1042   BASOSABS 0.0 06/15/2013 1042     BNP No results found for this basename: probnp    Lipid Panel  No results found for this basename: chol,  trig,  hdl,  cholhdl,  vldl,  ldlcalc     RADIOLOGY: No results found.    ASSESSMENT AND PLAN: Ms. Hottel continues is 17 years following her inferior wall myocardial infarction treated with PTCA the right coronary artery and 15 years status post successful CABG revascularization surgery. Her most recent nuclear perfusion study is unchanged from 2 years previously.  She had recently developed an episode of recurrent atrial fibrillation with rapid ventricular response  which initially responded to increased beta blocker therapy as well as amiodarone dosing. She also developed recurrent AF resulting in increased Cardizem dosing and subsequent successful DC cardioversion which was done of her 14 2014. She again developed recurrent AF despite being on rate control medications as well as amiodarone. Since I last saw her whe successfully weaned and discontinued her amiodarone and further titrated her beta blocker therapy. Her ventricular rate is still now in the 80 and 90s particularly with auscultation. She is taking diltiazem 240 mg for rate control as well as Toprol XL 150 mg. She has a 100 mg pills. Presently, I recommend that on even days she change and take her Toprol XL at 100 mg twice a day and on odd days to take Toprol-XL 100 mg in the morning and 50 mg at night. This should provide additional rate control for atrial fibrillation. Her blood pressure is controlled. If she does require an excision for her skin lesion she may need to hold her Coumadin for 3-4 days but would restart this particularly since she is back in atrial fibrillation. I will see her back in the office in 3-4 months for followup cardiology evaluation or sooner if recurrent problems develop.  Troy Sine, MD, [facility]  08/31/2013 12:46 PM

## 2013-09-07 ENCOUNTER — Telehealth (HOSPITAL_COMMUNITY): Payer: Self-pay | Admitting: *Deleted

## 2013-09-07 NOTE — Telephone Encounter (Signed)
Phone busy.

## 2013-09-07 NOTE — Telephone Encounter (Signed)
I called pt to schedule LEA. She wants to know if its necessary being that she has been in Afib. Please advise.

## 2013-09-09 NOTE — Telephone Encounter (Signed)
I spoke with patient and explained that Afib won't have any effect on the LE doppler.  Patient verbalized understanding.I will send to [facility] to schedule test.

## 2013-09-14 ENCOUNTER — Ambulatory Visit (INDEPENDENT_AMBULATORY_CARE_PROVIDER_SITE_OTHER): Payer: Medicare Other | Admitting: Pharmacist Clinician (PhC)/ Clinical Pharmacy Specialist

## 2013-09-14 VITALS — BP 128/62 | HR 84

## 2013-09-14 DIAGNOSIS — I4891 Unspecified atrial fibrillation: Secondary | ICD-10-CM

## 2013-09-14 DIAGNOSIS — Z7901 Long term (current) use of anticoagulants: Secondary | ICD-10-CM

## 2013-09-14 LAB — POCT INR: INR: 2.7

## 2013-09-21 ENCOUNTER — Ambulatory Visit: Payer: Medicare Other | Admitting: Pharmacist Clinician (PhC)/ Clinical Pharmacy Specialist

## 2013-09-23 ENCOUNTER — Telehealth: Payer: Self-pay | Admitting: *Deleted

## 2013-09-23 NOTE — Telephone Encounter (Signed)
Dr. Claiborne Billings notified and reviewed chart.  Advised pt d/c hctz for now and other meds may be able to be adjusted once BP improves.  Returned call and pt verified x 2.  Pt informed Dr. Claiborne Billings was notified and advice given.  Pt verbalized understanding and agreed w/ plan.  Hctz taken off med list and pt will remove from pill box.  Pt understands she can call after hours or on the weekend and someone will call her back.

## 2013-09-23 NOTE — Telephone Encounter (Signed)
Pt is in A-fib and the medication is lowering her BP and she is dizzy with BP this morning being 98/59. She stated that her heart still races.  TK

## 2013-09-24 ENCOUNTER — Other Ambulatory Visit (HOSPITAL_COMMUNITY): Payer: Self-pay | Admitting: Cardiovascular Disease

## 2013-09-24 DIAGNOSIS — I739 Peripheral vascular disease, unspecified: Secondary | ICD-10-CM

## 2013-09-25 ENCOUNTER — Encounter (HOSPITAL_COMMUNITY): Payer: Self-pay | Admitting: *Deleted

## 2013-10-12 ENCOUNTER — Ambulatory Visit (INDEPENDENT_AMBULATORY_CARE_PROVIDER_SITE_OTHER): Payer: Medicare Other | Admitting: Pharmacist Clinician (PhC)/ Clinical Pharmacy Specialist

## 2013-10-12 ENCOUNTER — Ambulatory Visit (HOSPITAL_COMMUNITY)
Admission: RE | Admit: 2013-10-12 | Discharge: 2013-10-12 | Disposition: A | Discharge status: Home / Self Care | Payer: Medicare Other | Source: Ambulatory Visit | Attending: Cardiovascular Disease | Admitting: Cardiovascular Disease

## 2013-10-12 ENCOUNTER — Telehealth: Payer: Self-pay | Admitting: Pharmacist Clinician (PhC)/ Clinical Pharmacy Specialist

## 2013-10-12 DIAGNOSIS — I4891 Unspecified atrial fibrillation: Secondary | ICD-10-CM

## 2013-10-12 DIAGNOSIS — I739 Peripheral vascular disease, unspecified: Secondary | ICD-10-CM | POA: Insufficient documentation

## 2013-10-12 DIAGNOSIS — Z7901 Long term (current) use of anticoagulants: Secondary | ICD-10-CM

## 2013-10-12 LAB — POCT INR: INR: 2.8

## 2013-10-12 NOTE — Telephone Encounter (Signed)
After visit, pt noticed med she did not recognize on her med list.  Med transferred over from [facility].  We removed it from her profile

## 2013-10-12 NOTE — Progress Notes (Signed)
Lower Extremity Arterial Duplex Completed. °[name],RVT °

## 2013-10-20 ENCOUNTER — Encounter (HOSPITAL_COMMUNITY): Payer: Self-pay | Admitting: Emergency Medicine

## 2013-10-20 ENCOUNTER — Inpatient Hospital Stay (HOSPITAL_COMMUNITY)
Admission: EM | Admit: 2013-10-20 | Discharge: 2013-10-22 | DRG: 193 | Disposition: A | Discharge status: Home / Self Care | Payer: Medicare Other | Attending: Internal Medicine | Admitting: Internal Medicine

## 2013-10-20 ENCOUNTER — Telehealth: Payer: Self-pay | Admitting: Cardiovascular Disease

## 2013-10-20 ENCOUNTER — Emergency Department (HOSPITAL_COMMUNITY): Payer: Medicare Other

## 2013-10-20 DIAGNOSIS — Z8249 Family history of ischemic heart disease and other diseases of the circulatory system: Secondary | ICD-10-CM

## 2013-10-20 DIAGNOSIS — I4891 Unspecified atrial fibrillation: Secondary | ICD-10-CM | POA: Diagnosis present

## 2013-10-20 DIAGNOSIS — Z951 Presence of aortocoronary bypass graft: Secondary | ICD-10-CM

## 2013-10-20 DIAGNOSIS — I252 Old myocardial infarction: Secondary | ICD-10-CM

## 2013-10-20 DIAGNOSIS — I4819 Other persistent atrial fibrillation: Secondary | ICD-10-CM

## 2013-10-20 DIAGNOSIS — F411 Generalized anxiety disorder: Secondary | ICD-10-CM | POA: Diagnosis present

## 2013-10-20 DIAGNOSIS — E785 Hyperlipidemia, unspecified: Secondary | ICD-10-CM | POA: Diagnosis present

## 2013-10-20 DIAGNOSIS — R791 Abnormal coagulation profile: Secondary | ICD-10-CM

## 2013-10-20 DIAGNOSIS — J438 Other emphysema: Secondary | ICD-10-CM | POA: Diagnosis present

## 2013-10-20 DIAGNOSIS — R0902 Hypoxemia: Secondary | ICD-10-CM

## 2013-10-20 DIAGNOSIS — Z9089 Acquired absence of other organs: Secondary | ICD-10-CM

## 2013-10-20 DIAGNOSIS — I4892 Unspecified atrial flutter: Secondary | ICD-10-CM | POA: Diagnosis present

## 2013-10-20 DIAGNOSIS — J9601 Acute respiratory failure with hypoxia: Secondary | ICD-10-CM | POA: Diagnosis present

## 2013-10-20 DIAGNOSIS — Z888 Allergy status to other drugs, medicaments and biological substances status: Secondary | ICD-10-CM

## 2013-10-20 DIAGNOSIS — E119 Type 2 diabetes mellitus without complications: Secondary | ICD-10-CM | POA: Diagnosis present

## 2013-10-20 DIAGNOSIS — I70219 Atherosclerosis of native arteries of extremities with intermittent claudication, unspecified extremity: Secondary | ICD-10-CM | POA: Diagnosis present

## 2013-10-20 DIAGNOSIS — K219 Gastro-esophageal reflux disease without esophagitis: Secondary | ICD-10-CM | POA: Diagnosis present

## 2013-10-20 DIAGNOSIS — Z87891 Personal history of nicotine dependence: Secondary | ICD-10-CM

## 2013-10-20 DIAGNOSIS — J4489 Other specified chronic obstructive pulmonary disease: Secondary | ICD-10-CM | POA: Diagnosis present

## 2013-10-20 DIAGNOSIS — I2510 Atherosclerotic heart disease of native coronary artery without angina pectoris: Secondary | ICD-10-CM | POA: Diagnosis present

## 2013-10-20 DIAGNOSIS — J449 Chronic obstructive pulmonary disease, unspecified: Secondary | ICD-10-CM

## 2013-10-20 DIAGNOSIS — J189 Pneumonia, unspecified organism: Principal | ICD-10-CM | POA: Diagnosis present

## 2013-10-20 DIAGNOSIS — Z9861 Coronary angioplasty status: Secondary | ICD-10-CM

## 2013-10-20 DIAGNOSIS — I10 Essential (primary) hypertension: Secondary | ICD-10-CM

## 2013-10-20 DIAGNOSIS — Z803 Family history of malignant neoplasm of breast: Secondary | ICD-10-CM

## 2013-10-20 DIAGNOSIS — Z7901 Long term (current) use of anticoagulants: Secondary | ICD-10-CM

## 2013-10-20 DIAGNOSIS — J9600 Acute respiratory failure, unspecified whether with hypoxia or hypercapnia: Secondary | ICD-10-CM | POA: Insufficient documentation

## 2013-10-20 LAB — I-STAT ARTERIAL BLOOD GAS, ED
ACID-BASE EXCESS: 1 mmol/L (ref 0.0–2.0)
BICARBONATE: 25.5 meq/L — AB (ref 20.0–24.0)
O2 Saturation: 84 %
PCO2 ART: 38.7 mmHg (ref 35.0–45.0)
PO2 ART: 48 mmHg — AB (ref 80.0–100.0)
Patient temperature: 98.6
TCO2: 27 mmol/L (ref 0–100)
pH, Arterial: 7.427 (ref 7.350–7.450)

## 2013-10-20 LAB — BASIC METABOLIC PANEL
BUN: 15 mg/dL (ref 6–23)
CO2: 28 mEq/L (ref 19–32)
CREATININE: 0.97 mg/dL (ref 0.50–1.10)
Calcium: 9 mg/dL (ref 8.4–10.5)
Chloride: 95 mEq/L — ABNORMAL LOW (ref 96–112)
GFR, EST AFRICAN AMERICAN: 59 mL/min — AB (ref >90)
GFR, EST NON AFRICAN AMERICAN: 51 mL/min — AB (ref >90)
Glucose, Bld: 147 mg/dL — ABNORMAL HIGH (ref 70–99)
Potassium: 3.7 mEq/L (ref 3.7–5.3)
Sodium: 138 mEq/L (ref 137–147)

## 2013-10-20 LAB — PRO B NATRIURETIC PEPTIDE: Pro B Natriuretic peptide (BNP): 1789 pg/mL — ABNORMAL HIGH (ref 0–450)

## 2013-10-20 LAB — GLUCOSE, CAPILLARY: Glucose-Capillary: 209 mg/dL — ABNORMAL HIGH (ref 70–99)

## 2013-10-20 LAB — CBC
HCT: 43.1 % (ref 36.0–46.0)
Hemoglobin: 15.1 g/dL — ABNORMAL HIGH (ref 12.0–15.0)
MCH: 31.5 pg (ref 26.0–34.0)
MCHC: 35 g/dL (ref 30.0–36.0)
MCV: 90 fL (ref 78.0–100.0)
Platelets: 256 10*3/uL (ref 150–400)
RBC: 4.79 MIL/uL (ref 3.87–5.11)
RDW: 14 % (ref 11.5–15.5)
WBC: 14.6 10*3/uL — ABNORMAL HIGH (ref 4.0–10.5)

## 2013-10-20 LAB — MRSA PCR SCREENING: MRSA by PCR: NEGATIVE

## 2013-10-20 LAB — PROTIME-INR
INR: 3.8 — ABNORMAL HIGH (ref 0.00–1.49)
Prothrombin Time: 36 seconds — ABNORMAL HIGH (ref 11.6–15.2)

## 2013-10-20 LAB — I-STAT TROPONIN, ED: TROPONIN I, POC: 0.01 ng/mL (ref 0.00–0.08)

## 2013-10-20 LAB — MAGNESIUM: Magnesium: 1.6 mg/dL (ref 1.5–2.5)

## 2013-10-20 LAB — TROPONIN I

## 2013-10-20 LAB — STREP PNEUMONIAE URINARY ANTIGEN: Strep Pneumo Urinary Antigen: NEGATIVE

## 2013-10-20 MED ORDER — ALBUTEROL SULFATE (2.5 MG/3ML) 0.083% IN NEBU
2.5000 mg | INHALATION_SOLUTION | Freq: Four times a day (QID) | RESPIRATORY_TRACT | Status: DC
  Administered 2013-10-20 – 2013-10-21 (×2): 2.5 mg via RESPIRATORY_TRACT
  Filled 2013-10-20 (×2): qty 3

## 2013-10-20 MED ORDER — AZITHROMYCIN 500 MG PO TABS
500.0000 mg | ORAL_TABLET | ORAL | Status: DC
  Administered 2013-10-21 – 2013-10-22 (×2): 500 mg via ORAL
  Filled 2013-10-20 (×2): qty 1

## 2013-10-20 MED ORDER — SODIUM CHLORIDE 0.9 % IJ SOLN
3.0000 mL | Freq: Two times a day (BID) | INTRAMUSCULAR | Status: DC
  Administered 2013-10-20 – 2013-10-21 (×3): 3 mL via INTRAVENOUS

## 2013-10-20 MED ORDER — PANTOPRAZOLE SODIUM 40 MG PO TBEC
40.0000 mg | DELAYED_RELEASE_TABLET | Freq: Every day | ORAL | Status: DC
  Administered 2013-10-21 – 2013-10-22 (×2): 40 mg via ORAL
  Filled 2013-10-20 (×3): qty 1

## 2013-10-20 MED ORDER — RAMIPRIL 10 MG PO TABS: 10.0000 mg | ORAL_TABLET | Freq: Every day | ORAL | Status: DC

## 2013-10-20 MED ORDER — ISOSORBIDE MONONITRATE ER 30 MG PO TB24
30.0000 mg | ORAL_TABLET | Freq: Every day | ORAL | Status: DC
  Administered 2013-10-21 – 2013-10-22 (×2): 30 mg via ORAL
  Filled 2013-10-20 (×3): qty 1

## 2013-10-20 MED ORDER — ATORVASTATIN CALCIUM 80 MG PO TABS
80.0000 mg | ORAL_TABLET | Freq: Every evening | ORAL | Status: DC
  Administered 2013-10-20 – 2013-10-21 (×2): 80 mg via ORAL
  Filled 2013-10-20 (×3): qty 1

## 2013-10-20 MED ORDER — DEXTROSE 5 % IV SOLN: 1.0000 g | INTRAVENOUS | Status: DC

## 2013-10-20 MED ORDER — DEXTROSE 5 % IV SOLN: 500.0000 mg | INTRAVENOUS | Status: DC

## 2013-10-20 MED ORDER — WARFARIN - PHARMACIST DOSING INPATIENT: Freq: Every day | Status: DC

## 2013-10-20 MED ORDER — ONDANSETRON HCL 4 MG/2ML IJ SOLN: 4.0000 mg | Freq: Four times a day (QID) | INTRAMUSCULAR | Status: DC | PRN

## 2013-10-20 MED ORDER — RAMIPRIL 10 MG PO CAPS
10.0000 mg | ORAL_CAPSULE | Freq: Every day | ORAL | Status: DC
  Administered 2013-10-21 – 2013-10-22 (×2): 10 mg via ORAL
  Filled 2013-10-20 (×2): qty 1

## 2013-10-20 MED ORDER — IPRATROPIUM BROMIDE 0.02 % IN SOLN
0.5000 mg | Freq: Four times a day (QID) | RESPIRATORY_TRACT | Status: DC
  Administered 2013-10-20 – 2013-10-21 (×2): 0.5 mg via RESPIRATORY_TRACT
  Filled 2013-10-20 (×2): qty 2.5

## 2013-10-20 MED ORDER — CILOSTAZOL 100 MG PO TABS
100.0000 mg | ORAL_TABLET | Freq: Two times a day (BID) | ORAL | Status: DC
  Administered 2013-10-20 – 2013-10-22 (×4): 100 mg via ORAL
  Filled 2013-10-20 (×5): qty 1

## 2013-10-20 MED ORDER — METOPROLOL SUCCINATE ER 100 MG PO TB24
100.0000 mg | ORAL_TABLET | Freq: Two times a day (BID) | ORAL | Status: DC
  Administered 2013-10-21 – 2013-10-22 (×3): 100 mg via ORAL
  Filled 2013-10-20 (×5): qty 1

## 2013-10-20 MED ORDER — DEXTROSE 5 % IV SOLN
500.0000 mg | Freq: Once | INTRAVENOUS | Status: AC
  Administered 2013-10-20: 500 mg via INTRAVENOUS

## 2013-10-20 MED ORDER — FLUTICASONE PROPIONATE 50 MCG/ACT NA SUSP
2.0000 | Freq: Every day | NASAL | Status: DC | PRN
  Filled 2013-10-20: qty 16

## 2013-10-20 MED ORDER — DEXTROSE 5 % IV SOLN
1.0000 g | INTRAVENOUS | Status: DC
  Administered 2013-10-21 – 2013-10-22 (×2): 1 g via INTRAVENOUS
  Filled 2013-10-20 (×2): qty 10

## 2013-10-20 MED ORDER — ALPRAZOLAM 0.25 MG PO TABS
0.2500 mg | ORAL_TABLET | Freq: Once | ORAL | Status: AC
  Administered 2013-10-20: 0.25 mg via ORAL
  Filled 2013-10-20: qty 1

## 2013-10-20 MED ORDER — GUAIFENESIN 100 MG/5ML PO SOLN
5.0000 mL | ORAL | Status: DC | PRN
  Administered 2013-10-21 – 2013-10-22 (×2): 100 mg via ORAL
  Filled 2013-10-20 (×2): qty 5

## 2013-10-20 MED ORDER — DILTIAZEM HCL ER COATED BEADS 240 MG PO CP24
240.0000 mg | ORAL_CAPSULE | Freq: Every day | ORAL | Status: DC
  Administered 2013-10-21 – 2013-10-22 (×2): 240 mg via ORAL
  Filled 2013-10-20 (×3): qty 1

## 2013-10-20 MED ORDER — DEXTROSE 5 % IV SOLN
1.0000 g | Freq: Once | INTRAVENOUS | Status: AC
  Administered 2013-10-20: 1 g via INTRAVENOUS
  Filled 2013-10-20: qty 10

## 2013-10-20 MED ORDER — ONDANSETRON HCL 4 MG PO TABS: 4.0000 mg | ORAL_TABLET | Freq: Four times a day (QID) | ORAL | Status: DC | PRN

## 2013-10-20 NOTE — ED Provider Notes (Signed)
CSN: [id number]     Arrival date & time 10/20/13  1221 History   First MD Initiated Contact with Patient 10/20/13 1244     Chief Complaint  Patient presents with  . Irregular Heart Beat     (Consider location/radiation/quality/duration/timing/severity/associated sxs/prior Treatment) The history is provided by the patient. No language interpreter was used.    Admit and this is an 80 year old female with a past medical history of MI, COPD, long standing atrial fibrillation, known peripheral vascular disease, status post quadruple bypass surgery.  The patient presents today with her A. fib with RVR.  She is history of atrial fibrillation but states that for the past 2 days her heart rate has gone up to 150 several times.  She was concerned about this and has had to have cardioversion as recently as last November 2014.  The patient called her cardiologist today and at the after to come to be evaluated the emergency department.  The patient has a recent upper respiratory infection for the past 2 weeks.  She has had a productive cough.  She called her PCP was started on prednisone but has not been taking any antibiotics.  Patient states that her oxygen saturations are normally around 90%, however her oxygen saturations were seen to be about 85% in triage.  Past Medical History  Diagnosis Date  . Atrial fibrillation   . Coronary artery disease   . COPD (chronic obstructive pulmonary disease)   . Hypertension 11/04/2009    renal doppler showed abd aorta normal size  low velocities  w/ moderate amt plaque; rgt & lft kidney  normal size,renal arteries normal patency  . Anxiety   . Pneumonia     5-6 years ago  . Arthritis   . Myocardial infarction     Mild MI 1987,inferior wall  . Paroxysmal atrial fibrillation     last hospitalized July 2013  . Hyperlipidemia   . PVD (peripheral vascular disease) 09/18/2011    carotid doppler showed mild amount of fibrous plaque both carotid arteries w/o  significant flow reduction or stenosis  . PVD (peripheral vascular disease) 09/18/2011    Lower extremity doppler showed ABIof 0.97 on the right,0.83 on the left.,50-60%narrowing rgt common iliac greater than 70% right SFA, right posterior tibial & peroneal arteries occlude  . PVD (peripheral vascular disease) 09/14/2010    ABI's in 2012 were 0.94 and 0.81 but velocities appeared more sigificantly changed  . PVD (peripheral vascular disease) with claudication 09/15/2007    abd aotra moder. amt plaque w/low velocities, left SFA occluded vessel, rgt prox. & mid SFA elvated  velocities ABI rgt O.87,ABI  lft 0.77   . Peripheral angiopathy 03/21/2004    total left SFA w/three vessel runoff though tibial vessels are diffusely disease tx medically,left 70% distal lft external iliac,70% rgt common iliac, 50-60% prox and mid rgt SFA ,  . Diabetes mellitus without complication    Past Surgical History  Procedure Laterality Date  . Nasal sinus surgery    . Quadruple  bypass    . Cholecystectomy    . Eye surgery      bilateral catracts  . Coronary angioplasty  1987    emergent,60% LAD  . Coronary artery bypass graft  03/23/1998    CABGx4 left internal mammary artery to LAD, SVG to diagonal branch to LAD, first obtuse branch Left Circ,and posterior descending branch of RCA  . Doppler echocardiography  05/29/2012    proximal septal thickening w/normal systolic and probable grade 2  diastolic dysfunction,mod LA dilatation, mild  annilar calcifcation w/mild to mod MR, mild to mod.TR , mild pulm. HTN w/upper normal pulm. pressures. mild aortic sclerosis w/o stenosis and pulm.valvular regrurgitation  . Doppler echocardiography  04/16/2011    EF =>55%, LV normal,Grade II diastolic dysfunction, aortic valve moderately sclerotic  . Doppler echocardiography  06/16/2009    EF => 55%,LV normal ,mild to moder. mitral annular calcifcation,no aortic stenosis, aortic valve appears moder.sclerotic  . Cardiac stress  test  11/09/2010    EF 75%, small area of reversible lateral ischemia. SDS 2 . Extent 3%  . Cardiac stress test  11/04/2009    EF 75%, LV normal ,   . Cardiac catheterization  03/22/1998    severe four vessel coronary disease normal LV function  . Atrial flutter ablation  11/11/1996    Dr Caryl Comes did RFablation atrial flutter  and sinus node re-entry tachycardia  . Cardioversion N/A 06/19/2013    Procedure: CARDIOVERSION;  Surgeon: Troy Sine, MD;  Location: [facility] ENDOSCOPY;  Service: Cardiovascular;  Laterality: N/A;   Family History  Problem Relation Age of Onset  . Heart disease Father   . Breast cancer Paternal Aunt    History  Substance Use Topics  . Smoking status: Former Smoker    Types: Cigarettes  . Smokeless tobacco: Never Used     Comment: quit smoking 14 years ago.  . Alcohol Use: No   OB History   Grav Para Term Preterm Abortions TAB SAB Ect Mult Living   3 3        3      Review of Systems  Constitutional: Positive for fatigue. Negative for fever and chills.  HENT: Positive for congestion.   Respiratory: Positive for cough. Negative for shortness of breath and wheezing.   Cardiovascular: Positive for palpitations. Negative for chest pain.  Gastrointestinal: Negative for nausea, vomiting and abdominal pain.  Genitourinary: Negative for dysuria.  Musculoskeletal: Negative for myalgias.  Skin: Negative for rash.      Allergies  Elavil; Nifedipine; and Tegretol  Home Medications   Current Outpatient Rx  Name  Route  Sig  Dispense  Refill  . acetaminophen (TYLENOL) 500 MG tablet   Oral   Take 1,000 mg by mouth every 6 (six) hours as needed. For pain         . ALPRAZolam (XANAX) 0.25 MG tablet   Oral   Take 0.25 mg by mouth 2 (two) times daily as needed for anxiety. For anxiety         . atorvastatin (LIPITOR) 80 MG tablet   Oral   Take 1 tablet (80 mg total) by mouth every evening.   90 tablet   3   . calcium carbonate (OS-CAL) 600 MG TABS    Oral   Take 600 mg by mouth 2 (two) times daily with a meal.         . cilostazol (PLETAL) 100 MG tablet   Oral   Take 1 tablet (100 mg total) by mouth 2 (two) times daily.   180 tablet   3   . dicyclomine (BENTYL) 10 MG capsule   Oral   Take 10 mg by mouth as needed.         . diltiazem (CARDIZEM CD) 120 MG 24 hr capsule   Oral   Take 240 mg by mouth daily.          Marland Kitchen esomeprazole (NEXIUM) 40 MG capsule   Oral   Take 1 capsule (  40 mg total) by mouth daily at 12 noon.   90 capsule   3   . ezetimibe (ZETIA) 10 MG tablet   Oral   Take 10 mg by mouth every evening.          . fluticasone (FLONASE) 50 MCG/ACT nasal spray   Nasal   Place 2 sprays into the nose daily.         . furosemide (LASIX) 20 MG tablet   Oral   Take 20 mg by mouth daily.          Marland Kitchen HYDROcodone-acetaminophen (NORCO/VICODIN) 5-325 MG per tablet   Oral   Take 1 tablet by mouth every 6 (six) hours as needed. For pain         . isosorbide mononitrate (IMDUR) 30 MG 24 hr tablet   Oral   Take 30 mg by mouth daily.         . Levocetirizine Dihydrochloride (XYZAL PO)   Oral   Take 10 mg by mouth every evening.         . metoprolol succinate (TOPROL-XL) 100 MG 24 hr tablet      On even days take 100 mg twice daily. On odd days take 1 tablet in the AM and 1/2 tablet in the PM.   135 tablet   3   . Omega-3 Fatty Acids (FISH OIL) 1200 MG CAPS   Oral   Take 1 capsule by mouth 2 (two) times daily.         . potassium chloride SA (K-DUR,KLOR-CON) 20 MEQ tablet   Oral   Take 40 mEq by mouth daily.          . psyllium (METAMUCIL) 58.6 % powder   Oral   Take 1 packet by mouth daily. At lunch or supper         . ramipril (ALTACE) 10 MG tablet   Oral   Take 10 mg by mouth daily.         . Tiotropium Bromide Monohydrate (SPIRIVA HANDIHALER IN)   Inhalation   Inhale 1 capsule into the lungs daily as needed.          . warfarin (COUMADIN) 5 MG tablet   Oral   Take 2.5-5  mg by mouth every evening. Take 5mg  on Wednesday and Friday. Take 2.5mg  the rest of the week.          BP 115/72  Pulse 116  Temp(Src) 97.7 F (36.5 C) (Oral)  Resp 22  Wt 144 lb (65.318 kg)  SpO2 88% Physical Exam  Constitutional: She is oriented to person, place, and time. She appears well-developed and well-nourished. No distress.  HENT:  Head: Normocephalic and atraumatic.  Eyes: Conjunctivae are normal. No scleral icterus.  Neck: Normal range of motion.  Cardiovascular: Normal rate, regular rhythm and normal heart sounds.  Exam reveals no gallop and no friction rub.   No murmur heard. Pulmonary/Chest: Effort normal. No respiratory distress. She has wheezes. She has no rales.  Diffuse inspiratory ronchi throughout all lung fields. Wheezing in the Left Lower lobe.   Abdominal: Soft. Bowel sounds are normal. She exhibits no distension and no mass. There is no tenderness. There is no guarding.  Musculoskeletal: Normal range of motion.  Neurological: She is alert and oriented to person, place, and time.  Skin: Skin is warm and dry. She is not diaphoretic.    ED Course  Procedures (including critical care time) Labs Review Labs Reviewed  BASIC METABOLIC PANEL -  Abnormal; Notable for the following:    Chloride 95 (*)    Glucose, Bld 147 (*)    GFR calc non Af Amer 51 (*)    GFR calc Af Amer 59 (*)    All other components within normal limits  CBC - Abnormal; Notable for the following:    WBC 14.6 (*)    Hemoglobin 15.1 (*)    All other components within normal limits  PRO B NATRIURETIC PEPTIDE - Abnormal; Notable for the following:    Pro B Natriuretic peptide (BNP) 1789.0 (*)    All other components within normal limits  PROTIME-INR - Abnormal; Notable for the following:    Prothrombin Time 36.0 (*)    INR 3.80 (*)    All other components within normal limits  MAGNESIUM  I-STAT TROPOININ, ED   Imaging Review No results found.   EKG Interpretation   Date/Time:   Tuesday October 20 2013 12:29:42 EDT Ventricular Rate:  116 PR Interval:    QRS Duration: 78 QT Interval:  348 QTC Calculation: 483 R Axis:   -81 Text Interpretation:  Atrial fibrillation with rapid ventricular response  with premature ventricular or aberrantly conducted complexes Left axis  deviation Anterior infarct , age undetermined Abnormal ECG Since last  tracing 11/14,  Normal sinus rhythm replaced with afib Confirmed by Sabra Heck   MD, BRIAN ([id number]) on 10/20/2013 12:35:35 PM      MDM   Final diagnoses:  CAP (community acquired pneumonia)  Atrial fibrillation with RVR  Hypoxia  Supratherapeutic INR   Filed Vitals:   10/20/13 1236 10/20/13 1238 10/20/13 1330 10/20/13 1356  BP: 115/72  114/77 119/71  Pulse: 116  87 108  Temp: 97.7 F (36.5 C)     TempSrc: Oral     Resp: 22  22   Weight: 144 lb (65.318 kg)     SpO2: 84% 88% 90% 89%    80 y/o fever, tachycardia, Afib. Leukocytosis, elevated bnp, however I feel clinically the patient has CAP. Desaturated to 84 % when taken off 02.  The patient will be admitted for Hypoxia, afib with rvr and CAP by internal med. Teaching service. Patient agrees with POC.   Margarita Mail, PA-C 10/20/13 2134

## 2013-10-20 NOTE — Progress Notes (Signed)
ANTICOAGULATION CONSULT NOTE - Initial Consult  Pharmacy Consult for warfarin Indication: atrial fibrillation  Allergies  Allergen Reactions  . Elavil [Amitriptyline] Other (See Comments)    Felt drunk  . Nifedipine Other (See Comments)    Heart races  . Tegretol [Carbamazepine] Other (See Comments)    Felt drunk    Patient Measurements: Weight: 144 lb (65.318 kg)  Vital Signs: Temp: 97.7 F (36.5 C) (03/17 1236) Temp src: Oral (03/17 1236) BP: 112/74 mmHg (03/17 1700) Pulse Rate: 91 (03/17 1700)  Labs:  Recent Labs  10/20/13 1240  HGB 15.1*  HCT 43.1  PLT 256  LABPROT 36.0*  INR 3.80*  CREATININE 0.97    The CrCl is unknown because both a height and weight (above a minimum accepted value) are required for this calculation.   Medical History: Past Medical History  Diagnosis Date  . Atrial fibrillation   . Coronary artery disease   . COPD (chronic obstructive pulmonary disease)   . Hypertension 11/04/2009    renal doppler showed abd aorta normal size  low velocities  w/ moderate amt plaque; rgt & lft kidney  normal size,renal arteries normal patency  . Anxiety   . Pneumonia     5-6 years ago  . Arthritis   . Myocardial infarction     Mild MI 1987,inferior wall  . Paroxysmal atrial fibrillation     last hospitalized July 2013  . Hyperlipidemia   . PVD (peripheral vascular disease) 09/18/2011    carotid doppler showed mild amount of fibrous plaque both carotid arteries w/o significant flow reduction or stenosis  . PVD (peripheral vascular disease) 09/18/2011    Lower extremity doppler showed ABIof 0.97 on the right,0.83 on the left.,50-60%narrowing rgt common iliac greater than 70% right SFA, right posterior tibial & peroneal arteries occlude  . PVD (peripheral vascular disease) 09/14/2010    ABI's in 2012 were 0.94 and 0.81 but velocities appeared more sigificantly changed  . PVD (peripheral vascular disease) with claudication 09/15/2007    abd aotra  moder. amt plaque w/low velocities, left SFA occluded vessel, rgt prox. & mid SFA elvated  velocities ABI rgt O.87,ABI  lft 0.77   . Peripheral angiopathy 03/21/2004    total left SFA w/three vessel runoff though tibial vessels are diffusely disease tx medically,left 70% distal lft external iliac,70% rgt common iliac, 50-60% prox and mid rgt SFA ,  . Diabetes mellitus without complication     Medications:  Infusions:  . azithromycin 500 mg (10/20/13 1700)  . [START ON 10/21/2013] azithromycin    . [START ON 10/21/2013] cefTRIAXone (ROCEPHIN)  IV      Assessment: 80 yo F with PAF presents to ED with c/o elevated HR. Pharmacy has been consulted to continue PTA warfarin.  I spoke with patient, and she currently takes  2.5mg  daily except 5mg  on Sunday.  Her usual time of dose is 1500, so she has not received a dose yet today.  Baseline INR shows a supratherapeutic INR of 3.8.  H/H is 15.1/43.1 and plt are wnl.  No issues of bleeding noted.  Drug-Drug Interactions: Patient is being started on ceftriaxone and azithromycin, which can increase the effects of warfarin. Will monitor.  Goal of Therapy:  INR 2-3 Monitor platelets by anticoagulation protocol: Yes   Plan:  - hold warfarin tonight - daily INR - monitor for s/s of bleeding  Ovid Curd E. Jacqlyn Larsen, PharmD Clinical Pharmacist - Resident Pager: [phone] Pharmacy: [phone] 10/20/2013 5:41 PM

## 2013-10-20 NOTE — ED Notes (Signed)
Pt removed from O2 for ABG

## 2013-10-20 NOTE — ED Notes (Signed)
Pt states that HR went up to the 140s and states it usually states in the 120s.  Pt was referred to the ED.  Pt sats 85% at triage, history of atrial fib, COPD.  Pt does not use home 02.  Pt denies feeling sob

## 2013-10-20 NOTE — Telephone Encounter (Signed)
Please call,she is in atrial fib.

## 2013-10-20 NOTE — ED Notes (Signed)
EKG completed in triage.

## 2013-10-20 NOTE — Telephone Encounter (Signed)
Returned call and pt verified x 2.  Pt stated she has atrial fibrillation and Dr. Claiborne Billings increased her medication at her last visit.  Stated when she was in ER they told her if HR was <120 it was more harm to treat.  This morning it was in 140s for a few seconds and then gradually goes down.  Pt wants to know if she needs to go to the hospital or not.  HR 100-140s.  Denied CP and c/o some SOB.  Stated she has a chest cold also.  Pt stated she didn't want to go to ER if they weren't going to do anything.  Dr. Gwenlyn Found (DOD) notified and advised pt go to ER for evaluation since she is symptomatic.  Pt informed per MD and verbalized understanding.  Pt agreed w/ plan and will get transportation to ER or call 911 to [facility].

## 2013-10-20 NOTE — H&P (Signed)
Date: 10/20/2013               Patient Name:  Cheyenne [name] MRN: [id number]  DOB: 06-22-1925 Age / Sex: 80 y.o., female   PCP: Hayden Rasmussen, MD         Medical Service: Internal Medicine Teaching Service         Attending Physician: Dr. Dominic Pea, DO    First Contact: Dr. Truddie Hidden (MS4) Pager: [phone]  Second Contact: Dr. Randell Loop Pager: [phone]       After Hours (After 5p/  First Contact Pager: [phone]  weekends / holidays): Second Contact Pager: [phone]   Chief Complaint: Increased heart rate  History of Present Illness:  Cheyenne [name] is a 80 y.o. female with PMH paroxysmal atrial fibrillation with RVR on Coumadin, CAD (s/p inferior MI with PTCA of RCA and CABG in 1997, normal follow-up NST with EF 73% on 01/2013), HTN, HLD, GERD who presents to the ED with a chief complaint of tachycardia. Her health literacy is excellent and she is a very good historian.  Cheyenne [name] has a history of AF with RVR followed by Dr. Shelva Majestic with Cardiology. She checks her heart rate regularly at home. Yesterday and today, it went >140 a couple times, for a few seconds at a time, so she called her doctor who told her to come to the ED.  For the past week, she has also had a cough productive of yellow sputum and associated chills. She denies chest pain, shortness of breath, abdominal pain, nausea, vomiting, diarrhea, leg swelling. She has been compliant with all of her medications including Coumadin.  She was hospitalized here at [facility] in 10/14 for AF with RVR. At that time her amiodarone dose was adjusted and she underwent successful cardioversion with restoration of sinus rhythm on 06/19/2013, but ultimately converted back 8 days later at her next office visit. She was weaned off her amiodarone in December 2014 because her cardiologist felt it wasn't necessary and was concerned about the side effect profile. She is currently taking diltiazem 240 mg for rate control as well as Toprol XL  - on even days she takes 100 mg twice a day and on odd days she takes 100 mg in the morning and 50 mg at night. This regimen was working well with no tachycardia until the current episodes.  She moved here from [place] in the 49's at age 61, as a "war bride". She has children, and her middle child Lynthea (sp?) is her HCPOA.  In the ED, she was started on IV azithromycin and ceftriaxone and put on supplemental oxygen.   Meds:  Current Outpatient Prescriptions  Medication Sig Dispense Refill  . acetaminophen (TYLENOL) 500 MG tablet Take 1,000 mg by mouth every 6 (six) hours as needed. For pain      . ALPRAZolam (XANAX) 0.25 MG tablet Take 0.5 mg by mouth at bedtime. For anxiety      . atorvastatin (LIPITOR) 80 MG tablet Take 1 tablet (80 mg total) by mouth every evening.  90 tablet  3  . calcium carbonate (OS-CAL) 600 MG TABS Take 600 mg by mouth 2 (two) times daily with a meal.      . cilostazol (PLETAL) 100 MG tablet Take 1 tablet (100 mg total) by mouth 2 (two) times daily.  180 tablet  3  . dicyclomine (BENTYL) 10 MG capsule Take 10 mg by mouth daily as needed for spasms.       Marland Kitchen  diltiazem (CARDIZEM CD) 120 MG 24 hr capsule Take 240 mg by mouth daily.       Marland Kitchen esomeprazole (NEXIUM) 40 MG capsule Take 40 mg by mouth daily before breakfast.      . ezetimibe (ZETIA) 10 MG tablet Take 10 mg by mouth every evening.       . fluticasone (FLONASE) 50 MCG/ACT nasal spray Place 2 sprays into the nose daily as needed for allergies.       . furosemide (LASIX) 20 MG tablet Take 20 mg by mouth daily.       Marland Kitchen HYDROcodone-acetaminophen (NORCO/VICODIN) 5-325 MG per tablet Take 1 tablet by mouth every 6 (six) hours as needed. For pain      . isosorbide mononitrate (IMDUR) 30 MG 24 hr tablet Take 30 mg by mouth daily before breakfast.       . Levocetirizine Dihydrochloride (XYZAL PO) Take 10 mg by mouth every evening.      . metoprolol succinate (TOPROL-XL) 100 MG 24 hr tablet On even days take 100 mg  twice daily. On odd days take 1 tablet in the AM and 1/2 tablet in the PM.  135 tablet  3  . Omega-3 Fatty Acids (FISH OIL) 1200 MG CAPS Take 1 capsule by mouth 2 (two) times daily.      . potassium chloride SA (K-DUR,KLOR-CON) 20 MEQ tablet Take 20 mEq by mouth 2 (two) times daily.       . psyllium (METAMUCIL) 58.6 % powder Take 1 packet by mouth daily. At lunch or supper      . ramipril (ALTACE) 10 MG tablet Take 10 mg by mouth daily.      . Tiotropium Bromide Monohydrate (SPIRIVA HANDIHALER IN) Inhale 1 capsule into the lungs daily.       Marland Kitchen warfarin (COUMADIN) 5 MG tablet Take 2.5-5 mg by mouth daily at 3 pm. Take one tablet (5mg ) on Sundays. All other days take one half-tablet (2.5mg ).        Allergies: Allergies as of 10/20/2013 - Review Complete 10/20/2013  Allergen Reaction Noted  . Elavil [amitriptyline] Other (See Comments) 02/24/2012  . Nifedipine Other (See Comments)   . Tegretol [carbamazepine] Other (See Comments) 02/24/2012   Past Medical History  Diagnosis Date  . Atrial fibrillation   . Coronary artery disease   . COPD (chronic obstructive pulmonary disease)   . Hypertension 11/04/2009    renal doppler showed abd aorta normal size  low velocities  w/ moderate amt plaque; rgt & lft kidney  normal size,renal arteries normal patency  . Anxiety   . Pneumonia     5-6 years ago  . Arthritis   . Myocardial infarction     Mild MI 1987,inferior wall  . Paroxysmal atrial fibrillation     last hospitalized July 2013  . Hyperlipidemia   . PVD (peripheral vascular disease) 09/18/2011    carotid doppler showed mild amount of fibrous plaque both carotid arteries w/o significant flow reduction or stenosis  . PVD (peripheral vascular disease) 09/18/2011    Lower extremity doppler showed ABIof 0.97 on the right,0.83 on the left.,50-60%narrowing rgt common iliac greater than 70% right SFA, right posterior tibial & peroneal arteries occlude  . PVD (peripheral vascular disease)  09/14/2010    ABI's in 2012 were 0.94 and 0.81 but velocities appeared more sigificantly changed  . PVD (peripheral vascular disease) with claudication 09/15/2007    abd aotra moder. amt plaque w/low velocities, left SFA occluded vessel, rgt prox. & mid  SFA elvated  velocities ABI rgt O.87,ABI  lft 0.77   . Peripheral angiopathy 03/21/2004    total left SFA w/three vessel runoff though tibial vessels are diffusely disease tx medically,left 70% distal lft external iliac,70% rgt common iliac, 50-60% prox and mid rgt SFA ,  . Diabetes mellitus without complication    Past Surgical History  Procedure Laterality Date  . Nasal sinus surgery    . Quadruple  bypass    . Cholecystectomy    . Eye surgery      bilateral catracts  . Coronary angioplasty  1987    emergent,60% LAD  . Coronary artery bypass graft  03/23/1998    CABGx4 left internal mammary artery to LAD, SVG to diagonal branch to LAD, first obtuse branch Left Circ,and posterior descending branch of RCA  . Doppler echocardiography  05/29/2012    proximal septal thickening w/normal systolic and probable grade 2 diastolic dysfunction,mod LA dilatation, mild  annilar calcifcation w/mild to mod MR, mild to mod.TR , mild pulm. HTN w/upper normal pulm. pressures. mild aortic sclerosis w/o stenosis and pulm.valvular regrurgitation  . Doppler echocardiography  04/16/2011    EF =>55%, LV normal,Grade II diastolic dysfunction, aortic valve moderately sclerotic  . Doppler echocardiography  06/16/2009    EF => 55%,LV normal ,mild to moder. mitral annular calcifcation,no aortic stenosis, aortic valve appears moder.sclerotic  . Cardiac stress test  11/09/2010    EF 75%, small area of reversible lateral ischemia. SDS 2 . Extent 3%  . Cardiac stress test  11/04/2009    EF 75%, LV normal ,   . Cardiac catheterization  03/22/1998    severe four vessel coronary disease normal LV function  . Atrial flutter ablation  11/11/1996    Dr Caryl Comes did RFablation  atrial flutter  and sinus node re-entry tachycardia  . Cardioversion N/A 06/19/2013    Procedure: CARDIOVERSION;  Surgeon: Troy Sine, MD;  Location: [facility] ENDOSCOPY;  Service: Cardiovascular;  Laterality: N/A;   Family History  Problem Relation Age of Onset  . Heart disease Father   . Breast cancer Paternal Aunt    History   Social History  . Marital Status: Widowed    Spouse Name: N/A    Number of Children: N/A  . Years of Education: N/A   Occupational History  . Not on file.   Social History Main Topics  . Smoking status: Former Smoker    Types: Cigarettes  . Smokeless tobacco: Never Used     Comment: quit smoking 14 years ago.  . Alcohol Use: No  . Drug Use: No  . Sexual Activity: Not on file   Other Topics Concern  . Not on file   Social History Narrative  . No narrative on file    Review of Systems: Pertinent items are noted in HPI.  Physical Exam: Blood pressure 112/74, pulse 91, temperature 97.7 F (36.5 C), temperature source Oral, resp. rate 26, weight 144 lb (65.318 kg), SpO2 88.00%. Physical Exam  Constitutional: She is oriented to person, place, and time and well-developed, well-nourished, and in no distress.  Very pleasant, conversant  HENT:  Head: Normocephalic and atraumatic.  Eyes: Conjunctivae and EOM are normal. Pupils are equal, round, and reactive to light.  Neck: Normal range of motion. Neck supple.  Cardiovascular: Normal rate.  An irregularly irregular rhythm present. Exam reveals no gallop and no friction rub.   No murmur heard. Rate 80-90s on telemetry in the room, atrial fibrillation  Pulmonary/Chest: Effort normal. No  respiratory distress. She has no wheezes. She has rales (Crackles heard at the bases bilaterally). She exhibits no tenderness.  Satting 82-83% on room air (when ABG drawn), 99-95% on 5L [place] at rest, 86% on 5L [place] when talking but asymptomatic  Abdominal: Soft. Bowel sounds are normal. She exhibits no distension. There is  no tenderness.  Musculoskeletal: Normal range of motion. She exhibits no edema and no tenderness.  Neurological: She is alert and oriented to person, place, and time. No cranial nerve deficit. GCS score is 15.  Skin: Skin is warm and dry.  Psychiatric: Mood, memory, affect and judgment normal.     Lab results: Basic Metabolic Panel:  Recent Labs  10/20/13 1240  NA 138  K 3.7  CL 95*  CO2 28  GLUCOSE 147*  BUN 15  CREATININE 0.97  CALCIUM 9.0  MG 1.6   Liver Function Tests: No results found for this basename: AST, ALT, ALKPHOS, BILITOT, PROT, ALBUMIN,  in the last 72 hours No results found for this basename: LIPASE, AMYLASE,  in the last 72 hours No results found for this basename: AMMONIA,  in the last 72 hours CBC:  Recent Labs  10/20/13 1240  WBC 14.6*  HGB 15.1*  HCT 43.1  MCV 90.0  PLT 256   Cardiac Enzymes: No results found for this basename: CKTOTAL, CKMB, CKMBINDEX, TROPONINI,  in the last 72 hours BNP:  Recent Labs  10/20/13 1240  PROBNP 1789.0*   D-Dimer: No results found for this basename: DDIMER,  in the last 72 hours CBG: No results found for this basename: GLUCAP,  in the last 72 hours Hemoglobin A1C: No results found for this basename: HGBA1C,  in the last 72 hours Fasting Lipid Panel: No results found for this basename: CHOL, HDL, LDLCALC, TRIG, CHOLHDL, LDLDIRECT,  in the last 72 hours Thyroid Function Tests: No results found for this basename: TSH, T4TOTAL, FREET4, T3FREE, THYROIDAB,  in the last 72 hours Anemia Panel: No results found for this basename: VITAMINB12, FOLATE, FERRITIN, TIBC, IRON, RETICCTPCT,  in the last 72 hours Coagulation:  Recent Labs  10/20/13 1240  LABPROT 36.0*  INR 3.80*   Urine Drug Screen: Drugs of Abuse  No results found for this basename: labopia,  cocainscrnur,  labbenz,  amphetmu,  thcu,  labbarb    Alcohol Level: No results found for this basename: ETH,  in the last 72 hours Urinalysis: No  results found for this basename: COLORURINE, APPERANCEUR, LABSPEC, PHURINE, GLUCOSEU, HGBUR, BILIRUBINUR, KETONESUR, PROTEINUR, UROBILINOGEN, NITRITE, LEUKOCYTESUR,  in the last 72 hours   Imaging results:  Dg Chest 2 View  10/20/2013   CLINICAL DATA:  [place]: CHEST  2 VIEW  COMPARISON:  DG CHEST 1V PORT dated 05/12/2013  FINDINGS: There is flattening of the hemidiaphragms and increased AP diameter of the chest. Patient is status post mediastinum and coronary artery bypass grafting. Cardiac silhouette is enlarged. Atherosclerotic calcifications identified within the aorta. There is prominence of the interstitial markings and areas of mild peribronchial cuffing. Areas increased density projects within the right left lung bases. The osseous structures demonstrate no evidence of acute abnormalities.  IMPRESSION: COPD. Differential consideration of interstitial findings are component of pulmonary edema as well as a underlying component of pulmonary fibrosis. An infectious or inflammatory infiltrate within the lung bases cannot be excluded clinically appropriate. Surveillance evaluation recommended.   Electronically Signed   By: Margaree Mackintosh M.D.   On: 10/20/2013 13:53    Other results:  EKG: Atrial fibrillation, rate 116, 1 PVC. Waveforms unchanged from previous EKG (09/01/13) but rate 87 on that study.  Assessment & Plan by Problem: Cheyenne [name] is a 80 y.o. female with PMH paroxysmal atrial fibrillation with RVR on Coumadin, CAD (s/p inferior MI with PTCA of RCA and CABG in 1997, normal follow-up NST with EF 73% on 01/2013), HTN, HLD, GERD who presents to the ED with a chief complaint of tachycardia, with productive cough and new oxygen requirement.  #Hypoxia, likely 2/2 CAP - Patient presents with a weeklong history of cough productive of yellow sputum and chills. CXR shows infiltrate at the lung bases. Leukocytosis to 14.6. She also has hypoxia, saturating 82-83% on room air. ABG  below shows PaO2 48.0, PaCO2 38.7, this gives her an A-a gradient of ~50, expected is ~25. This elevation is consistent with V/Q mismatch (such as from pneumonia), shunt (such as from PE, but she is on Coumadin with supra therapeutic INR), or alveolar hypoventilation (such as from ILD, see below). Also on the differential is CHF with pulmonary edema given AF with RVR (see below), elevated pro-BNP (1789). Clinically she does not appear volume overloaded, so I favor a diagnosis of pneumonia at this time given her history. - Admit to IMTS, stepdown - Continue ceftriaxone and azithromycin IV - Sputum culture - Blood cultures x2 (drawn after antibiotics) - Legionella and strep pneumonia urinary antigens - Oxygen therapy prn to keep O2 saturation greater then 90% - Continuous pulse oximetry - Zofran when necessary for nausea - Flu panel - PT eval and treat - BMP, CBC, PT/INR in am  ABG    Component Value Date/Time   PHART 7.427 10/20/2013 1601   PCO2ART 38.7 10/20/2013 1601   PO2ART 48.0* 10/20/2013 1601   HCO3 25.5* 10/20/2013 1601   TCO2 27 10/20/2013 1601   O2SAT 84.0 10/20/2013 1601    #Atrial fibrillation with episodes of RVR - Patient has a known history of paroxysmal A. fib with episodes of RVR. She is rate controlled only. She was weaned off her amiodarone in December 2014. Heart rate was 80s to 90s on our exam. She has had no episodes of hypotension. Likely trigger is community acquired pneumonia, as above. We will treat her for this and monitor her heart rate on telemetry. - Continue home diltiazem 240 mg daily - Continue home Toprol-XL 100 mg twice daily (at home she takes 50mg  for pm dose on odd days) - Trending troponins, point of care was negative - Repeat EKG in a.m. - TSH - Telemetry - Will touch base with primary cardiologist tomorrow, Dr. Claiborne Billings  #Supratherapeutic INR on Coumadin - 3.8 on admission. She has been fully compliant with her home Coumadin. Denies gross bleeding. -  Coumadin per pharmacy  #Diffuse interstitial markings - Noted on prior CXR 10/14, as well as today. She has diaphragm flattening consistent with emphysema. Findings are suggestive underlying component of pulmonary fibrosis. She was previously on Amiodarone, ?toxicity. - Consider chest CT as inpatient vs. outpatient - Surveillance evaluation recommended  #COPD - Patient has a history of COPD with hyperinflation and diaphragmatic flattening on chest x-ray. - Duonebs every 6 hours as needed  #CAD - Continue Pletal 100mg  daily  #GERD - protonic 40 mg daily  #HTN - currently normotensive. - Continue on ramipril 10 mg daily  #History of hypokalemia - 3.7 here. Takes K-Dur at home. - Continue to monitor and replete as needed  #DM2 - Diet controlled at home. - CBG AC and QHS  #  Diet - Carb modified  #DVT PPX - Coumadin per pharmacy   Dispo: Disposition is deferred at this time, awaiting improvement of current medical problems. Anticipated discharge in approximately 1-3 day(s).   The patient does have a current PCP Hayden Rasmussen, MD) and does need an [facility] hospital follow-up appointment after discharge.  The patient does not have transportation limitations that hinder transportation to clinic appointments.  Signed: Lesly Dukes, MD 10/20/2013, 5:19 PM

## 2013-10-21 ENCOUNTER — Telehealth: Payer: Self-pay | Admitting: Cardiovascular Disease

## 2013-10-21 DIAGNOSIS — I2510 Atherosclerotic heart disease of native coronary artery without angina pectoris: Secondary | ICD-10-CM

## 2013-10-21 DIAGNOSIS — I4891 Unspecified atrial fibrillation: Secondary | ICD-10-CM

## 2013-10-21 DIAGNOSIS — J449 Chronic obstructive pulmonary disease, unspecified: Secondary | ICD-10-CM

## 2013-10-21 DIAGNOSIS — R6883 Chills (without fever): Secondary | ICD-10-CM

## 2013-10-21 DIAGNOSIS — R0902 Hypoxemia: Secondary | ICD-10-CM

## 2013-10-21 DIAGNOSIS — R059 Cough, unspecified: Secondary | ICD-10-CM

## 2013-10-21 DIAGNOSIS — E119 Type 2 diabetes mellitus without complications: Secondary | ICD-10-CM

## 2013-10-21 DIAGNOSIS — I10 Essential (primary) hypertension: Secondary | ICD-10-CM

## 2013-10-21 DIAGNOSIS — R05 Cough: Secondary | ICD-10-CM

## 2013-10-21 DIAGNOSIS — K219 Gastro-esophageal reflux disease without esophagitis: Secondary | ICD-10-CM

## 2013-10-21 LAB — GLUCOSE, CAPILLARY
Glucose-Capillary: 81 mg/dL (ref 70–99)
Glucose-Capillary: 98 mg/dL (ref 70–99)
Glucose-Capillary: 163 mg/dL — ABNORMAL HIGH (ref 70–99)

## 2013-10-21 LAB — INFLUENZA PANEL BY PCR (TYPE A & B)
H1N1 flu by pcr: NOT DETECTED
INFLAPCR: NEGATIVE
Influenza B By PCR: NEGATIVE

## 2013-10-21 LAB — CBC
HCT: 38.8 % (ref 36.0–46.0)
Hemoglobin: 13.4 g/dL (ref 12.0–15.0)
MCH: 30.8 pg (ref 26.0–34.0)
MCHC: 34.5 g/dL (ref 30.0–36.0)
MCV: 89.2 fL (ref 78.0–100.0)
Platelets: 237 10*3/uL (ref 150–400)
RBC: 4.35 MIL/uL (ref 3.87–5.11)
RDW: 13.8 % (ref 11.5–15.5)
WBC: 13.6 10*3/uL — AB (ref 4.0–10.5)

## 2013-10-21 LAB — HIV ANTIBODY (ROUTINE TESTING W REFLEX): HIV: NONREACTIVE

## 2013-10-21 LAB — BASIC METABOLIC PANEL
BUN: 15 mg/dL (ref 6–23)
CHLORIDE: 97 meq/L (ref 96–112)
CO2: 27 meq/L (ref 19–32)
CREATININE: 0.83 mg/dL (ref 0.50–1.10)
Calcium: 8.8 mg/dL (ref 8.4–10.5)
GFR calc Af Amer: 71 mL/min — ABNORMAL LOW (ref >90)
GFR calc non Af Amer: 62 mL/min — ABNORMAL LOW (ref >90)
Glucose, Bld: 113 mg/dL — ABNORMAL HIGH (ref 70–99)
Potassium: 3.9 mEq/L (ref 3.7–5.3)
Sodium: 137 mEq/L (ref 137–147)

## 2013-10-21 LAB — LEGIONELLA ANTIGEN, URINE: LEGIONELLA ANTIGEN, URINE: NEGATIVE

## 2013-10-21 LAB — T4, FREE: Free T4: 1.77 ng/dL (ref 0.80–1.80)

## 2013-10-21 LAB — PROTIME-INR
INR: 3.54 — ABNORMAL HIGH (ref 0.00–1.49)
Prothrombin Time: 34.1 seconds — ABNORMAL HIGH (ref 11.6–15.2)

## 2013-10-21 LAB — TROPONIN I: Troponin I: <0.3 ng/mL (ref <0.30)

## 2013-10-21 LAB — TSH: TSH: 0.329 u[IU]/mL — AB (ref 0.350–4.500)

## 2013-10-21 MED ORDER — IPRATROPIUM BROMIDE 0.02 % IN SOLN
0.5000 mg | Freq: Three times a day (TID) | RESPIRATORY_TRACT | Status: DC
  Administered 2013-10-21: 0.5 mg via RESPIRATORY_TRACT
  Filled 2013-10-21: qty 2.5

## 2013-10-21 MED ORDER — ALBUTEROL SULFATE (2.5 MG/3ML) 0.083% IN NEBU
2.5000 mg | INHALATION_SOLUTION | RESPIRATORY_TRACT | Status: DC | PRN
  Administered 2013-10-22: 2.5 mg via RESPIRATORY_TRACT
  Filled 2013-10-21: qty 3

## 2013-10-21 MED ORDER — ALBUTEROL SULFATE (2.5 MG/3ML) 0.083% IN NEBU
2.5000 mg | INHALATION_SOLUTION | Freq: Three times a day (TID) | RESPIRATORY_TRACT | Status: DC
  Administered 2013-10-21 (×2): 2.5 mg via RESPIRATORY_TRACT
  Filled 2013-10-21 (×2): qty 3

## 2013-10-21 NOTE — H&P (Signed)
INTERNAL MEDICINE TEACHING SERVICE Attending Admission Note  Date: 10/21/2013  Patient name: Cheyenne [name]  Medical record number: [id number]  Date of birth: May 09, 1925    I have seen and evaluated Katheryne Anschutz and discussed their care with the Residency Team.  80 yr old woman with pmhx significant for CAD, P-Afib on coumadin, HTN, HL, presented with fast hear rate. She noticed that her heart rate went over 140 bpm so she called her cardiologist who asked her to go to the ED. She admits to productive cough, chills. She denies SOB, CP, N/V. Noted hx of recent failed cardioversion. She has been weaned off amiodarone by her cardiologist. She is now on dilt and toprol. She was noted to have O2 sat of 82% on RA at rest and 99% on 5 L [place] at rest. On exam, Filed Vitals:   10/21/13 0829  BP: 131/77  Pulse: 88  Temp: 97.7 F (36.5 C)  Resp: 31   GEN: AAOx3, NAD CV: S1S2, irreg, no m/r/g. PULM: crackles bilateral bases. ABD/GI: Soft, NT, +BS, no guarding. LE: trace edema. 2/4 pulses.  CXR: interstitial prominence, difficult to distinguish underlying interstitial disease/fibrosis vs atypical infection. She has a WBC of 14k.  Based on her presentation, I agree with tx for CAP. This may have resulted in uncontrolled Afib. Continue Rocephin/azithro. Ambulate with PT to determine O2 req changes. Follow INR closely. Can check FT4 as well, this may be more useful than TSH.  Would have high res CT chest done as outpatient. Underlying interstitial lung disease due to amiodarone is possible.  Outpatient PFTs may be useful.  Dominic Pea, DO, [facility] 10/21/2013, 10:49 AM

## 2013-10-21 NOTE — Evaluation (Signed)
Physical Therapy Evaluation Patient Details Name: Cheyenne [name] MRN: [id number] DOB: March 25, 1925 Today's Date: 10/21/2013 Time: 6440-3474 PT Time Calculation (min): 28 min  PT Assessment / Plan / Recommendation History of Present Illness  Pt presents with tachycardia in setting of a-fib and COPD. Pt with CAP  Clinical Impression  Pt is independent and safe with mobility. She continues to have unstable HR, up to 120 with ambulation, and O2 sats decreased as low as 79% on RA with ambulation. Returned to 92% with 2L O2 reapplied.  No acute or f/u PT needs at this time.     PT Assessment  Patent does not need any further PT services    Follow Up Recommendations  No PT follow up    Does the patient have the potential to tolerate intense rehabilitation      Barriers to Discharge        Equipment Recommendations  None recommended by PT    Recommendations for Other Services     Frequency      Precautions / Restrictions Precautions Precautions: None Restrictions Weight Bearing Restrictions: No   Pertinent Vitals/Pain See impression above      Mobility  Bed Mobility Overal bed mobility: Independent Transfers Overall transfer level: Independent Equipment used: None Ambulation/Gait Ambulation/Gait assistance: Independent Ambulation Distance (Feet): 200 Feet Assistive device: None Gait Pattern/deviations: WFL(Within Functional Limits) Gait velocity: WFL Gait velocity interpretation: at or above normal speed for age/gender General Gait Details: pt ambulates safely with normal speed, normal balance reactions. She experienced DOE after 150', HR increased to 120 bpm and O2 sats dropped to low 80s. Pt ambulated on RA. After a standing rest, she returned to room and HR had returned to 98, O2 reapplied and 92%    Exercises     PT Diagnosis:    PT Problem List:   PT Treatment Interventions:       PT Goals(Current goals can be found in the care plan section) Acute Rehab PT  Goals Patient Stated Goal: return home PT Goal Formulation: No goals set, d/c therapy  Visit Information  Last PT Received On: 10/21/13 Assistance Needed: +1 History of Present Illness: Pt presents with tachycardia in setting of a-fib and COPD. Pt with CAP       Prior [place] expects to be discharged to:: Private residence Living Arrangements: Alone Available Help at Discharge: Other (Comment) (none) Type of Home: House Home Access: Stairs to enter [employer] of Steps: 2 Entrance Stairs-Rails: Right;Left;Can reach both Home Layout: One level Home Equipment: Grab bars - tub/shower Additional Comments: pt's children check her by phone daily and she has a system with her next door neighbor of opening her blinds to let each other know they are OK Prior Function Level of Independence: Independent Communication Communication: No difficulties Dominant Hand: Right    Cognition  Cognition Arousal/Alertness: Awake/alert Behavior During Therapy: WFL for tasks assessed/performed Overall Cognitive Status: Within Functional Limits for tasks assessed    Extremity/Trunk Assessment Upper Extremity Assessment Upper Extremity Assessment: Overall WFL for tasks assessed Lower Extremity Assessment Lower Extremity Assessment: Overall WFL for tasks assessed Cervical / Trunk Assessment Cervical / Trunk Assessment: Normal   Balance Balance Overall balance assessment: Modified Independent  End of Session PT - End of Session Equipment Utilized During Treatment: Oxygen Activity Tolerance: Patient tolerated treatment well Patient left: in bed;with call bell/phone within reach Nurse Communication: Other (comment) (O2 status)  GP   Leighton Roach, [place]  [place], [place] 10/21/2013, 12:25 PM

## 2013-10-21 NOTE — Progress Notes (Signed)
Subjective: Ms. Torman reports that she has been feeling well overnight.  She says she does not feel short of breath and hasn't felt tachycardic.  The nurse reported no overnight issues.  Her O2 sats have been in the 95-100% on 1.5L of oxygen.  Objective: Vital signs in last 24 hours: Filed Vitals:   10/21/13 0000 10/21/13 0353 10/21/13 0829 10/21/13 0835  BP: 146/68 140/84 131/77   Pulse: 82 88 88   Temp: 98.3 F (36.8 C) 98.4 F (36.9 C) 97.7 F (36.5 C)   TempSrc: Oral Oral Oral   Resp: 16 22 31    Height:      Weight:  64.3 kg (141 lb 12.1 oz)    SpO2: 97% 100% 94% 93%   Weight change:   Intake/Output Summary (Last 24 hours) at 10/21/13 0919 Last data filed at 10/21/13 0300  Gross per 24 hour  Intake    123 ml  Output    550 ml  Net   -427 ml   BP 131/77  Pulse 88  Temp(Src) 97.7 F (36.5 C) (Oral)  Resp 31  Ht 5\' 2"  (1.575 m)  Wt 64.3 kg (141 lb 12.1 oz)  BMI 25.92 kg/m2  SpO2 93% Constitutional: She is oriented to person, place, and time and well-developed, well-nourished, and in no distress, eating breakfast Head: Normocephalic and atraumatic.  Eyes: Conjunctivae and EOM are normal. Pupils are equal, round, and reactive to light.  Cardiovascular: Normal rate. An irregularly irregular rhythm present. Exam reveals no gallop and no friction rub.  No murmur heard. Pulmonary/Chest: Effort normal. No respiratory distress. She has no wheezes. She has crackles in the left lung base.  Abdominal: Soft. Bowel sounds are normal. She exhibits no distension. There is no tenderness.  Musculoskeletal: Normal range of motion. She exhibits no edema and no tenderness.  Neurological: She is alert and oriented to person, place, and time. No cranial nerve deficit.  Skin: Skin is warm and dry.  Psychiatric: Mood, memory, affect and judgment normal.   Lab Results: Results for orders placed during the hospital encounter of 10/20/13 (from the past 24 hour(s))  BASIC METABOLIC PANEL      Status: Abnormal   Collection Time    10/20/13 12:40 PM      Result Value Ref Range   Sodium 138  137 - 147 mEq/L   Potassium 3.7  3.7 - 5.3 mEq/L   Chloride 95 (*) 96 - 112 mEq/L   CO2 28  19 - 32 mEq/L   Glucose, Bld 147 (*) 70 - 99 mg/dL   BUN 15  6 - 23 mg/dL   Creatinine, Ser 0.97  0.50 - 1.10 mg/dL   Calcium 9.0  8.4 - 10.5 mg/dL   GFR calc non Af Amer 51 (*) >90 mL/min   GFR calc Af Amer 59 (*) >90 mL/min  CBC     Status: Abnormal   Collection Time    10/20/13 12:40 PM      Result Value Ref Range   WBC 14.6 (*) 4.0 - 10.5 K/uL   RBC 4.79  3.87 - 5.11 MIL/uL   Hemoglobin 15.1 (*) 12.0 - 15.0 g/dL   HCT 43.1  36.0 - 46.0 %   MCV 90.0  78.0 - 100.0 fL   MCH 31.5  26.0 - 34.0 pg   MCHC 35.0  30.0 - 36.0 g/dL   RDW 14.0  11.5 - 15.5 %   Platelets 256  150 - 400 K/uL  MAGNESIUM  Status: None   Collection Time    10/20/13 12:40 PM      Result Value Ref Range   Magnesium 1.6  1.5 - 2.5 mg/dL  PRO B NATRIURETIC PEPTIDE     Status: Abnormal   Collection Time    10/20/13 12:40 PM      Result Value Ref Range   Pro B Natriuretic peptide (BNP) 1789.0 (*) 0 - 450 pg/mL  PROTIME-INR     Status: Abnormal   Collection Time    10/20/13 12:40 PM      Result Value Ref Range   Prothrombin Time 36.0 (*) 11.6 - 15.2 seconds   INR 3.80 (*) 0.00 - 1.49  I-STAT TROPOININ, ED     Status: None   Collection Time    10/20/13  1:32 PM      Result Value Ref Range   Troponin i, poc 0.01  0.00 - 0.08 ng/mL   Comment 3           I-STAT ARTERIAL BLOOD GAS, ED     Status: Abnormal   Collection Time    10/20/13  4:01 PM      Result Value Ref Range   pH, Arterial 7.427  7.350 - 7.450   pCO2 arterial 38.7  35.0 - 45.0 mmHg   pO2, Arterial 48.0 (*) 80.0 - 100.0 mmHg   Bicarbonate 25.5 (*) 20.0 - 24.0 mEq/L   TCO2 27  0 - 100 mmol/L   O2 Saturation 84.0     Acid-Base Excess 1.0  0.0 - 2.0 mmol/L   Patient temperature 98.6 F     Collection site RADIAL, ALLEN'S TEST ACCEPTABLE     Drawn by  RT     Sample type ARTERIAL    MRSA PCR SCREENING     Status: None   Collection Time    10/20/13  7:15 PM      Result Value Ref Range   MRSA by PCR NEGATIVE  NEGATIVE  TROPONIN I     Status: None   Collection Time    10/20/13  8:20 PM      Result Value Ref Range   Troponin I <0.30  <0.30 ng/mL  TSH     Status: Abnormal   Collection Time    10/20/13  8:20 PM      Result Value Ref Range   TSH 0.329 (*) 0.350 - 4.500 uIU/mL  HIV ANTIBODY (ROUTINE TESTING)     Status: None   Collection Time    10/20/13  8:20 PM      Result Value Ref Range   HIV NON REACTIVE  NON REACTIVE  GLUCOSE, CAPILLARY     Status: Abnormal   Collection Time    10/20/13  9:27 PM      Result Value Ref Range   Glucose-Capillary 209 (*) 70 - 99 mg/dL  STREP PNEUMONIAE URINARY ANTIGEN     Status: None   Collection Time    10/20/13  9:30 PM      Result Value Ref Range   Strep Pneumo Urinary Antigen NEGATIVE  NEGATIVE  BASIC METABOLIC PANEL     Status: Abnormal   Collection Time    10/21/13  1:07 AM      Result Value Ref Range   Sodium 137  137 - 147 mEq/L   Potassium 3.9  3.7 - 5.3 mEq/L   Chloride 97  96 - 112 mEq/L   CO2 27  19 - 32 mEq/L   Glucose, Bld  113 (*) 70 - 99 mg/dL   BUN 15  6 - 23 mg/dL   Creatinine, Ser 0.83  0.50 - 1.10 mg/dL   Calcium 8.8  8.4 - 10.5 mg/dL   GFR calc non Af Amer 62 (*) >90 mL/min   GFR calc Af Amer 71 (*) >90 mL/min  CBC     Status: Abnormal   Collection Time    10/21/13  1:07 AM      Result Value Ref Range   WBC 13.6 (*) 4.0 - 10.5 K/uL   RBC 4.35  3.87 - 5.11 MIL/uL   Hemoglobin 13.4  12.0 - 15.0 g/dL   HCT 38.8  36.0 - 46.0 %   MCV 89.2  78.0 - 100.0 fL   MCH 30.8  26.0 - 34.0 pg   MCHC 34.5  30.0 - 36.0 g/dL   RDW 13.8  11.5 - 15.5 %   Platelets 237  150 - 400 K/uL  PROTIME-INR     Status: Abnormal   Collection Time    10/21/13  1:07 AM      Result Value Ref Range   Prothrombin Time 34.1 (*) 11.6 - 15.2 seconds   INR 3.54 (*) 0.00 - 1.49  TROPONIN I      Status: None   Collection Time    10/21/13  1:07 AM      Result Value Ref Range   Troponin I <0.30  <0.30 ng/mL  GLUCOSE, CAPILLARY     Status: None   Collection Time    10/21/13  8:33 AM      Result Value Ref Range   Glucose-Capillary 81  70 - 99 mg/dL    Micro Results: Recent Results (from the past 240 hour(s))  MRSA PCR SCREENING     Status: None   Collection Time    10/20/13  7:15 PM      Result Value Ref Range Status   MRSA by PCR NEGATIVE  NEGATIVE Final   Comment:            The GeneXpert MRSA Assay (FDA     approved for NASAL specimens     only), is one component of a     comprehensive MRSA colonization     surveillance program. It is not     intended to diagnose MRSA     infection nor to guide or     monitor treatment for     MRSA infections.   Studies/Results: Dg Chest 2 View  10/20/2013   CLINICAL DATA:  IRREGULAR HEART BEAT  EXAM: CHEST  2 VIEW  COMPARISON:  DG CHEST 1V PORT dated 05/12/2013  FINDINGS: There is flattening of the hemidiaphragms and increased AP diameter of the chest. Patient is status post mediastinum and coronary artery bypass grafting. Cardiac silhouette is enlarged. Atherosclerotic calcifications identified within the aorta. There is prominence of the interstitial markings and areas of mild peribronchial cuffing. Areas increased density projects within the right left lung bases. The osseous structures demonstrate no evidence of acute abnormalities.  IMPRESSION: COPD. Differential consideration of interstitial findings are component of pulmonary edema as well as a underlying component of pulmonary fibrosis. An infectious or inflammatory infiltrate within the lung bases cannot be excluded clinically appropriate. Surveillance evaluation recommended.   Electronically Signed   By: Margaree Mackintosh M.D.   On: 10/20/2013 13:53   Medications: I have reviewed the patient's current medications. Scheduled Meds: . albuterol  2.5 mg Nebulization Q6H  . atorvastatin   80  mg Oral QPM  . azithromycin  500 mg Oral Q24H  . cefTRIAXone (ROCEPHIN)  IV  1 g Intravenous Q24H  . cilostazol  100 mg Oral BID  . diltiazem  240 mg Oral Daily  . ipratropium  0.5 mg Nebulization Q6H  . isosorbide mononitrate  30 mg Oral QAC breakfast  . metoprolol succinate  100 mg Oral BID  . pantoprazole  40 mg Oral Daily  . ramipril  10 mg Oral Daily  . sodium chloride  3 mL Intravenous Q12H  . Warfarin - Pharmacist Dosing Inpatient   Does not apply q1800   Continuous Infusions:  PRN Meds:.fluticasone, guaiFENesin, ondansetron (ZOFRAN) IV, ondansetron Assessment/Plan: Active Problems:   DIABETES MELLITUS   COPD   CAD (coronary artery disease)   Atrial flutter   CAP (community acquired pneumonia)   Acute respiratory failure with hypoxia  Assessment & Plan by Problem:  Cheyenne [name] is a 80 y.o. female with PMH paroxysmal atrial fibrillation with RVR on Coumadin, CAD (s/p inferior MI with PTCA of RCA and CABG in 1997, normal follow-up NST with EF 73% on 01/2013), HTN, HLD, GERD who presented to the ED with a chief complaint of tachycardia, with productive cough and new oxygen requirement.   #Hypoxia, likely 2/2 CAP - The patient has had a weeklong history of cough and chills.  CXR likely for infiltrate at the bases.  The CXR also shows COPD and potentially lung fibrosis.  Patient presents with a weeklong history of cough productive of yellow sputum and chills. CXR shows infiltrate at the lung bases. Leukocytosis 14.6--> 13.6.  Legionella and strep pneumonia urinary antigens negative. Hypoxia is improving with nasal cannula.  V/Q mismatch seen is likely due to pneumonia vs. Hypoventilation from ILD.  - Discussed her care with her cardiologist, Dr. Claiborne Billings.  Asked about the discontinuation of Amiodarone - this was due to the primary decision to treat her Afib with rate control to avoid any complications of anti-arrythmics.  He did not suspect pulmonary fibrosis at the time he  discontinued Amiodarone. - Continue ceftriaxone and azithromycin IV for CAP - Sputum culture, blood cultures pending.  Flu panel pending.  - Oxygen therapy prn to keep O2 saturation greater then 90%  - Will plan to walk her this afternoon without O2 monitoring - Continuous pulse oximetry  - Zofran when necessary for nausea - PT eval and treat   #Atrial fibrillation with episodes of RVR - Patient has a known history of paroxysmal A. fib with episodes of RVR. She is rate controlled only. She was weaned off her Amiodarone in December 2014. HR has been <100 overnight.  Likely trigger is community acquired pneumonia, as above. Troponins negative x3.  Discussed her case with cardiology, as above. - Continue home diltiazem 240 mg daily  - Continue home Toprol-XL 100 mg twice daily (at home she takes 50mg  for pm dose on odd days)  - TSH low, T4 pending - Telemetry   #Supratherapeutic INR on Coumadin - 3.8 on admission. She has been fully compliant with her home Coumadin. Denies gross bleeding.  - Coumadin per pharmacy   #Diffuse interstitial markings - Noted on prior CXR 10/14, as well as today. She has diaphragm flattening consistent with emphysema. Findings are suggestive underlying component of pulmonary fibrosis. She was previously on Amiodarone, so toxicity is on the differential. - CT Chest should be done as an outpatient - Surveillance evaluation recommended   #COPD - Patient has a history of COPD with hyperinflation and  diaphragmatic flattening on chest x-ray.  - Duonebs every 6 hours as needed   #CAD - Continue Pletal 100mg  daily   #GERD - Protonix 40 mg daily   #HTN - Currently normotensive.  - Continue on ramipril 10 mg daily   #History of hypokalemia - 3.7 here. Takes K-Dur at home.  - Continue to monitor and replete as needed   #DM2 - Diet controlled at home.  - CBG AC and QHS   #Diet - Carb modified   #DVT PPX - Coumadin per pharmacy   Dispo: Disposition is deferred at  this time, awaiting improvement of current medical problems. Anticipated discharge in approximately 1-3 day(s).   The patient does have a current PCP Hayden Rasmussen, MD) and does need an [facility] hospital follow-up appointment after discharge.  This is a Careers information officer Note.  The care of the patient was discussed with Dr. Elnora Morrison and the assessment and plan formulated with their assistance.  Please see their attached note for official documentation of the daily encounter.   LOS: 1 day   Judy Pimple, Med Student 10/21/2013, 9:19 AM

## 2013-10-21 NOTE — ED Provider Notes (Signed)
Medical screening examination/treatment/procedure(s) were conducted as a shared visit with non-physician practitioner(s) and myself.  I personally evaluated the patient during the encounter.   EKG Interpretation   Date/Time:  Tuesday October 20 2013 12:29:42 EDT Ventricular Rate:  116 PR Interval:    QRS Duration: 78 QT Interval:  348 QTC Calculation: 483 R Axis:   -81 Text Interpretation:  Atrial fibrillation with rapid ventricular response  with premature ventricular or aberrantly conducted complexes Left axis  deviation Anterior infarct , age undetermined Abnormal ECG Since last  tracing 11/14,  Normal sinus rhythm replaced with afib Confirmed by MILLER   MD, BRIAN ([id number]) on 10/20/2013 12:35:35 PM      Patient presents with palpitations and tachycardia. Patient also reports cough and chills. History of atrial fibrillation. Noted heart rates in the 140s. Denies any chest pain. Heart rate noted between 100-120.  EKG shows atrial flutter. Patient is otherwise nontoxic.  Rhonchi and wheezing on exam. Patient requiring supplemental oxygen. Chest x-ray concerning for pneumonia. Will admit.  Merryl Hacker, MD 10/21/13 [phone]

## 2013-10-21 NOTE — Progress Notes (Signed)
I have seen the patient and reviewed the daily progress note by Ardith Dark MS4 and discussed the care of the patient with them.  See below for documentation of my findings, assessment, and plans.  Subjective: The patient notes feeling "much better" today.  She notes no further palpitations, and HR has been well-controlled for most of the day.  The patient continues to desat with exertion.  Objective: Vital signs in last 24 hours: Filed Vitals:   10/21/13 1145 10/21/13 1155 10/21/13 1300 10/21/13 1440  BP:   117/77   Pulse:  120 108 90  Temp:   98 F (36.7 C)   TempSrc:   Oral   Resp:   24 23  Height:      Weight:      SpO2: 80% 92% 93% 93%   Weight change:   Intake/Output Summary (Last 24 hours) at 10/21/13 1445 Last data filed at 10/21/13 1056  Gross per 24 hour  Intake    713 ml  Output    800 ml  Net    -87 ml  General: alert, cooperative, and in no apparent distress HEENT: pupils equal round and reactive to light, vision grossly intact, oropharynx clear and non-erythematous  Neck: supple Lungs: normal work of respiration, crackles at left lung base Heart: irregularly irregular rhythm, regular rate, no m/g/r Abdomen: soft, non-tender, non-distended, normal bowel sounds Extremities: no cyanosis, clubbing, or edema Neurologic: alert & oriented X3, cranial nerves II-XII intact, strength grossly intact, sensation intact to light touch  Lab Results: Reviewed and documented in Electronic Record Micro Results: Reviewed and documented in Electronic Record Studies/Results: Reviewed and documented in Electronic Record Medications: I have reviewed the patient's current medications. Scheduled Meds: . albuterol  2.5 mg Nebulization TID  . atorvastatin  80 mg Oral QPM  . azithromycin  500 mg Oral Q24H  . cefTRIAXone (ROCEPHIN)  IV  1 g Intravenous Q24H  . cilostazol  100 mg Oral BID  . diltiazem  240 mg Oral Daily  . ipratropium  0.5 mg Nebulization TID  . isosorbide  mononitrate  30 mg Oral QAC breakfast  . metoprolol succinate  100 mg Oral BID  . pantoprazole  40 mg Oral Daily  . ramipril  10 mg Oral Daily  . sodium chloride  3 mL Intravenous Q12H  . Warfarin - Pharmacist Dosing Inpatient   Does not apply q1800   Continuous Infusions:  PRN Meds:.albuterol, fluticasone, guaiFENesin, ondansetron (ZOFRAN) IV, ondansetron Assessment/Plan:  #Hypoxia, likely 2/2 CAP - The patient presents with 1 week of productive cough and chills, with CXR showing infiltrate concerning for CAP.  Elevated A-A gradient on ABG may represent PNA, though evaluation for underlying pulmonary conditions (such as ILD) may be considered in the outpatient setting after resolution of PNA. - Continue ceftriaxone and azithromycin IV  - follow-up cultures - urine strep, urine legionella, and flu panel negative - Oxygen therapy prn to keep O2 saturation greater then 90%  - Continuous pulse oximetry  - Zofran when necessary for nausea  - PT eval and treat   #Atrial fibrillation with episodes of RVR - Patient has a known history of paroxysmal A. fib with episodes of RVR. She is rate controlled only.  - Continue home diltiazem 240 mg daily  - Continue home Toprol-XL 100 mg twice daily (at home she takes 50mg  for pm dose on odd days)  - TSH mildly low, checking free T4  #Supratherapeutic INR on Coumadin - Coumadin per pharmacy   #  Diffuse interstitial markings - Noted on prior CXR 10/14, as well as today. She has diaphragm flattening consistent with emphysema. Findings are suggestive underlying component of pulmonary fibrosis. She was previously on Amiodarone, ?toxicity.  - Consider outpatient chest CT after resolution of CAP  #COPD - Patient has a history of COPD with hyperinflation and diaphragmatic flattening on chest x-ray.  - Duonebs every 6 hours as needed   #CAD - Continue Pletal 100mg  daily   #GERD - protonic 40 mg daily   #HTN - currently normotensive.  - Continue on  ramipril 10 mg daily   #History of hypokalemia - 3.7 here. Takes K-Dur at home.  - Continue to monitor and replete as needed   #DM2 - Diet controlled at home.  - CBG AC and QHS   #Diet - Carb modified   #DVT PPX - Coumadin per pharmacy  Dispo: Disposition is deferred at this time, awaiting improvement of current medical problems.  Anticipated discharge in approximately 1-2 day(s).   The patient does have a current PCP Hayden Rasmussen, MD) and does not need an [facility] hospital follow-up appointment after discharge.  .Services Needed at time of discharge: Y = Yes, Blank = No PT:   OT:   RN:   Equipment:   Other:     LOS: 1 day   Hester Mates, MD 10/21/2013, 2:45 PM

## 2013-10-21 NOTE — Progress Notes (Signed)
ANTICOAGULATION CONSULT NOTE - Follow Up Consult  Pharmacy Consult for Coumadin Indication: atrial fibrillation  Allergies  Allergen Reactions  . Elavil [Amitriptyline] Other (See Comments)    Felt drunk  . Nifedipine Other (See Comments)    Heart races  . Tegretol [Carbamazepine] Other (See Comments)    Felt drunk    Patient Measurements: Height: 5\' 2"  (157.5 cm) Weight: 141 lb 12.1 oz (64.3 kg) IBW/kg (Calculated) : 50.1  Vital Signs: Temp: 97.7 F (36.5 C) (03/18 0829) Temp src: Oral (03/18 0829) BP: 131/77 mmHg (03/18 0829) Pulse Rate: 88 (03/18 0829)  Labs:  Recent Labs  10/20/13 1240 10/20/13 2020 10/21/13 0107 10/21/13 0827  HGB 15.1*  --  13.4  --   HCT 43.1  --  38.8  --   PLT 256  --  237  --   LABPROT 36.0*  --  34.1*  --   INR 3.80*  --  3.54*  --   CREATININE 0.97  --  0.83  --   TROPONINI  --  <0.30 <0.30 <0.30    Estimated Creatinine Clearance: 42.1 ml/min (by C-G formula based on Cr of 0.83).   Assessment: 80 y/o female on chronic Coumadin for Afib. INR is supratherapeutic at 3.54 today. On antibiotics which can increase INR. No bleeding noted, CBC is normal.  Home dose is 2.5mg  daily except 5mg  on Sunday (confirmed with patient).  Goal of Therapy:  INR 2-3 Monitor platelets by anticoagulation protocol: Yes   Plan:  - No Coumadin tonight - INR daily - Monitor for s/sx of bleeding  [facility], Pharm.D., BCPS Clinical Pharmacist Pager: [phone] 10/21/2013 11:03 AM

## 2013-10-22 DIAGNOSIS — Z7901 Long term (current) use of anticoagulants: Secondary | ICD-10-CM

## 2013-10-22 DIAGNOSIS — J189 Pneumonia, unspecified organism: Principal | ICD-10-CM

## 2013-10-22 LAB — CBC
HCT: 44.3 % (ref 36.0–46.0)
HEMOGLOBIN: 15.1 g/dL — AB (ref 12.0–15.0)
MCH: 31 pg (ref 26.0–34.0)
MCHC: 34.1 g/dL (ref 30.0–36.0)
MCV: 91 fL (ref 78.0–100.0)
Platelets: 278 10*3/uL (ref 150–400)
RBC: 4.87 MIL/uL (ref 3.87–5.11)
RDW: 14 % (ref 11.5–15.5)
WBC: 15.9 10*3/uL — ABNORMAL HIGH (ref 4.0–10.5)

## 2013-10-22 LAB — GLUCOSE, CAPILLARY
GLUCOSE-CAPILLARY: 127 mg/dL — AB (ref 70–99)
Glucose-Capillary: 115 mg/dL — ABNORMAL HIGH (ref 70–99)
Glucose-Capillary: 200 mg/dL — ABNORMAL HIGH (ref 70–99)

## 2013-10-22 LAB — BASIC METABOLIC PANEL WITH GFR
BUN: 13 mg/dL (ref 6–23)
CO2: 24 meq/L (ref 19–32)
Calcium: 9.1 mg/dL (ref 8.4–10.5)
Chloride: 92 meq/L — ABNORMAL LOW (ref 96–112)
Creatinine, Ser: 0.78 mg/dL (ref 0.50–1.10)
GFR calc Af Amer: 85 mL/min — ABNORMAL LOW
GFR calc non Af Amer: 73 mL/min — ABNORMAL LOW
Glucose, Bld: 168 mg/dL — ABNORMAL HIGH (ref 70–99)
Potassium: 4.2 meq/L (ref 3.7–5.3)
Sodium: 132 meq/L — ABNORMAL LOW (ref 137–147)

## 2013-10-22 LAB — PROTIME-INR
INR: 2.53 — ABNORMAL HIGH (ref 0.00–1.49)
PROTHROMBIN TIME: 26.4 s — AB (ref 11.6–15.2)

## 2013-10-22 MED ORDER — DSS 100 MG PO CAPS: 100.0000 mg | ORAL_CAPSULE | Freq: Two times a day (BID) | ORAL | Status: DC

## 2013-10-22 MED ORDER — IPRATROPIUM-ALBUTEROL 0.5-2.5 (3) MG/3ML IN SOLN
3.0000 mL | Freq: Three times a day (TID) | RESPIRATORY_TRACT | Status: DC
  Administered 2013-10-22: 3 mL via RESPIRATORY_TRACT
  Filled 2013-10-22: qty 3

## 2013-10-22 MED ORDER — DOCUSATE SODIUM 100 MG PO CAPS
100.0000 mg | ORAL_CAPSULE | Freq: Every day | ORAL | Status: DC | PRN
  Administered 2013-10-22: 100 mg via ORAL
  Filled 2013-10-22: qty 1

## 2013-10-22 MED ORDER — WARFARIN SODIUM 5 MG PO TABS
5.0000 mg | ORAL_TABLET | Freq: Once | ORAL | Status: DC
  Filled 2013-10-22: qty 1

## 2013-10-22 MED ORDER — AZITHROMYCIN 500 MG PO TABS: 500.0000 mg | ORAL_TABLET | ORAL | Status: DC

## 2013-10-22 NOTE — Progress Notes (Signed)
  Date: 10/22/2013  Patient name: Cheyenne [name]  Medical record number: [id number]  Date of birth: 1925-05-11   This patient has been seen and the plan of care was discussed with the house staff. Please see their note for complete details. I concur with their findings with the following additions/corrections: Clinically improved today. She is eager to go home. Will need O2 at 2 L/min with ambulation due to hypoxia secondary to ILD (likely underlying emphysema as well) as hypoxia likely caused uncontrolled Afib. Complete therapy as outpatient with azithro. Needs outpatient PCP follow up. Please have her INR checked as outpatient within 3-5 days due to tx with abx. She will need further outpatient follow up of her likely ILD. Medically stable for D/C.  Dominic Pea, DO, [facility] 10/22/2013, 2:06 PM

## 2013-10-22 NOTE — Discharge Summary (Signed)
Name: Cheyenne [name] MRN: [id number] DOB: 07-05-1925 80 y.o. PCP: Hayden Rasmussen, MD  Date of Admission: 10/20/2013 12:41 PM Date of Discharge: 10/22/2013 Attending Physician: Dr. Dominic Pea  Discharge Diagnosis: Principal Problem:   Acute respiratory failure with hypoxia Active Problems:   DIABETES MELLITUS   COPD   CAD (coronary artery disease)   Atrial flutter   CAP (community acquired pneumonia)  Discharge Medications:   Medication List         acetaminophen 500 MG tablet  Commonly known as:  TYLENOL  Take 1,000 mg by mouth every 6 (six) hours as needed. For pain     ALPRAZolam 0.25 MG tablet  Commonly known as:  XANAX  Take 0.5 mg by mouth at bedtime. For anxiety     atorvastatin 80 MG tablet  Commonly known as:  LIPITOR  Take 1 tablet (80 mg total) by mouth every evening.     azithromycin 500 MG tablet  Commonly known as:  ZITHROMAX  Take 1 tablet (500 mg total) by mouth daily.     calcium carbonate 600 MG Tabs tablet  Commonly known as:  OS-CAL  Take 600 mg by mouth 2 (two) times daily with a meal.     cilostazol 100 MG tablet  Commonly known as:  PLETAL  Take 1 tablet (100 mg total) by mouth 2 (two) times daily.     dicyclomine 10 MG capsule  Commonly known as:  BENTYL  Take 10 mg by mouth daily as needed for spasms.     diltiazem 120 MG 24 hr capsule  Commonly known as:  CARDIZEM CD  Take 240 mg by mouth daily.     DSS 100 MG Caps  Take 100 mg by mouth 2 (two) times daily.     esomeprazole 40 MG capsule  Commonly known as:  NEXIUM  Take 40 mg by mouth daily before breakfast.     Fish Oil 1200 MG Caps  Take 1 capsule by mouth 2 (two) times daily.     fluticasone 50 MCG/ACT nasal spray  Commonly known as:  FLONASE  Place 2 sprays into the nose daily as needed for allergies.     furosemide 20 MG tablet  Commonly known as:  LASIX  Take 20 mg by mouth daily.     HYDROcodone-acetaminophen 5-325 MG per tablet  Commonly known as:   NORCO/VICODIN  Take 1 tablet by mouth every 6 (six) hours as needed. For pain     isosorbide mononitrate 30 MG 24 hr tablet  Commonly known as:  IMDUR  Take 30 mg by mouth daily before breakfast.     metoprolol succinate 100 MG 24 hr tablet  Commonly known as:  TOPROL-XL  On even days take 100 mg twice daily. On odd days take 1 tablet in the AM and 1/2 tablet in the PM.     potassium chloride SA 20 MEQ tablet  Commonly known as:  K-DUR,KLOR-CON  Take 20 mEq by mouth 2 (two) times daily.     psyllium 58.6 % powder  Commonly known as:  METAMUCIL  Take 1 packet by mouth daily. At lunch or supper     ramipril 10 MG tablet  Commonly known as:  ALTACE  Take 10 mg by mouth daily.     SPIRIVA HANDIHALER IN  Inhale 1 capsule into the lungs daily.     warfarin 5 MG tablet  Commonly known as:  COUMADIN  Take 2.5-5 mg by mouth daily at 3  pm. Take one tablet (5mg ) on Sundays. All other days take one half-tablet (2.5mg ).     XYZAL PO  Take 10 mg by mouth every evening.     ZETIA 10 MG tablet  Generic drug:  ezetimibe  Take 10 mg by mouth every evening.        Disposition and follow-up:   Cheyenne [name] was discharged from [facility] in Stable condition.  At the hospital follow up visit please address:  1.  Assure compliance to her antibiotic treatment for her community acquired pneumonia--azithromycin 500mg  daily for 7 days.   2.  Labs / imaging needed at time of follow-up: INR. Consider repeating CXR in 6-8 weeks for evaluation of PNA resolution or high resolution CT chest for evaluation of possible pulmonary fibrosis  3.  Pending labs/ test needing follow-up: Blood cultures from 3/18 with no growth to date at the time of her discharge.   Follow-up Appointments:     Follow-up Information   Follow up with Hayden Rasmussen., MD On 10/30/2013. (Time: 11:40am)    Specialty:  Family Medicine   Contact information:   [address] [place] [place] [address] [phone]       Follow up with Troy Sine, MD On 12/04/2013. (Time: 11:45 am)    Specialty:  Cardiology   Contact information:   [address] [place] [address] [phone]       Follow up with [facility] On 10/30/2013. (Coumadin check -- Time: 1:30pm)    Contact information:   [address] [place] [address] [phone]      Discharge Instructions: Discharge Orders   Future Appointments Provider Department Dept Phone   10/30/2013 1:30 PM Tommy Medal, [facility] [phone]   11/09/2013 10:40 AM Tommy Medal, RPH-CPP [facility] [phone]   12/04/2013 11:45 AM Troy Sine, MD [facility] [phone]   Future Orders Complete By Expires   Diet - low sodium heart healthy  As directed    Increase activity slowly  As directed       Consultations:    Procedures Performed:  Dg Chest 2 View  10/20/2013   CLINICAL DATA:  IRREGULAR HEART BEAT  EXAM: CHEST  2 VIEW  COMPARISON:  DG CHEST 1V PORT dated 05/12/2013  FINDINGS: There is flattening of the hemidiaphragms and increased AP diameter of the chest. Patient is status post mediastinum and coronary artery bypass grafting. Cardiac silhouette is enlarged. Atherosclerotic calcifications identified within the aorta. There is prominence of the interstitial markings and areas of mild peribronchial cuffing. Areas increased density projects within the right left lung bases. The osseous structures demonstrate no evidence of acute abnormalities.  IMPRESSION: COPD. Differential consideration of interstitial findings are component of pulmonary edema as well as a underlying component of pulmonary fibrosis. An infectious or inflammatory infiltrate within the lung bases cannot be excluded clinically appropriate. Surveillance evaluation recommended.   Electronically Signed   By: Margaree Mackintosh M.D.   On: 10/20/2013 13:53    Admission HPI:  Cheyenne [name] is a 80 y.o. female with PMH paroxysmal atrial fibrillation with RVR on Coumadin, CAD (s/p inferior MI with PTCA of RCA and CABG in 1997, normal follow-up NST with EF 73% on 01/2013), HTN, HLD, GERD who presents to the ED with a chief complaint of tachycardia. Her health literacy is excellent and she is a very good historian.  Cheyenne [name] has a history of AF with RVR followed  by Dr. Shelva Majestic with Cardiology. She checks her heart rate regularly at home. Yesterday and today, it went >140 a couple times, for a few seconds at a time, so she called her doctor who told her to come to the ED.  For the past week, she has also had a cough productive of yellow sputum and associated chills. She denies chest pain, shortness of breath, abdominal pain, nausea, vomiting, diarrhea, leg swelling. She has been compliant with all of her medications including Coumadin.  She was hospitalized here at [facility] in 10/14 for AF with RVR. At that time her amiodarone dose was adjusted and she underwent successful cardioversion with restoration of sinus rhythm on 06/19/2013, but ultimately converted back 8 days later at her next office visit. She was weaned off her amiodarone in December 2014 because her cardiologist felt it wasn't necessary and was concerned about the side effect profile. She is currently taking diltiazem 240 mg for rate control as well as Toprol XL - on even days she takes 100 mg twice a day and on odd days she takes 100 mg in the morning and 50 mg at night. This regimen was working well with no tachycardia until the current episodes.  In the ED, she was started on IV azithromycin and ceftriaxone and put on supplemental oxygen.   Hospital Course by problem list: 1. Community acquired pneumonia with hypoxia - She presented with 1 week of productive cough and chills, with CXR showing bibasilar infiltrate concerning for CAP.On presentation she was was hypoxic with 84% O2 saturation on RA  with a new O2 supplementation requirement of up to 6L with O2 saturations to 94%. She had a mildly elevated A-A gradient on ABG (to 50) which may represent PNA, though evaluation for underlying pulmonary conditions (such as ILD) may be considered in the outpatient setting after resolution of PNA. Blood cultures NGTD. Urine cultures no growth. Urine strep antigen, urine legionella antigen, and flu panel negative. She required oxygen during her hospitalization but was gradually weaned off. On the day of discharge she was able to maintain an oxygen saturation 88% on room air while ambulating. She was treated with Ceftriaxone and Azithromycin IV while hospitalized, and was discharged with Azithromycin 500mg  daily x 7days. She will need revaluation of home oxygen requirement will need to be followed-up as an outpatient.   2. Atrial fibrillation -  She has a known history of paroxysmal atrial fibrillation with episodes of RVR in the past but her HR remained under 110s during this hospitalization. At home she is rate controlled with Diltiazem and Toprol and anticoagulated with warfarin. We held her warfarin briefly due to a supra therapeutic INR but continued her Toprol and Diltiazem. Troponins negative x3 with no EKG changes. She will resume her home medications upon her discharge and follow up with her coumadin clinic for INR recheck.    3. Supra-therapeutic INR on Coumadin - INR of 3.8 on admission. She has been fully compliant with her home Coumadin. Denies gross bleeding. She was discharged on her home dose, and has a repeat INR scheduled for the week after her discharge.    4. Diffuse interstitial lung markings - These were noted on prior CXR 10/14, as well as on admission. She has diaphragm flattening consistent with emphysema. Findings are suggestive of underlying component of pulmonary fibrosis. She was previously on Amiodarone (weaned off in December 2014), and toxicity should be on the differential for her  lung disease. She may benefit from High  resolution CT chest in the outpatient setting for further work up of this possible ILD or pulmonary fibrosis.   5. CAD, GERD, and HTN - All stable. We continued her home medications during the hospitalization.    Discharge Vitals:   BP 123/72  Pulse 92  Temp(Src) 97.5 F (36.4 C) (Oral)  Resp 22  Ht 5\' 2"  (1.575 m)  Wt 141 lb 1.5 oz (64 kg)  BMI 25.80 kg/m2  SpO2 91%  Discharge Labs:  Results for orders placed during the hospital encounter of 10/20/13 (from the past 24 hour(s))  GLUCOSE, CAPILLARY     Status: Abnormal   Collection Time    10/21/13 11:12 PM      Result Value Ref Range   Glucose-Capillary 127 (*) 70 - 99 mg/dL   Comment 1 Notify RN    PROTIME-INR     Status: Abnormal   Collection Time    10/22/13  3:52 AM      Result Value Ref Range   Prothrombin Time 26.4 (*) 11.6 - 15.2 seconds   INR 2.53 (*) 0.00 - 1.49  GLUCOSE, CAPILLARY     Status: Abnormal   Collection Time    10/22/13  8:03 AM      Result Value Ref Range   Glucose-Capillary 115 (*) 70 - 99 mg/dL  CBC     Status: Abnormal   Collection Time    10/22/13 10:00 AM      Result Value Ref Range   WBC 15.9 (*) 4.0 - 10.5 K/uL   RBC 4.87  3.87 - 5.11 MIL/uL   Hemoglobin 15.1 (*) 12.0 - 15.0 g/dL   HCT 44.3  36.0 - 46.0 %   MCV 91.0  78.0 - 100.0 fL   MCH 31.0  26.0 - 34.0 pg   MCHC 34.1  30.0 - 36.0 g/dL   RDW 14.0  11.5 - 15.5 %   Platelets 278  150 - 400 K/uL  BASIC METABOLIC PANEL     Status: Abnormal   Collection Time    10/22/13 10:00 AM      Result Value Ref Range   Sodium 132 (*) 137 - 147 mEq/L   Potassium 4.2  3.7 - 5.3 mEq/L   Chloride 92 (*) 96 - 112 mEq/L   CO2 24  19 - 32 mEq/L   Glucose, Bld 168 (*) 70 - 99 mg/dL   BUN 13  6 - 23 mg/dL   Creatinine, Ser 0.78  0.50 - 1.10 mg/dL   Calcium 9.1  8.4 - 10.5 mg/dL   GFR calc non Af Amer 73 (*) >90 mL/min   GFR calc Af Amer 85 (*) >90 mL/min  GLUCOSE, CAPILLARY     Status: Abnormal   Collection  Time    10/22/13 11:28 AM      Result Value Ref Range   Glucose-Capillary 200 (*) 70 - 99 mg/dL    Signed: Blain Pais, MD 10/22/2013, 8:02 PM   Time Spent on Discharge: 40 minutes Services Ordered on Discharge: None Equipment Ordered on Discharge: None

## 2013-10-22 NOTE — Progress Notes (Signed)
Utilization review completed. Shariyah Eland, RN, BSN. 

## 2013-10-22 NOTE — Progress Notes (Signed)
ANTICOAGULATION CONSULT NOTE - Follow Up Consult  Pharmacy Consult for Coumadin Indication: atrial fibrillation  Allergies  Allergen Reactions  . Elavil [Amitriptyline] Other (See Comments)    Felt drunk  . Nifedipine Other (See Comments)    Heart races  . Tegretol [Carbamazepine] Other (See Comments)    Felt drunk    Patient Measurements: Height: 5\' 2"  (157.5 cm) Weight: 141 lb 1.5 oz (64 kg) IBW/kg (Calculated) : 50.1  Vital Signs: Temp: 97.5 F (36.4 C) (03/19 0800) Temp src: Oral (03/19 0800) BP: 121/73 mmHg (03/19 1009) Pulse Rate: 93 (03/19 0800)  Labs:  Recent Labs  10/20/13 1240 10/20/13 2020 10/21/13 0107 10/21/13 0827 10/22/13 0352 10/22/13 1000  HGB 15.1*  --  13.4  --   --  15.1*  HCT 43.1  --  38.8  --   --  44.3  PLT 256  --  237  --   --  278  LABPROT 36.0*  --  34.1*  --  26.4*  --   INR 3.80*  --  3.54*  --  2.53*  --   CREATININE 0.97  --  0.83  --   --   --   TROPONINI  --  <0.30 <0.30 <0.30  --   --     Estimated Creatinine Clearance: 42 ml/min (by C-G formula based on Cr of 0.83).   Assessment: 80 y/o female on chronic Coumadin for Afib. INR is therapeutic at 2.53 today and rapidly decreasing. On antibiotics which can increase INR. No bleeding noted, CBC is normal.   Home dose is 2.5mg  daily except 5mg  on Sunday (confirmed with patient).  Goal of Therapy:  INR 2-3 Monitor platelets by anticoagulation protocol: Yes   Plan:  - Coumadin 5 mg PO tonight - INR daily - Monitor for s/sx of bleeding  [facility], Pharm.D., BCPS Clinical Pharmacist Pager: [phone] 10/22/2013 10:42 AM

## 2013-10-22 NOTE — Telephone Encounter (Signed)
LMOM for pt to call back for [facility] at ext [phone]

## 2013-10-22 NOTE — Progress Notes (Signed)
I have seen the patient and reviewed the daily progress note by Ardith Dark MS IV and discussed the care of the patient with them.  See below for documentation of my findings, assessment, and plans.  Subjective: Patient seen and examined this AM.  States that she is feeling better. Denies chest pain, heart palpitations, SOB, or cough.   Objective: Vital signs in last 24 hours: Filed Vitals:   10/22/13 0842 10/22/13 1009 10/22/13 1115 10/22/13 1443  BP:  121/73 123/72   Pulse:   92   Temp:   97.5 F (36.4 C)   TempSrc:   Oral   Resp:   22   Height:      Weight:      SpO2: 94%  95% 91%   Weight change: -2 lb 14.5 oz (-1.318 kg)  Intake/Output Summary (Last 24 hours) at 10/22/13 1952 Last data filed at 10/22/13 1300  Gross per 24 hour  Intake      3 ml  Output   1950 ml  Net  -1947 ml   Vitals reviewed. General: Sitting up in bed, in NAD HEENT: no scleral icterus, wearing her glasses, MMM Cardiac: RRR, no rubs, murmurs or gallops Pulm: crackles at the left lung base, no wheezes or rhonchi Abd: soft, nontender, nondistended, BS present Ext: warm and well perfused, no pedal edema Neuro: alert and oriented X3, no focal Neurological deficits, moves all extremities voluntarily  Lab Results: Reviewed and documented in Electronic Record Micro Results: Reviewed and documented in Electronic Record Studies/Results: Reviewed and documented in Electronic Record Medications: I have reviewed the patient's current medications. Scheduled Meds: . atorvastatin  80 mg Oral QPM  . azithromycin  500 mg Oral Q24H  . cefTRIAXone (ROCEPHIN)  IV  1 g Intravenous Q24H  . cilostazol  100 mg Oral BID  . diltiazem  240 mg Oral Daily  . ipratropium-albuterol  3 mL Nebulization TID  . isosorbide mononitrate  30 mg Oral QAC breakfast  . metoprolol succinate  100 mg Oral BID  . pantoprazole  40 mg Oral Daily  . ramipril  10 mg Oral Daily  . sodium chloride  3 mL Intravenous Q12H  . warfarin   5 mg Oral ONCE-1800  . Warfarin - Pharmacist Dosing Inpatient   Does not apply q1800   Continuous Infusions:  PRN Meds:.albuterol, docusate sodium, fluticasone, guaiFENesin, ondansetron (ZOFRAN) IV, ondansetron Assessment/Plan: Cheyenne [name] is a 80 y.o. female with PMH paroxysmal atrial fibrillation with RVR on Coumadin, CAD (s/p inferior MI with PTCA of RCA and CABG in 1997, normal follow-up NST with EF 73% on 01/2013), HTN, HLD, GERD who presented to the ED with a chief complaint of tachycardia, with productive cough and new oxygen requirement.   Hypoxia, likely 2/2 CAP - Improving. On presentation she had 1 week of productive cough and chills, with CXR showing infiltrate concerning for CAP. Elevated A-A gradient (50) on ABG may represent PNA, though evaluation for underlying pulmonary conditions (such as ILD) may be considered in the outpatient setting after resolution of PNA. She ambulate with no O2 supplementation and saturation to 91% on RA, indicating further improvement of her hypoxia.   - Continue ceftriaxone and azithromycin IV, will transition to azithromycin 500mg  daily for 7 days on discharge - urine strep, urine legionella, and flu panel negative  - Oxygen therapy prn to keep O2 saturation greater then 90%  - Continuous pulse oximetry  - Zofran when necessary for nausea  - PT eval and treat -->  no need for [facility] PT  #Atrial fibrillation with episodes of RVR - Patient has a known history of paroxysmal A. fib with episodes of RVR. She is rate controlled only. -TSH mildly low, free T4 normal (will need repeat at f/u once PNA treated) - Continue home diltiazem 240 mg daily  - Continue home Toprol-XL 100 mg twice daily (at home she takes 50mg  for pm dose on odd days)   #Supratherapeutic INR on Coumadin  - Coumadin per pharmacy   #Diffuse interstitial markings - Noted on prior CXR 10/14, as well as today. She has diaphragm flattening consistent with emphysema. Findings are suggestive  underlying component of pulmonary fibrosis. She was previously on Amiodarone, ?toxicity.  - Consider outpatient chest CT after resolution of CAP   #COPD - Patient has a history of COPD with hyperinflation and diaphragmatic flattening on chest x-ray.  - Duonebs every 6 hours as needed   #CAD - Continue Pletal 100mg  daily   #GERD - protonic 40 mg daily   #HTN - currently normotensive.  - Continue on ramipril 10 mg daily   #History of hypokalemia - 4.2 here. Takes K-Dur at home.  - Continue to monitor and replete as needed   #DM2 - Diet controlled at home.  - CBG AC and QHS   #Diet - Carb modified   #DVT PPX - Coumadin per pharmacy  Dispo: Disposition is deferred at this time, awaiting improvement of current medical problems.  Anticipated discharge is today.   The patient does have a current PCP Hayden Rasmussen, MD) and does not need an [facility] hospital follow-up appointment after discharge.  The patient does not have transportation limitations that hinder transportation to clinic appointments.  .Services Needed at time of discharge: Y = Yes, Blank = No PT:   OT:   RN:   Equipment:   Other:     LOS: 2 days   Blain Pais, MD 10/22/2013, 7:52 PM

## 2013-10-22 NOTE — Discharge Instructions (Signed)
You were seen at [facility] from 3/18-3/19 for Cheyenne [name] treatment of Community Acquired Pneumonia.  You required oxygen therapy during your stay due to your shortness of breath.  All of your heart medications will stay the same.  You will need to continue taking your antibiotics (Azithromycin) for 7 days. Please return to the hospital if your shortness of breath gets any worse, or if you have any new palpitations, chest pain, or fevers.  Here are your scheduled appointments:  Cheyenne Cheyenne [name]  - On 10/30/2013 Time: 11:40am  [address] [place] [address] [phone]  [facility] On 10/30/2013 Coumadin check -- Time: 1:30pm  [address] [place] [address] [phone]  Cheyenne Cheyenne [name] On 12/04/2013 Time: 11:45 am  [place] [address] [phone]  Pneumonia, Adult Pneumonia is an infection of the lungs.  CAUSES Pneumonia may be caused by bacteria or Cheyenne [name] virus. Usually, these infections are caused by breathing infectious particles into the lungs (respiratory tract). SYMPTOMS   Cough.  Fever.  Chest pain.  Increased rate of breathing.  Wheezing.  Mucus production. DIAGNOSIS  If you have the common symptoms of pneumonia, your caregiver will typically confirm the diagnosis with Cheyenne [name] chest X-ray. The X-ray will show an abnormality in the lung (pulmonary infiltrate) if you have pneumonia. Other tests of your blood, urine, or sputum may be done to find the specific cause of your pneumonia. Your caregiver may also do tests (blood gases or pulse oximetry) to see how well your lungs are working. TREATMENT  Some forms of pneumonia may be spread to other people when you cough or sneeze. You may be asked to wear Cheyenne [name] mask before and during your exam. Pneumonia that is caused by bacteria is treated with antibiotic medicine. Pneumonia that is caused by the influenza virus may be treated with an antiviral medicine.  Most other viral infections must run their course. These infections will not respond to antibiotics.  PREVENTION Cheyenne [name] pneumococcal shot (vaccine) is available to prevent Cheyenne [name] common bacterial cause of pneumonia. This is usually suggested for:  People over 32 years old.  Patients on chemotherapy.  People with chronic lung problems, such as bronchitis or emphysema.  People with immune system problems. If you are over 65 or have Cheyenne [name] high risk condition, you may receive the pneumococcal vaccine if you have not received it before. In some countries, Cheyenne [name] routine influenza vaccine is also recommended. This vaccine can help prevent some cases of pneumonia.You may be offered the influenza vaccine as part of your care. If you smoke, it is time to quit. You may receive instructions on how to stop smoking. Your caregiver can provide medicines and counseling to help you quit. HOME CARE INSTRUCTIONS   Cough suppressants may be used if you are losing too much rest. However, coughing protects you by clearing your lungs. You should avoid using cough suppressants if you can.  Your caregiver may have prescribed medicine if he or she thinks your pneumonia is caused by Cheyenne [name] bacteria or influenza. Finish your medicine even if you start to feel better.  Your caregiver may also prescribe an expectorant. This loosens the mucus to be coughed up.  Only take over-the-counter or prescription medicines for pain, discomfort, or fever as directed by your caregiver.  Do not smoke. Smoking is Cheyenne [name] common cause of bronchitis and can contribute to pneumonia. If you are Cheyenne [name] smoker and continue to smoke, your cough may last several  weeks after your pneumonia has cleared.  Cheyenne [name] cold steam vaporizer or humidifier in your room or home may help loosen mucus.  Coughing is often worse at night. Sleeping in Cheyenne [name] semi-upright position in Cheyenne [name] recliner or using Cheyenne [name] couple pillows under your head will help with this.  Get rest as you feel it is needed. Your body  will usually let you know when you need to rest. SEEK IMMEDIATE MEDICAL CARE IF:   Your illness becomes worse. This is especially true if you are elderly or weakened from any other disease.  You cannot control your cough with suppressants and are losing sleep.  You begin coughing up blood.  You develop pain which is getting worse or is uncontrolled with medicines.  You have Cheyenne [name] fever.  Any of the symptoms which initially brought you in for treatment are getting worse rather than better.  You develop shortness of breath or chest pain. MAKE SURE YOU:   Understand these instructions.  Will watch your condition.  Will get help right away if you are not doing well or get worse. Document Released: 07/23/2005 Document Revised: 10/15/2011 Document Reviewed: 10/12/2010 [facility] Patient Information 2014 [place], [place].

## 2013-10-22 NOTE — Progress Notes (Signed)
amb with 2 liters and 02 sat 94%. amb off o2 and sat down to 91% per Horris Latino, tech.

## 2013-10-22 NOTE — Progress Notes (Signed)
Subjective: Ms. Troost reports that she is feeling better this morning.  She would like to be discharged as she is not comfortable in the hospital bed.  She says she does not feel any more short of breath than she does at home.  She denies any palpations or chest pain.  She has not had a bowel movement since admission.  Objective: Vital signs in last 24 hours: Filed Vitals:   10/22/13 0800 10/22/13 0842 10/22/13 1009 10/22/13 1115  BP: 122/71  121/73 123/72  Pulse: 93   92  Temp: 97.5 F (36.4 C)   97.5 F (36.4 C)  TempSrc: Oral   Oral  Resp: 21   22  Height:      Weight:      SpO2: 93% 94%  95%   Weight change: -1.318 kg (-2 lb 14.5 oz)  Intake/Output Summary (Last 24 hours) at 10/22/13 1118 Last data filed at 10/22/13 1116  Gross per 24 hour  Intake      3 ml  Output   1950 ml  Net  -1947 ml   BP 123/72  Pulse 92  Temp(Src) 97.5 F (36.4 C) (Oral)  Resp 22  Ht 5\' 2"  (1.575 m)  Wt 64 kg (141 lb 1.5 oz)  BMI 25.80 kg/m2  SpO2 95% General: alert, cooperative, and in no apparent distress HEENT: pupils equal round and reactive to light, vision grossly intact, oropharynx clear and non-erythematous  Neck: supple Lungs: normal work of respiration, crackles at left lung base, decreased saturation to 84% while talking Heart: irregularly irregular rhythm, regular rate, no m/g/r Abdomen: soft, non-tender, non-distended, normal bowel sounds  Extremities: no cyanosis, clubbing, or edema Neurologic: alert & oriented X3, cranial nerves II-XII intact, strength grossly intact, sensation intact to light touch  Lab Results: Results for orders placed during the hospital encounter of 10/20/13 (from the past 24 hour(s))  T4, FREE     Status: None   Collection Time    10/21/13 12:53 PM      Result Value Ref Range   Free T4 1.77  0.80 - 1.80 ng/dL  GLUCOSE, CAPILLARY     Status: None   Collection Time    10/21/13  1:08 PM      Result Value Ref Range   Glucose-Capillary 98  70 - 99  mg/dL   Comment 1 Notify RN    GLUCOSE, CAPILLARY     Status: Abnormal   Collection Time    10/21/13  4:58 PM      Result Value Ref Range   Glucose-Capillary 163 (*) 70 - 99 mg/dL  GLUCOSE, CAPILLARY     Status: Abnormal   Collection Time    10/21/13 11:12 PM      Result Value Ref Range   Glucose-Capillary 127 (*) 70 - 99 mg/dL   Comment 1 Notify RN    PROTIME-INR     Status: Abnormal   Collection Time    10/22/13  3:52 AM      Result Value Ref Range   Prothrombin Time 26.4 (*) 11.6 - 15.2 seconds   INR 2.53 (*) 0.00 - 1.49  GLUCOSE, CAPILLARY     Status: Abnormal   Collection Time    10/22/13  8:03 AM      Result Value Ref Range   Glucose-Capillary 115 (*) 70 - 99 mg/dL  CBC     Status: Abnormal   Collection Time    10/22/13 10:00 AM      Result Value  Ref Range   WBC 15.9 (*) 4.0 - 10.5 K/uL   RBC 4.87  3.87 - 5.11 MIL/uL   Hemoglobin 15.1 (*) 12.0 - 15.0 g/dL   HCT 44.3  36.0 - 46.0 %   MCV 91.0  78.0 - 100.0 fL   MCH 31.0  26.0 - 34.0 pg   MCHC 34.1  30.0 - 36.0 g/dL   RDW 14.0  11.5 - 15.5 %   Platelets 278  150 - 400 K/uL  BASIC METABOLIC PANEL     Status: Abnormal   Collection Time    10/22/13 10:00 AM      Result Value Ref Range   Sodium 132 (*) 137 - 147 mEq/L   Potassium 4.2  3.7 - 5.3 mEq/L   Chloride 92 (*) 96 - 112 mEq/L   CO2 24  19 - 32 mEq/L   Glucose, Bld 168 (*) 70 - 99 mg/dL   BUN 13  6 - 23 mg/dL   Creatinine, Ser 0.78  0.50 - 1.10 mg/dL   Calcium 9.1  8.4 - 10.5 mg/dL   GFR calc non Af Amer 73 (*) >90 mL/min   GFR calc Af Amer 85 (*) >90 mL/min    Micro Results: Recent Results (from the past 240 hour(s))  MRSA PCR SCREENING     Status: None   Collection Time    10/20/13  7:15 PM      Result Value Ref Range Status   MRSA by PCR NEGATIVE  NEGATIVE Final   Comment:            The GeneXpert MRSA Assay (FDA     approved for NASAL specimens     only), is one component of a     comprehensive MRSA colonization     surveillance program. It  is not     intended to diagnose MRSA     infection nor to guide or     monitor treatment for     MRSA infections.  CULTURE, BLOOD (ROUTINE X 2)     Status: None   Collection Time    10/20/13  8:20 PM      Result Value Ref Range Status   Specimen Description BLOOD LEFT ARM   Final   Special Requests BOTTLES DRAWN AEROBIC ONLY 10CC   Final   Culture  Setup Time     Final   Value: 10/21/2013 01:02     Performed at [facility]   Culture     Final   Value:        BLOOD CULTURE RECEIVED NO GROWTH TO DATE CULTURE WILL BE HELD FOR 5 DAYS BEFORE ISSUING A FINAL NEGATIVE REPORT     Performed at [facility]   Report Status PENDING   Incomplete  CULTURE, BLOOD (ROUTINE X 2)     Status: None   Collection Time    10/20/13  8:40 PM      Result Value Ref Range Status   Specimen Description BLOOD LEFT HAND   Final   Special Requests BOTTLES DRAWN AEROBIC ONLY 5CC   Final   Culture  Setup Time     Final   Value: 10/21/2013 01:03     Performed at [facility]   Culture     Final   Value:        BLOOD CULTURE RECEIVED NO GROWTH TO DATE CULTURE WILL BE HELD FOR 5 DAYS BEFORE ISSUING A FINAL NEGATIVE REPORT  Performed at [facility]   Report Status PENDING   Incomplete   Studies/Results: Dg Chest 2 View  10/20/2013   CLINICAL DATA:  IRREGULAR HEART BEAT  EXAM: CHEST  2 VIEW  COMPARISON:  DG CHEST 1V PORT dated 05/12/2013  FINDINGS: There is flattening of the hemidiaphragms and increased AP diameter of the chest. Patient is status post mediastinum and coronary artery bypass grafting. Cardiac silhouette is enlarged. Atherosclerotic calcifications identified within the aorta. There is prominence of the interstitial markings and areas of mild peribronchial cuffing. Areas increased density projects within the right left lung bases. The osseous structures demonstrate no evidence of acute abnormalities.  IMPRESSION: COPD. Differential consideration of interstitial findings  are component of pulmonary edema as well as a underlying component of pulmonary fibrosis. An infectious or inflammatory infiltrate within the lung bases cannot be excluded clinically appropriate. Surveillance evaluation recommended.   Electronically Signed   By: Margaree Mackintosh M.D.   On: 10/20/2013 13:53   Medications: I have reviewed the patient's current medications. Scheduled Meds: . atorvastatin  80 mg Oral QPM  . azithromycin  500 mg Oral Q24H  . cefTRIAXone (ROCEPHIN)  IV  1 g Intravenous Q24H  . cilostazol  100 mg Oral BID  . diltiazem  240 mg Oral Daily  . ipratropium-albuterol  3 mL Nebulization TID  . isosorbide mononitrate  30 mg Oral QAC breakfast  . metoprolol succinate  100 mg Oral BID  . pantoprazole  40 mg Oral Daily  . ramipril  10 mg Oral Daily  . sodium chloride  3 mL Intravenous Q12H  . warfarin  5 mg Oral ONCE-1800  . Warfarin - Pharmacist Dosing Inpatient   Does not apply q1800   Continuous Infusions:  PRN Meds:.albuterol, fluticasone, guaiFENesin, ondansetron (ZOFRAN) IV, ondansetron Assessment/Plan: Active Problems:   DIABETES MELLITUS   COPD   CAD (coronary artery disease)   Atrial flutter   CAP (community acquired pneumonia)   Acute respiratory failure with hypoxia  Cheyenne [name] is a 80 y.o. female with PMH paroxysmal atrial fibrillation with RVR on Coumadin, CAD (s/p inferior MI with PTCA of RCA and CABG in 1997, normal follow-up NST with EF 73% on 01/2013), HTN, HLD, GERD who presented to the ED with a chief complaint of tachycardia, with productive cough and new oxygen requirement.   #Hypoxia, likely 2/2 CAP - The patient presents\ed with 1 week of productive cough and chills, with CXR showing infiltrate concerning for CAP. Elevated A-A gradient on ABG may represent PNA, though evaluation for underlying pulmonary conditions (such as ILD) may be considered in the outpatient setting after resolution of PNA. Blood cultures NGTD.  Urine cultures no growth.   Urine strep, urine legionella, and flu panel negative. - Continue ceftriaxone and azithromycin IV.  Will discharge her on Azithromycin - Oxygen therapy PRN to keep O2 saturation greater then 90%  - Continuous pulse oximetry  - Zofran when necessary for nausea  - Will plan to walk with the nurse to measure hypoxemia on exertion.  If she continues to desaturate <88%, will need home oxygen therapy.  The home oxygen requirement is likely due to both the current pneumonia and the underlying COPD (seen on CXR) and possible interstitial lung disease.  She will need to have further work up as an outpatient. - Nurse walked, and the patient saturated at 91% during ambulation.  She will need close hospital follow-up.  #Atrial fibrillation with episodes of RVR - Patient has a known history of  paroxysmal A. fib with episodes of RVR. She is rate controlled only. TSH mildly low, normal free T4. Discussed her care with her cardiologist, Dr. Claiborne Billings. Asked about the discontinuation of Amiodarone - this was due to the primary decision to treat her Afib with rate control to avoid any complications of anti-arrythmics. He did not suspect pulmonary fibrosis at the time he discontinued Amiodarone. Likely trigger is community acquired pneumonia, as above. Troponins negative x3.  - Continue home Diltiazem 240 mg daily  - Continue home Toprol-XL 100 mg twice daily (at home she takes 50mg  for pm dose on odd days)   #Supratherapeutic INR on Coumadin  - Coumadin per pharmacy   #Diffuse interstitial markings - Noted on prior CXR 10/14, as well as today. She has diaphragm flattening consistent with emphysema. Findings are suggestive underlying component of pulmonary fibrosis. She was previously on Amiodarone, ?toxicity.  - Consider outpatient chest CT after resolution of CAP   #COPD - Patient has a history of COPD with hyperinflation and diaphragmatic flattening on chest x-ray.  - Duonebs every 6 hours as needed   #CAD -  Continue Pletal 100mg  daily   #GERD - Protonix 40 mg daily  #HTN - currently normotensive.  - Continue on ramipril 10 mg daily   #History of hypokalemia - 3.7 on admission.  4.2 today. Takes K-Dur at home.  - Continue to monitor and replete as needed   #DM2 - Diet controlled at home.  - CBG AC and QHS   #Diet - Carb modified   #DVT PPX - Coumadin per pharmacy  This is a Careers information officer Note.  The care of the patient was discussed with Dr. Randell Loop and the assessment and plan formulated with their assistance.  Please see their attached note for official documentation of the daily encounter.   LOS: 2 days   Judy Pimple, Med Student 10/22/2013, 11:18 AM

## 2013-10-26 NOTE — Discharge Summary (Signed)
  Date: 10/26/2013  Patient name: Cheyenne [name]  Medical record number: [id number]  Date of birth: [birth year]   This patient has been seen and the plan of care was discussed with the house staff. Please see their note for complete details. I concur with their findings and plan.  Dominic Pea, DO, [facility] 10/26/2013, 11:23 AM

## 2013-10-27 ENCOUNTER — Telehealth: Payer: Self-pay | Admitting: *Deleted

## 2013-10-27 DIAGNOSIS — I739 Peripheral vascular disease, unspecified: Secondary | ICD-10-CM

## 2013-10-27 LAB — CULTURE, BLOOD (ROUTINE X 2)
Culture: NO GROWTH
Culture: NO GROWTH

## 2013-10-27 NOTE — Telephone Encounter (Signed)
Order placed for repeat lower extremity arterial doppler in 1 year  

## 2013-10-27 NOTE — Telephone Encounter (Signed)
Message copied by Chauncy Lean on Tue Oct 27, 2013 10:58 AM ------      Message from: Lorretta Harp      Created: Mon Oct 26, 2013  6:45 AM       Bilateral progression of disease. If asymptomatic repeat in 12 months ------

## 2013-10-28 ENCOUNTER — Ambulatory Visit: Payer: Medicare Other | Admitting: Pharmacist Clinician (PhC)/ Clinical Pharmacy Specialist

## 2013-10-30 ENCOUNTER — Ambulatory Visit: Payer: Medicare Other | Admitting: Pharmacist Clinician (PhC)/ Clinical Pharmacy Specialist

## 2013-11-02 ENCOUNTER — Other Ambulatory Visit: Payer: Self-pay | Admitting: Pharmacist Clinician (PhC)/ Clinical Pharmacy Specialist

## 2013-11-02 ENCOUNTER — Ambulatory Visit (INDEPENDENT_AMBULATORY_CARE_PROVIDER_SITE_OTHER): Payer: Medicare Other | Admitting: Pharmacist Clinician (PhC)/ Clinical Pharmacy Specialist

## 2013-11-02 VITALS — BP 122/62 | HR 114

## 2013-11-02 DIAGNOSIS — Z7901 Long term (current) use of anticoagulants: Secondary | ICD-10-CM

## 2013-11-02 DIAGNOSIS — I4891 Unspecified atrial fibrillation: Secondary | ICD-10-CM

## 2013-11-02 LAB — POCT INR: INR: 3.2

## 2013-11-02 MED ORDER — ISOSORBIDE MONONITRATE ER 30 MG PO TB24
30.0000 mg | ORAL_TABLET | Freq: Every day | ORAL | Status: DC
Start: 2013-11-02 — End: 2014-12-22

## 2013-11-02 MED ORDER — RAMIPRIL 10 MG PO CAPS: 10.0000 mg | ORAL_CAPSULE | Freq: Every day | ORAL | Status: DC

## 2013-11-02 MED ORDER — EZETIMIBE 10 MG PO TABS: 10.0000 mg | ORAL_TABLET | Freq: Every evening | ORAL | Status: DC

## 2013-11-02 MED ORDER — ATORVASTATIN CALCIUM 80 MG PO TABS: 80.0000 mg | ORAL_TABLET | Freq: Every evening | ORAL | Status: DC

## 2013-11-09 ENCOUNTER — Ambulatory Visit: Payer: Medicare Other | Admitting: Pharmacist Clinician (PhC)/ Clinical Pharmacy Specialist

## 2013-11-16 ENCOUNTER — Ambulatory Visit (INDEPENDENT_AMBULATORY_CARE_PROVIDER_SITE_OTHER): Payer: Medicare Other | Admitting: Pharmacist Clinician (PhC)/ Clinical Pharmacy Specialist

## 2013-11-16 VITALS — BP 140/72 | HR 88

## 2013-11-16 DIAGNOSIS — I4891 Unspecified atrial fibrillation: Secondary | ICD-10-CM

## 2013-11-16 DIAGNOSIS — Z7901 Long term (current) use of anticoagulants: Secondary | ICD-10-CM

## 2013-11-16 LAB — POCT INR: INR: 2.8

## 2013-12-04 ENCOUNTER — Ambulatory Visit (INDEPENDENT_AMBULATORY_CARE_PROVIDER_SITE_OTHER): Payer: Medicare Other | Admitting: Cardiovascular Disease

## 2013-12-04 ENCOUNTER — Ambulatory Visit (INDEPENDENT_AMBULATORY_CARE_PROVIDER_SITE_OTHER): Payer: Medicare Other | Admitting: Pharmacist Clinician (PhC)/ Clinical Pharmacy Specialist

## 2013-12-04 ENCOUNTER — Encounter: Payer: Self-pay | Admitting: Cardiovascular Disease

## 2013-12-04 VITALS — BP 118/70 | HR 87 | Ht 62.0 in | Wt 138.6 lb

## 2013-12-04 DIAGNOSIS — I4891 Unspecified atrial fibrillation: Secondary | ICD-10-CM

## 2013-12-04 DIAGNOSIS — E785 Hyperlipidemia, unspecified: Secondary | ICD-10-CM

## 2013-12-04 DIAGNOSIS — I2510 Atherosclerotic heart disease of native coronary artery without angina pectoris: Secondary | ICD-10-CM

## 2013-12-04 DIAGNOSIS — Z7901 Long term (current) use of anticoagulants: Secondary | ICD-10-CM

## 2013-12-04 DIAGNOSIS — I10 Essential (primary) hypertension: Secondary | ICD-10-CM

## 2013-12-04 LAB — POCT INR: INR: 3

## 2013-12-04 NOTE — Patient Instructions (Signed)
Dr Claiborne Billings has recommended making the following medication changes:  INCREASE TOPROL-XL to 100 mg twice daily (If your heart rate goes below 60, take 1/2 tablet in the evening)  Dr Colan Neptune wants you to follow-up in 4 months. You will receive a reminder letter in the mail one months in advance. If you don't receive a letter, please call our office to schedule the follow-up appointment.

## 2013-12-13 DIAGNOSIS — I4891 Unspecified atrial fibrillation: Secondary | ICD-10-CM | POA: Insufficient documentation

## 2013-12-13 NOTE — Progress Notes (Signed)
Patient ID: De Nurse, female   DOB: [birth year], 80 y.o.   MRN: [id number]      HPI: Cheyenne [name] is a 80 y.o. female who presents to the office today for a 4 month cardiology evaluation.  Cheyenne [name] has known coronary artery disease. She suffered an inferior wall myocardial infarction in 1997 treated with PTCA of her RCA and ultimately underwent CABG surgery. She has a remote history of atrial flutter and is status post atrial flutter ablation. She also is a history of atrial fibrillation which is now permanent. . She was hospitalized with PAF in July 2013. She also has a history of hyperlipidemia, hypertension, as well as GERD  On 01/29/2013 a 2 year follow-up nuclear study continued to show normal perfusion. Post-stress ejection fraction was 73%. There was no evidence for scar or ischemia.  On 05/12/2013, Cheyenne [name] apparently developed another episode of atrial fibrillation with rapid ventricular response leading to her [facility] hospital evaluation. She has been on chronic anticoagulation. She had taken additional doses of Lopressor prior to her evaluation. Her amiodarone dose was adjusted and ultimately she converted to sinus rhythm.however, she has developed recurrent atrial fibrillation is and now is in permanent atrial fibrillation.  Since I last saw her, she tells her she was hospitalized in March with pneumonia.  Typically, her heart rate is in the 80s to 90s, but on rare occurrence.  Her heart rate has increased.  She also had an irritable bowel syndrome flare up.  Presently, she denies chest pain.  She denies palpitations.  She denies dyspnea.  He denies bleeding.  Past Medical History  Diagnosis Date  . Atrial fibrillation   . Coronary artery disease   . COPD (chronic obstructive pulmonary disease)   . Hypertension 11/04/2009    renal doppler showed abd aorta normal size  low velocities  w/ moderate amt plaque; rgt & lft kidney  normal size,renal arteries normal patency  . Anxiety   .  Pneumonia     5-6 years ago  . Arthritis   . Myocardial infarction     Mild MI 1987,inferior wall  . Paroxysmal atrial fibrillation     last hospitalized July 2013  . Hyperlipidemia   . PVD (peripheral vascular disease) 09/18/2011    carotid doppler showed mild amount of fibrous plaque both carotid arteries w/o significant flow reduction or stenosis  . PVD (peripheral vascular disease) 09/18/2011    Lower extremity doppler showed ABIof 0.97 on the right,0.83 on the left.,50-60%narrowing rgt common iliac greater than 70% right SFA, right posterior tibial & peroneal arteries occlude  . PVD (peripheral vascular disease) 09/14/2010    ABI's in 2012 were 0.94 and 0.81 but velocities appeared more sigificantly changed  . PVD (peripheral vascular disease) with claudication 09/15/2007    abd aotra moder. amt plaque w/low velocities, left SFA occluded vessel, rgt prox. & mid SFA elvated  velocities ABI rgt O.87,ABI  lft 0.77   . Peripheral angiopathy 03/21/2004    total left SFA w/three vessel runoff though tibial vessels are diffusely disease tx medically,left 70% distal lft external iliac,70% rgt common iliac, 50-60% prox and mid rgt SFA ,  . Diabetes mellitus without complication     Past Surgical History  Procedure Laterality Date  . Nasal sinus surgery    . Quadruple  bypass    . Cholecystectomy    . Eye surgery      bilateral catracts  . Coronary angioplasty  1987    emergent,60% LAD  .  Coronary artery bypass graft  03/23/1998    CABGx4 left internal mammary artery to LAD, SVG to diagonal branch to LAD, first obtuse branch Left Circ,and posterior descending branch of RCA  . Doppler echocardiography  05/29/2012    proximal septal thickening w/normal systolic and probable grade 2 diastolic dysfunction,mod LA dilatation, mild  annilar calcifcation w/mild to mod MR, mild to mod.TR , mild pulm. HTN w/upper normal pulm. pressures. mild aortic sclerosis w/o stenosis and pulm.valvular  regrurgitation  . Doppler echocardiography  04/16/2011    EF =>55%, LV normal,Grade II diastolic dysfunction, aortic valve moderately sclerotic  . Doppler echocardiography  06/16/2009    EF => 55%,LV normal ,mild to moder. mitral annular calcifcation,no aortic stenosis, aortic valve appears moder.sclerotic  . Cardiac stress test  11/09/2010    EF 75%, small area of reversible lateral ischemia. SDS 2 . Extent 3%  . Cardiac stress test  11/04/2009    EF 75%, LV normal ,   . Cardiac catheterization  03/22/1998    severe four vessel coronary disease normal LV function  . Atrial flutter ablation  11/11/1996    Dr Caryl Comes did RFablation atrial flutter  and sinus node re-entry tachycardia  . Cardioversion N/A 06/19/2013    Procedure: CARDIOVERSION;  Surgeon: Troy Sine, MD;  Location[facility] ENDOSCOPY;  Service: Cardiovascular;  Laterality: N/A;    Allergies  Allergen Reactions  . Cephalexin Diarrhea  . Elavil [Amitriptyline] Other (See Comments)    Felt drunk  . Nifedipine Other (See Comments)    Heart races  . Tegretol [Carbamazepine] Other (See Comments)    Felt drunk    Current Outpatient Prescriptions  Medication Sig Dispense Refill  . acetaminophen (TYLENOL) 500 MG tablet Take 1,000 mg by mouth every 6 (six) hours as needed. For pain      . ALPRAZolam (XANAX) 0.25 MG tablet Take 0.5 mg by mouth at bedtime. For anxiety      . atorvastatin (LIPITOR) 80 MG tablet Take 1 tablet (80 mg total) by mouth every evening.  90 tablet  3  . calcium carbonate (OS-CAL) 600 MG TABS Take 600 mg by mouth 2 (two) times daily with a meal.      . cilostazol (PLETAL) 100 MG tablet Take 1 tablet (100 mg total) by mouth 2 (two) times daily.  180 tablet  3  . dicyclomine (BENTYL) 10 MG capsule Take 10 mg by mouth daily as needed for spasms.       Marland Kitchen diltiazem (CARDIZEM CD) 120 MG 24 hr capsule Take 240 mg by mouth daily.       Mariane Baumgarten Sodium (DSS) 100 MG CAPS Take 100 mg by mouth 2 (two) times daily.  60  each  2  . esomeprazole (NEXIUM) 40 MG capsule Take 40 mg by mouth daily before breakfast.      . ezetimibe (ZETIA) 10 MG tablet Take 1 tablet (10 mg total) by mouth every evening.  90 tablet  3  . fluticasone (FLONASE) 50 MCG/ACT nasal spray Place 2 sprays into the nose daily as needed for allergies.       . furosemide (LASIX) 20 MG tablet Take 20 mg by mouth daily.       Marland Kitchen HYDROcodone-acetaminophen (NORCO/VICODIN) 5-325 MG per tablet Take 1 tablet by mouth every 6 (six) hours as needed. For pain      . isosorbide mononitrate (IMDUR) 30 MG 24 hr tablet Take 1 tablet (30 mg total) by mouth daily before breakfast.  90 tablet  3  . Levocetirizine Dihydrochloride (XYZAL PO) Take 10 mg by mouth every evening.      . metoprolol succinate (TOPROL-XL) 100 MG 24 hr tablet On even days take 100 mg twice daily. On odd days take 1 tablet in the AM and 1/2 tablet in the PM.  135 tablet  3  . Omega-3 Fatty Acids (FISH OIL) 1200 MG CAPS Take 1 capsule by mouth 2 (two) times daily.      . potassium chloride SA (K-DUR,KLOR-CON) 20 MEQ tablet Take 20 mEq by mouth 2 (two) times daily.       . psyllium (METAMUCIL) 58.6 % powder Take 1 packet by mouth daily. At lunch or supper      . ramipril (ALTACE) 10 MG capsule Take 1 capsule (10 mg total) by mouth daily.  90 capsule  3  . Tiotropium Bromide Monohydrate (SPIRIVA HANDIHALER IN) Inhale 1 capsule into the lungs daily.       Marland Kitchen warfarin (COUMADIN) 5 MG tablet Take 2.5-5 mg by mouth daily at 3 pm. Take one tablet (5mg ) on Sundays. All other days take one half-tablet (2.5mg ).       No current facility-administered medications for this visit.    Socially she is widowed for 4 years. She has 3 children and one grandchild. She has a remote tobacco history but quit in 1999. There is no alcohol use. She was born in [place]. She moved to [place] in [place] after [place]: Negative; No fevers, chills, or night sweats; no weight change HEENT: Negative; No changes  in vision or hearing, sinus congestion, difficulty swallowing Pulmonary: Recent hospitalization with pneumonia No cough, wheezing, shortness of breath, hemoptysis Cardiovascular: Negative; No chest pain, presyncope, syncope, palpatations GI: Recent irritable bowel syndrome flare up; Currently no nausea, vomiting, diarrhea, or abdominal pain GU: Negative; No dysuria, hematuria, or difficulty voiding Musculoskeletal: Intermittent knee discomfort; no myalgias,  or weakness Hematologic/Oncology: Negative; no easy bruising, bleeding Endocrine: Negative; no heat/cold intolerance; she has a history of glucose intolerance Neuro: Negative; no changes in balance, headaches Skin: Negative; No rashes or skin lesions Psychiatric: Negative; No behavioral problems, depression Sleep: Negative; No snoring, daytime sleepiness, hypersomnolence, bruxism, restless legs, hypnogognic hallucinations, no cataplexy Other comprehensive 14 point system review is negative.  PE BP 118/70  Pulse 87  Ht 5\' 2"  (1.575 m)  Wt 138 lb 9.6 oz (62.869 kg)  BMI 25.34 kg/m2  General: Alert, oriented, no distress.  Skin: normal turgor, blister or lesion on the left forearm with mild induration below this. HEENT: Normocephalic, atraumatic. Pupils round and reactive; sclera anicteric;no lid lag.  Nose without nasal septal hypertrophy Mouth/Parynx benign; Mallinpatti scale 3 Neck: No JVD, no carotid bruits; normal carotid upstroke Chest wall: No tenderness to palpation Lungs: clear to ausculatation and percussion; no wheezing or rales Heart: Irregularly irregular rhythm with a ventricular rate in the 80's;  s1 s2 normal 1/6 systolic murmur; no diastolic murmur Abdomen: soft, nontender; no hepatosplenomehaly, BS+; abdominal aorta nontender and not dilated by palpation. Back: No CVA tenderness Pulses 2+ Extremities: no clubbing cyanosis or edema, Homan's sign negative  Neurologic: grossly nonfocal Psychological: Normal affect  and mood. Normal cognition.  ECG (independently read by me): Atrial fibrillation at 87 beats per minute.  Nonspecific ST-T changes.   Prior January 2015 ECG (independently read by me): Atrial fibrillation with ventricular response of 87. QTc interval 495 ms. when compared to her prior ECG atrial fibrillation rate has increased to 87 from 73 average  rate.  LABS:  BMET    Component Value Date/Time   NA 132* 10/22/2013 1000   K 4.2 10/22/2013 1000   CL 92* 10/22/2013 1000   CO2 24 10/22/2013 1000   GLUCOSE 168* 10/22/2013 1000   BUN 13 10/22/2013 1000   CREATININE 0.78 10/22/2013 1000   CREATININE 1.27* 06/15/2013 1031   CALCIUM 9.1 10/22/2013 1000   GFRNONAA 73* 10/22/2013 1000   GFRNONAA 38* 06/15/2013 1031   GFRAA 85* 10/22/2013 1000   GFRAA 44* 06/15/2013 1031     Hepatic Function Panel     Component Value Date/Time   PROT 6.8 03/05/2013 1236   ALBUMIN 3.8 03/05/2013 1236   AST 31 03/05/2013 1236   ALT 37* 03/05/2013 1236   ALKPHOS 72 03/05/2013 1236   BILITOT 0.7 03/05/2013 1236   BILIDIR 0.2 02/20/2010 1139     CBC    Component Value Date/Time   WBC 15.9* 10/22/2013 1000   RBC 4.87 10/22/2013 1000   HGB 15.1* 10/22/2013 1000   HCT 44.3 10/22/2013 1000   PLT 278 10/22/2013 1000   MCV 91.0 10/22/2013 1000   MCH 31.0 10/22/2013 1000   MCHC 34.1 10/22/2013 1000   RDW 14.0 10/22/2013 1000   LYMPHSABS 1.1 06/15/2013 1042   MONOABS 0.8 06/15/2013 1042   EOSABS 0.0 06/15/2013 1042   BASOSABS 0.0 06/15/2013 1042     BNP    Component Value Date/Time   PROBNP 1789.0* 10/20/2013 1240    Lipid Panel  No results found for this basename: chol,  trig,  hdl,  cholhdl,  vldl,  ldlcalc     RADIOLOGY: No results found.    ASSESSMENT AND PLAN: Cheyenne [name]  is 18 years following her inferior wall myocardial infarction treated with PTCA the right coronary artery and 16 years status post successful CABG revascularization surgery. Her last nuclear perfusion study is unchanged from 2 years  previously.  She now is in permanent atrial fibrillation and is on warfarin anticoagulation.  INR today is 3.0.  Ventricular rate is well controlled.  At times.  She states she has noticed rare palpitations.  She currently is taking metoprolol succinate 100 mg twice a day, alternating with 100 mg in the morning and 50 mg in the evening.  I suggested that she increase her dose to 100 mg twice a day, but her resting pulse gets below 60.  She will then take a 50 mg evening dose.  Her blood pressure today is well controlled and on repeat by me was 115/70 on Cardizem CD 240 mg Toprol in addition to her Altase 10 mg daily.  She also is on Lasix 20 mg and has not had any edema.  She's not having anginal symptoms.  She's not having any bleeding.  She continues to take Lipitor without myalgias, arthralgias, and 80 mg dose.  I will see her in 4 months for followup evaluation or sooner problems arise.  Troy Sine, MD, [facility]  12/13/2013 10:13 PM

## 2013-12-14 ENCOUNTER — Ambulatory Visit: Payer: Medicare Other | Admitting: Pharmacist Clinician (PhC)/ Clinical Pharmacy Specialist

## 2014-01-01 ENCOUNTER — Ambulatory Visit (INDEPENDENT_AMBULATORY_CARE_PROVIDER_SITE_OTHER): Payer: Medicare Other | Admitting: Pharmacist Clinician (PhC)/ Clinical Pharmacy Specialist

## 2014-01-01 DIAGNOSIS — I4891 Unspecified atrial fibrillation: Secondary | ICD-10-CM

## 2014-01-01 DIAGNOSIS — Z7901 Long term (current) use of anticoagulants: Secondary | ICD-10-CM

## 2014-01-01 LAB — POCT INR: INR: 1.8

## 2014-01-01 MED ORDER — NITROGLYCERIN 0.4 MG SL SUBL: 0.4000 mg | SUBLINGUAL_TABLET | SUBLINGUAL | Status: AC | PRN

## 2014-01-01 MED ORDER — WARFARIN SODIUM 5 MG PO TABS: ORAL_TABLET | ORAL | Status: DC

## 2014-01-05 ENCOUNTER — Other Ambulatory Visit: Payer: Self-pay | Admitting: Pharmacist Clinician (PhC)/ Clinical Pharmacy Specialist

## 2014-01-05 MED ORDER — WARFARIN SODIUM 5 MG PO TABS: ORAL_TABLET | ORAL | Status: DC

## 2014-01-11 ENCOUNTER — Telehealth: Payer: Self-pay | Admitting: Cardiovascular Disease

## 2014-01-11 NOTE — Telephone Encounter (Signed)
Ok for switch to another Doctor, hospital.  Spoke with [employer] employee

## 2014-01-11 NOTE — Telephone Encounter (Signed)
Forward to Kristin pharm-d   

## 2014-01-11 NOTE — Telephone Encounter (Signed)
WOE#[id number] -Need clarification on her Warfarin.

## 2014-01-27 ENCOUNTER — Telehealth: Payer: Self-pay | Admitting: Pharmacist Clinician (PhC)/ Clinical Pharmacy Specialist

## 2014-01-27 ENCOUNTER — Encounter (HOSPITAL_BASED_OUTPATIENT_CLINIC_OR_DEPARTMENT_OTHER): Payer: Self-pay | Admitting: Emergency Medicine

## 2014-01-27 ENCOUNTER — Emergency Department (HOSPITAL_BASED_OUTPATIENT_CLINIC_OR_DEPARTMENT_OTHER)
Admission: EM | Admit: 2014-01-27 | Discharge: 2014-01-27 | Disposition: A | Discharge status: Home / Self Care | Payer: Medicare Other | Attending: Emergency Medicine | Admitting: Emergency Medicine

## 2014-01-27 DIAGNOSIS — I4891 Unspecified atrial fibrillation: Secondary | ICD-10-CM | POA: Insufficient documentation

## 2014-01-27 DIAGNOSIS — J449 Chronic obstructive pulmonary disease, unspecified: Secondary | ICD-10-CM | POA: Insufficient documentation

## 2014-01-27 DIAGNOSIS — IMO0002 Reserved for concepts with insufficient information to code with codable children: Secondary | ICD-10-CM | POA: Insufficient documentation

## 2014-01-27 DIAGNOSIS — Z8701 Personal history of pneumonia (recurrent): Secondary | ICD-10-CM | POA: Insufficient documentation

## 2014-01-27 DIAGNOSIS — Z7901 Long term (current) use of anticoagulants: Secondary | ICD-10-CM | POA: Insufficient documentation

## 2014-01-27 DIAGNOSIS — M129 Arthropathy, unspecified: Secondary | ICD-10-CM | POA: Insufficient documentation

## 2014-01-27 DIAGNOSIS — I2510 Atherosclerotic heart disease of native coronary artery without angina pectoris: Secondary | ICD-10-CM | POA: Insufficient documentation

## 2014-01-27 DIAGNOSIS — Z79899 Other long term (current) drug therapy: Secondary | ICD-10-CM | POA: Insufficient documentation

## 2014-01-27 DIAGNOSIS — Z9889 Other specified postprocedural states: Secondary | ICD-10-CM | POA: Insufficient documentation

## 2014-01-27 DIAGNOSIS — E119 Type 2 diabetes mellitus without complications: Secondary | ICD-10-CM | POA: Insufficient documentation

## 2014-01-27 DIAGNOSIS — Z87891 Personal history of nicotine dependence: Secondary | ICD-10-CM | POA: Insufficient documentation

## 2014-01-27 DIAGNOSIS — R040 Epistaxis: Secondary | ICD-10-CM | POA: Insufficient documentation

## 2014-01-27 DIAGNOSIS — I10 Essential (primary) hypertension: Secondary | ICD-10-CM | POA: Insufficient documentation

## 2014-01-27 DIAGNOSIS — Z951 Presence of aortocoronary bypass graft: Secondary | ICD-10-CM | POA: Insufficient documentation

## 2014-01-27 DIAGNOSIS — F411 Generalized anxiety disorder: Secondary | ICD-10-CM | POA: Insufficient documentation

## 2014-01-27 DIAGNOSIS — J4489 Other specified chronic obstructive pulmonary disease: Secondary | ICD-10-CM | POA: Insufficient documentation

## 2014-01-27 DIAGNOSIS — I252 Old myocardial infarction: Secondary | ICD-10-CM | POA: Insufficient documentation

## 2014-01-27 DIAGNOSIS — Z9861 Coronary angioplasty status: Secondary | ICD-10-CM | POA: Insufficient documentation

## 2014-01-27 LAB — CBC WITH DIFFERENTIAL/PLATELET
Basophils Absolute: 0 10*3/uL (ref 0.0–0.1)
Basophils Relative: 0 % (ref 0–1)
Eosinophils Absolute: 0.1 10*3/uL (ref 0.0–0.7)
Eosinophils Relative: 1 % (ref 0–5)
HCT: 44.5 % (ref 36.0–46.0)
Hemoglobin: 14.7 g/dL (ref 12.0–15.0)
LYMPHS ABS: 1.2 10*3/uL (ref 0.7–4.0)
LYMPHS PCT: 13 % (ref 12–46)
MCH: 30.9 pg (ref 26.0–34.0)
MCHC: 33 g/dL (ref 30.0–36.0)
MCV: 93.7 fL (ref 78.0–100.0)
Monocytes Absolute: 0.8 10*3/uL (ref 0.1–1.0)
Monocytes Relative: 9 % (ref 3–12)
NEUTROS ABS: 7.1 10*3/uL (ref 1.7–7.7)
Neutrophils Relative %: 77 % (ref 43–77)
Platelets: 230 10*3/uL (ref 150–400)
RBC: 4.75 MIL/uL (ref 3.87–5.11)
RDW: 14.1 % (ref 11.5–15.5)
WBC: 9.2 10*3/uL (ref 4.0–10.5)

## 2014-01-27 LAB — PROTIME-INR
INR: 1.54 — AB (ref 0.00–1.49)
PROTHROMBIN TIME: 18.1 s — AB (ref 11.6–15.2)

## 2014-01-27 MED ORDER — OXYMETAZOLINE HCL 0.05 % NA SOLN
NASAL | Status: AC
  Administered 2014-01-27: 08:00:00
  Filled 2014-01-27: qty 15

## 2014-01-27 NOTE — Telephone Encounter (Signed)
Pt called this AM, LMOM, had nosebleed this am, went to ER.  INR was 1.5.  Advised pt to restart warfarin on Friday, moved appointment from Friday to next Thursday.

## 2014-01-27 NOTE — ED Notes (Signed)
Per ems: The patient is on coumadin for a. Fib - the patient is having bleeding from the nose. EMS gave afrin nasal spray enroute no change.

## 2014-01-27 NOTE — ED Notes (Signed)
Discussed discharge  Plan  with patient and patient verbalized understanding and stated plan to nurse

## 2014-01-27 NOTE — Discharge Instructions (Signed)
You'll have some slight oozing of blood from the packing as it dries. If you get recurrent bleeding at home, pinch the front of the nose for 10 minutes. If the bleeding does not resolve, please recheck here. Called Dr. Janace Hoard, ENT physician for a followup appointment.  Nosebleed Nosebleeds can be caused by many conditions including trauma, infections, polyps, foreign bodies, dry mucous membranes or climate, medications and air conditioning. Most nosebleeds occur in the front of the nose. It is because of this location that most nosebleeds can be controlled by pinching the nostrils gently and continuously. Do this for at least 10 to 20 minutes. The reason for this long continuous pressure is that you must hold it long enough for the blood to clot. If during that 10 to 20 minute time period, pressure is released, the process may have to be started again. The nosebleed may stop by itself, quit with pressure, need concentrated heating (cautery) or stop with pressure from packing. HOME CARE INSTRUCTIONS   If your nose was packed, try to maintain the pack inside until your caregiver removes it. If a gauze pack was used and it starts to fall out, gently replace or cut the end off. Do not cut if a balloon catheter was used to pack the nose. Otherwise, do not remove unless instructed.  Avoid blowing your nose for 12 hours after treatment. This could dislodge the pack or clot and start bleeding again.  If the bleeding starts again, sit up and bending forward, gently pinch the front half of your nose continuously for 20 minutes.  If bleeding was caused by dry mucous membranes, cover the inside of your nose every morning with a petroleum or antibiotic ointment. Use your little fingertip as an applicator. Do this as needed during dry weather. This will keep the mucous membranes moist and allow them to heal.  Maintain humidity in your home by using less air conditioning or using a humidifier.  Do not use aspirin  or medications which make bleeding more likely. Your caregiver can give you recommendations on this.  Resume normal activities as able but try to avoid straining, lifting or bending at the waist for several days.  If the nosebleeds become recurrent and the cause is unknown, your caregiver may suggest laboratory tests. SEEK IMMEDIATE MEDICAL CARE IF:   Bleeding recurs and cannot be controlled.  There is unusual bleeding from or bruising on other parts of the body.  You have a fever.  Nosebleeds continue.  There is any worsening of the condition which originally brought you in.  You become lightheaded, feel faint, become sweaty or vomit blood. MAKE SURE YOU:   Understand these instructions.  Will watch your condition.  Will get help right away if you are not doing well or get worse. Document Released: 05/02/2005 Document Revised: 10/15/2011 Document Reviewed: 06/24/2009 [facility] Patient Information 2015 [place], [place]. This information is not intended to replace advice given to you by your health care provider. Make sure you discuss any questions you have with your health care provider.

## 2014-01-27 NOTE — ED Provider Notes (Signed)
CSN: [id number]     Arrival date & time 01/27/14  1751 History   First MD Initiated Contact with Patient 01/27/14 [phone]     Chief Complaint  Patient presents with  . Epistaxis      HPI  Patient presents with spontaneous epistaxis from the right anterior naris since early this morning. Allergy symptoms and a runny nose and sneezing yesterday but no blood. She has a history of paroxysmal atrial fibrillation. She is on Coumadin for this. Her last INR last month she states was "a little". However, she states "they told me to hold my dose for one day". Has been on 2.5 mg dosage for years without adjustment, and no recent adjustments. No other signs of bleeding i.e. no hematuria or bloody stools. In by paramedics. No significant blood loss at home per her description "just using a Kleenex".  Past Medical History  Diagnosis Date  . Atrial fibrillation   . Coronary artery disease   . COPD (chronic obstructive pulmonary disease)   . Hypertension 11/04/2009    renal doppler showed abd aorta normal size  low velocities  w/ moderate amt plaque; rgt & lft kidney  normal size,renal arteries normal patency  . Anxiety   . Pneumonia     5-6 years ago  . Arthritis   . Myocardial infarction     Mild MI 1987,inferior wall  . Paroxysmal atrial fibrillation     last hospitalized July 2013  . Hyperlipidemia   . PVD (peripheral vascular disease) 09/18/2011    carotid doppler showed mild amount of fibrous plaque both carotid arteries w/o significant flow reduction or stenosis  . PVD (peripheral vascular disease) 09/18/2011    Lower extremity doppler showed ABIof 0.97 on the right,0.83 on the left.,50-60%narrowing rgt common iliac greater than 70% right SFA, right posterior tibial & peroneal arteries occlude  . PVD (peripheral vascular disease) 09/14/2010    ABI's in 2012 were 0.94 and 0.81 but velocities appeared more sigificantly changed  . PVD (peripheral vascular disease) with claudication 09/15/2007   abd aotra moder. amt plaque w/low velocities, left SFA occluded vessel, rgt prox. & mid SFA elvated  velocities ABI rgt O.87,ABI  lft 0.77   . Peripheral angiopathy 03/21/2004    total left SFA w/three vessel runoff though tibial vessels are diffusely disease tx medically,left 70% distal lft external iliac,70% rgt common iliac, 50-60% prox and mid rgt SFA ,  . Diabetes mellitus without complication    Past Surgical History  Procedure Laterality Date  . Nasal sinus surgery    . Quadruple  bypass    . Cholecystectomy    . Eye surgery      bilateral catracts  . Coronary angioplasty  1987    emergent,60% LAD  . Coronary artery bypass graft  03/23/1998    CABGx4 left internal mammary artery to LAD, SVG to diagonal branch to LAD, first obtuse branch Left Circ,and posterior descending branch of RCA  . Doppler echocardiography  05/29/2012    proximal septal thickening w/normal systolic and probable grade 2 diastolic dysfunction,mod LA dilatation, mild  annilar calcifcation w/mild to mod MR, mild to mod.TR , mild pulm. HTN w/upper normal pulm. pressures. mild aortic sclerosis w/o stenosis and pulm.valvular regrurgitation  . Doppler echocardiography  04/16/2011    EF =>55%, LV normal,Grade II diastolic dysfunction, aortic valve moderately sclerotic  . Doppler echocardiography  06/16/2009    EF => 55%,LV normal ,mild to moder. mitral annular calcifcation,no aortic stenosis, aortic valve appears moder.sclerotic  .  Cardiac stress test  11/09/2010    EF 75%, small area of reversible lateral ischemia. SDS 2 . Extent 3%  . Cardiac stress test  11/04/2009    EF 75%, LV normal ,   . Cardiac catheterization  03/22/1998    severe four vessel coronary disease normal LV function  . Atrial flutter ablation  11/11/1996    Dr Caryl Comes did RFablation atrial flutter  and sinus node re-entry tachycardia  . Cardioversion N/A 06/19/2013    Procedure: CARDIOVERSION;  Surgeon: Troy Sine, MD;  Location: [facility];   Service: Cardiovascular;  Laterality: N/A;   Family History  Problem Relation Age of Onset  . Heart disease Father   . Breast cancer Paternal Aunt    History  Substance Use Topics  . Smoking status: Former Smoker -- 1.50 packs/day    Types: Cigarettes    Quit date: 03/23/1998  . Smokeless tobacco: Never Used     Comment: quit smoking 14 years ago.  . Alcohol Use: No   OB History   Grav Para Term Preterm Abortions TAB SAB Ect Mult Living   3 3        3      Review of Systems  Constitutional: Negative for fever, chills, diaphoresis, appetite change and fatigue.  HENT: Positive for nosebleeds, postnasal drip, rhinorrhea and sneezing. Negative for mouth sores, sore throat and trouble swallowing.   Eyes: Negative for visual disturbance.  Respiratory: Negative for cough, chest tightness, shortness of breath and wheezing.   Cardiovascular: Negative for chest pain.  Gastrointestinal: Negative for nausea, vomiting, abdominal pain, diarrhea and abdominal distention.  Endocrine: Negative for polydipsia, polyphagia and polyuria.  Genitourinary: Negative for dysuria, frequency and hematuria.  Musculoskeletal: Negative for gait problem.  Skin: Negative for color change, pallor and rash.  Neurological: Negative for dizziness, syncope, light-headedness and headaches.  Hematological: Does not bruise/bleed easily.  Psychiatric/Behavioral: Negative for behavioral problems and confusion.      Allergies  Cephalexin; Elavil; Nifedipine; and Tegretol  Home Medications   Prior to Admission medications   Medication Sig Start Date End Date Taking? Authorizing Aiko Belko  acetaminophen (TYLENOL) 500 MG tablet Take 1,000 mg by mouth every 6 (six) hours as needed. For pain    Historical Maryl Blalock, MD  ALPRAZolam (XANAX) 0.25 MG tablet Take 0.5 mg by mouth at bedtime. For anxiety    Historical Aydan Phoenix, MD  atorvastatin (LIPITOR) 80 MG tablet Take 1 tablet (80 mg total) by mouth every evening. 11/02/13    Troy Sine, MD  calcium carbonate (OS-CAL) 600 MG TABS Take 600 mg by mouth 2 (two) times daily with a meal.    Historical Blaise Palladino, MD  cilostazol (PLETAL) 100 MG tablet Take 1 tablet (100 mg total) by mouth 2 (two) times daily. 08/31/13   Troy Sine, MD  dicyclomine (BENTYL) 10 MG capsule Take 10 mg by mouth daily as needed for spasms.  07/10/12   Historical Mekia Dipinto, MD  diltiazem (CARDIZEM CD) 120 MG 24 hr capsule Take 240 mg by mouth daily.  08/24/13   Historical Esmirna Ravan, MD  esomeprazole (NEXIUM) 40 MG capsule Take 40 mg by mouth daily before breakfast.    Historical Micha Dosanjh, MD  ezetimibe (ZETIA) 10 MG tablet Take 1 tablet (10 mg total) by mouth every evening. 11/02/13   Troy Sine, MD  fluticasone (FLONASE) 50 MCG/ACT nasal spray Place 2 sprays into the nose daily as needed for allergies.  02/28/13   Historical Dejanique Ruehl, MD  furosemide (LASIX) 20  MG tablet Take 20 mg by mouth daily.     Historical Cristy Colmenares, MD  HYDROcodone-acetaminophen (NORCO/VICODIN) 5-325 MG per tablet Take 1 tablet by mouth every 8 (eight) hours as needed. 12/15/13   Historical Jatavius Ellenwood, MD  isosorbide mononitrate (IMDUR) 30 MG 24 hr tablet Take 1 tablet (30 mg total) by mouth daily before breakfast. 11/02/13   Troy Sine, MD  Levocetirizine Dihydrochloride (XYZAL PO) Take 10 mg by mouth every evening.    Historical Quantia Grullon, MD  metoprolol succinate (TOPROL-XL) 100 MG 24 hr tablet On even days take 100 mg twice daily. On odd days take 1 tablet in the AM and 1/2 tablet in the PM. 08/31/13   Troy Sine, MD  nitroGLYCERIN (NITROSTAT) 0.4 MG SL tablet Place 1 tablet (0.4 mg total) under the tongue every 5 (five) minutes as needed for chest pain. 01/01/14   Troy Sine, MD  Omega-3 Fatty Acids (FISH OIL) 1200 MG CAPS Take 1 capsule by mouth 2 (two) times daily.    Historical Helayne Metsker, MD  potassium chloride SA (K-DUR,KLOR-CON) 20 MEQ tablet Take 20 mEq by mouth 2 (two) times daily.     Historical Kierrah Kilbride, MD   psyllium (METAMUCIL) 58.6 % powder Take 1 packet by mouth daily. At lunch or supper    Historical Olympia Adelsberger, MD  ramipril (ALTACE) 10 MG capsule Take 1 capsule (10 mg total) by mouth daily. 11/02/13   Troy Sine, MD  Tiotropium Bromide Monohydrate (SPIRIVA HANDIHALER IN) Inhale 1 capsule into the lungs daily.     Historical Lalitha Ilyas, MD  warfarin (COUMADIN) 5 MG tablet Take 1 tablet by mouth daily or as directed 01/05/14   Tommy Medal, RPH-CPP   BP 169/104  Pulse 105  Temp(Src) 98.4 F (36.9 C) (Oral)  Resp 18  Ht 5\' 2"  (1.575 m)  Wt 140 lb (63.504 kg)  BMI 25.60 kg/m2  SpO2 95% Physical Exam  Constitutional: She is oriented to person, place, and time. She appears well-developed and well-nourished. No distress.  HENT:  Head: Normocephalic.  Nose:    Eyes: Conjunctivae are normal. Pupils are equal, round, and reactive to light. No scleral icterus.  Neck: Normal range of motion. Neck supple. No thyromegaly present.  Cardiovascular: Normal rate.  Exam reveals no gallop and no friction rub.   No murmur heard. Pulmonary/Chest: Effort normal and breath sounds normal. No respiratory distress. She has no wheezes. She has no rales.  Abdominal: Soft. Bowel sounds are normal. She exhibits no distension. There is no tenderness. There is no rebound.  Musculoskeletal: Normal range of motion.  Neurological: She is alert and oriented to person, place, and time.  Skin: Skin is warm and dry. No rash noted.  Psychiatric: She has a normal mood and affect. Her behavior is normal.    ED Course  EPISTAXIS MANAGEMENT Date/Time: 01/27/2014 8:17 AM Performed by: Tanna Furry Authorized by: Tanna Furry Consent: Verbal consent obtained. written consent not obtained. Risks and benefits: risks, benefits and alternatives were discussed Consent given by: patient Patient understanding: patient states understanding of the procedure being performed Patient sedated: no Treatment site: right  anterior Post-procedure assessment: bleeding decreased Treatment complexity: simple Comments: Cotton instilled with Neo-Synephrine placed in the anterior naris for 30 minutes. Then removed. Slight continued capillary bleeding noted. Merocel sponge placed in the anterior naris and infiltrated with Neo-Synephrine. Good initial control. Will plan one-hour observation.   (including critical care time) Labs Review Labs Reviewed  PROTIME-INR - Abnormal; Notable for the following:  Prothrombin Time 18.1 (*)    INR 1.54 (*)    All other components within normal limits  CBC WITH DIFFERENTIAL    Imaging Review No results found.   EKG Interpretation None      MDM   Final diagnoses:  Epistaxis    Patient reexamined at 52, and a 19. Some blood noted in the Merocel sponge. No active bleeding noted anteriorly or in the posterior pharynx. She has seen Dr. Janace Hoard of ENT in the past. Given the patient his information again. I've asked her to call for a followup appointment in 5 days for packing removal, reexam, consideration for cautery as needed.    Tanna Furry, MD 01/27/14 [phone]

## 2014-01-29 ENCOUNTER — Ambulatory Visit: Payer: Medicare Other | Admitting: Pharmacist Clinician (PhC)/ Clinical Pharmacy Specialist

## 2014-02-03 ENCOUNTER — Telehealth: Payer: Self-pay | Admitting: Pharmacist Clinician (PhC)/ Clinical Pharmacy Specialist

## 2014-02-04 ENCOUNTER — Ambulatory Visit: Payer: Medicare Other | Admitting: Pharmacist Clinician (PhC)/ Clinical Pharmacy Specialist

## 2014-02-08 ENCOUNTER — Ambulatory Visit (INDEPENDENT_AMBULATORY_CARE_PROVIDER_SITE_OTHER): Payer: Medicare Other | Admitting: Pharmacist

## 2014-02-08 DIAGNOSIS — I4891 Unspecified atrial fibrillation: Secondary | ICD-10-CM

## 2014-02-08 DIAGNOSIS — Z7901 Long term (current) use of anticoagulants: Secondary | ICD-10-CM

## 2014-02-08 LAB — POCT INR: INR: 1.4

## 2014-02-08 NOTE — Telephone Encounter (Signed)
Closed enounter °

## 2014-02-23 ENCOUNTER — Ambulatory Visit (INDEPENDENT_AMBULATORY_CARE_PROVIDER_SITE_OTHER): Payer: Medicare Other | Admitting: Pharmacist Clinician (PhC)/ Clinical Pharmacy Specialist

## 2014-02-23 DIAGNOSIS — Z7901 Long term (current) use of anticoagulants: Secondary | ICD-10-CM

## 2014-02-23 DIAGNOSIS — I4891 Unspecified atrial fibrillation: Secondary | ICD-10-CM

## 2014-02-23 LAB — POCT INR: INR: 2

## 2014-03-04 ENCOUNTER — Telehealth: Payer: Self-pay | Admitting: Cardiovascular Disease

## 2014-03-04 ENCOUNTER — Encounter: Payer: Self-pay | Admitting: Cardiology

## 2014-03-04 ENCOUNTER — Other Ambulatory Visit: Payer: Self-pay | Admitting: *Deleted

## 2014-03-04 ENCOUNTER — Ambulatory Visit (INDEPENDENT_AMBULATORY_CARE_PROVIDER_SITE_OTHER): Payer: Medicare Other | Admitting: Cardiology

## 2014-03-04 VITALS — BP 130/70 | HR 98 | Ht 62.0 in | Wt 142.0 lb

## 2014-03-04 DIAGNOSIS — I2510 Atherosclerotic heart disease of native coronary artery without angina pectoris: Secondary | ICD-10-CM

## 2014-03-04 DIAGNOSIS — I480 Paroxysmal atrial fibrillation: Secondary | ICD-10-CM

## 2014-03-04 DIAGNOSIS — I4891 Unspecified atrial fibrillation: Secondary | ICD-10-CM

## 2014-03-04 MED ORDER — METOPROLOL TARTRATE 50 MG PO TABS: 25.0000 mg | ORAL_TABLET | Freq: Every day | ORAL | Status: DC | PRN

## 2014-03-04 NOTE — Telephone Encounter (Signed)
Pt thinks she is un atrial fib,rate is over 100.

## 2014-03-04 NOTE — Progress Notes (Signed)
HPI The patient was added to my schedule.  She sees Dr. Claiborne Billings.  She has a history of inferior myocardial infarction and CABG. She has atrial flutter status post ablation. She has atrial fibrillation.  She has very astute and aware of her medical problems.  She noted her heart rate to be higher this morning. He was in the 120s 130s for at least 20 minutes. She had only taken her medication. She felt anxious with this. She noted it to be elevated when she took her pulse and checked herself on the pulse oximeter. She did not have any presyncope or syncope. She did not have any chest pressure, neck or arm discomfort. She did not have any weight gain or edema.  Allergies  Allergen Reactions  . Cephalexin Diarrhea  . Elavil [Amitriptyline] Other (See Comments)    Felt drunk  . Nifedipine Other (See Comments)    Heart races  . Tegretol [Carbamazepine] Other (See Comments)    Felt drunk    Current Outpatient Prescriptions  Medication Sig Dispense Refill  . acetaminophen (TYLENOL) 500 MG tablet Take 1,000 mg by mouth every 6 (six) hours as needed. For pain      . ALPRAZolam (XANAX) 0.25 MG tablet Take 0.5 mg by mouth at bedtime. For anxiety      . atorvastatin (LIPITOR) 80 MG tablet Take 1 tablet (80 mg total) by mouth every evening.  90 tablet  3  . calcium carbonate (OS-CAL) 600 MG TABS Take 600 mg by mouth 2 (two) times daily with a meal.      . cilostazol (PLETAL) 100 MG tablet Take 1 tablet (100 mg total) by mouth 2 (two) times daily.  180 tablet  3  . dicyclomine (BENTYL) 10 MG capsule Take 10 mg by mouth daily as needed for spasms.       Marland Kitchen diltiazem (CARDIZEM CD) 120 MG 24 hr capsule Take 240 mg by mouth daily.       Marland Kitchen esomeprazole (NEXIUM) 40 MG capsule Take 40 mg by mouth daily before breakfast.      . ezetimibe (ZETIA) 10 MG tablet Take 1 tablet (10 mg total) by mouth every evening.  90 tablet  3  . fluticasone (FLONASE) 50 MCG/ACT nasal spray Place 2 sprays into the nose daily as  needed for allergies.       . furosemide (LASIX) 20 MG tablet Take 20 mg by mouth daily.       Marland Kitchen HYDROcodone-acetaminophen (NORCO/VICODIN) 5-325 MG per tablet Take 1 tablet by mouth every 8 (eight) hours as needed.      . isosorbide mononitrate (IMDUR) 30 MG 24 hr tablet Take 1 tablet (30 mg total) by mouth daily before breakfast.  90 tablet  3  . Levocetirizine Dihydrochloride (XYZAL PO) Take 10 mg by mouth every evening.      . metoprolol succinate (TOPROL-XL) 100 MG 24 hr tablet Take 100 mg by mouth 2 (two) times daily.       . nitroGLYCERIN (NITROSTAT) 0.4 MG SL tablet Place 1 tablet (0.4 mg total) under the tongue every 5 (five) minutes as needed for chest pain.  25 tablet  9  . Omega-3 Fatty Acids (FISH OIL) 1200 MG CAPS Take 1 capsule by mouth 2 (two) times daily.      . potassium chloride SA (K-DUR,KLOR-CON) 20 MEQ tablet Take 20 mEq by mouth 2 (two) times daily.       . psyllium (METAMUCIL) 58.6 % powder Take 1 packet  by mouth daily. At lunch or supper      . ramipril (ALTACE) 10 MG capsule Take 1 capsule (10 mg total) by mouth daily.  90 capsule  3  . Tiotropium Bromide Monohydrate (SPIRIVA HANDIHALER IN) Inhale 1 capsule into the lungs daily.       Marland Kitchen warfarin (COUMADIN) 5 MG tablet Take 1 tablet by mouth daily or as directed  90 tablet  1   No current facility-administered medications for this visit.    Past Medical History  Diagnosis Date  . Atrial fibrillation   . Coronary artery disease   . COPD (chronic obstructive pulmonary disease)   . Hypertension 11/04/2009    renal doppler showed abd aorta normal size  low velocities  w/ moderate amt plaque; rgt & lft kidney  normal size,renal arteries normal patency  . Anxiety   . Pneumonia     5-6 years ago  . Arthritis   . Myocardial infarction     Mild MI 1987,inferior wall  . Paroxysmal atrial fibrillation     last hospitalized July 2013  . Hyperlipidemia   . PVD (peripheral vascular disease) 09/18/2011    carotid doppler  showed mild amount of fibrous plaque both carotid arteries w/o significant flow reduction or stenosis  . PVD (peripheral vascular disease) 09/18/2011    Lower extremity doppler showed ABIof 0.97 on the right,0.83 on the left.,50-60%narrowing rgt common iliac greater than 70% right SFA, right posterior tibial & peroneal arteries occlude  . PVD (peripheral vascular disease) 09/14/2010    ABI's in 2012 were 0.94 and 0.81 but velocities appeared more sigificantly changed  . PVD (peripheral vascular disease) with claudication 09/15/2007    abd aotra moder. amt plaque w/low velocities, left SFA occluded vessel, rgt prox. & mid SFA elvated  velocities ABI rgt O.87,ABI  lft 0.77   . Peripheral angiopathy 03/21/2004    total left SFA w/three vessel runoff though tibial vessels are diffusely disease tx medically,left 70% distal lft external iliac,70% rgt common iliac, 50-60% prox and mid rgt SFA ,  . Diabetes mellitus without complication     Past Surgical History  Procedure Laterality Date  . Nasal sinus surgery    . Quadruple  bypass    . Cholecystectomy    . Eye surgery      bilateral catracts  . Coronary angioplasty  1987    emergent,60% LAD  . Coronary artery bypass graft  03/23/1998    CABGx4 left internal mammary artery to LAD, SVG to diagonal branch to LAD, first obtuse branch Left Circ,and posterior descending branch of RCA  . Doppler echocardiography  05/29/2012    proximal septal thickening w/normal systolic and probable grade 2 diastolic dysfunction,mod LA dilatation, mild  annilar calcifcation w/mild to mod MR, mild to mod.TR , mild pulm. HTN w/upper normal pulm. pressures. mild aortic sclerosis w/o stenosis and pulm.valvular regrurgitation  . Doppler echocardiography  04/16/2011    EF =>55%, LV normal,Grade II diastolic dysfunction, aortic valve moderately sclerotic  . Doppler echocardiography  06/16/2009    EF => 55%,LV normal ,mild to moder. mitral annular calcifcation,no aortic  stenosis, aortic valve appears moder.sclerotic  . Cardiac stress test  11/09/2010    EF 75%, small area of reversible lateral ischemia. SDS 2 . Extent 3%  . Cardiac stress test  11/04/2009    EF 75%, LV normal ,   . Cardiac catheterization  03/22/1998    severe four vessel coronary disease normal LV function  . Atrial flutter ablation  11/11/1996    Dr Caryl Comes did RFablation atrial flutter  and sinus node re-entry tachycardia  . Cardioversion N/A 06/19/2013    Procedure: CARDIOVERSION;  Surgeon: Troy Sine, MD;  Location: [facility] ENDOSCOPY;  Service: Cardiovascular;  Laterality: N/A;    ROS:  As stated in the HPI and negative for all other systems.  PHYSICAL EXAM BP 130/70  Pulse 98  Ht 5\' 2"  (1.575 m)  Wt 142 lb (64.411 kg)  BMI 25.97 kg/m2 GENERAL:  Well appearing HEENT:  Pupils equal round and reactive, fundi not visualized, oral mucosa unremarkable NECK:  No jugular venous distention, waveform within normal limits, carotid upstroke brisk and symmetric, no bruits, no thyromegaly LYMPHATICS:  No cervical, inguinal adenopathy LUNGS:  Clear to auscultation bilaterally BACK:  No CVA tenderness CHEST:  Unremarkable HEART:  PMI not displaced or sustained,S1 and S2 within normal limits, no S3, no clicks, no rubs, no irregular ABD:  Flat, positive bowel sounds normal in frequency in pitch, no bruits, no rebound, no guarding, no midline pulsatile mass, no hepatomegaly, no splenomegaly EXT:  2 plus pulses throughout, no edema, no cyanosis no clubbing SKIN:  No rashes no nodules NEURO:  Cranial nerves II through XII grossly intact, motor grossly intact throughout PSYCH:  Cognitively intact, oriented to person place and time  EKG:  Atrial fibrillation, rate 98, left axis deviation, left anterior fascicular block, QTC prolonged , nonspecific T-wave changes. 03/04/2014  ASSESSMENT AND PLAN  ATRIAL FIB:  She is in permanent atrial fibrillation.  However, he was going faster today. I did discuss  with her taking 25 mg of Xanax it is going fast. I also gave her prescription for 12.5 mg of metoprolol to be taken in addition to her other medicines as a pill in pocket approach. We discussed ablation and I would not suggest this and she has uncontrollable symptoms. She's happy with warfarin and wants to continue on this.  CAD:  She had a stress perfusion study which was normal and June of last year.  No further testing is indicated.

## 2014-03-04 NOTE — Telephone Encounter (Signed)
Patient called to report that her a-fib is "acting up." states that her heart rate is well over 100 bpm. Appointment to see Dr. Percival Spanish this afternoon for evaluation.

## 2014-03-04 NOTE — Patient Instructions (Addendum)
Your physician recommends that you schedule a follow-up appointment in: 6 months with Dr. Percival Spanish  Take an extra 1/2 of a 25 mg metoprolol as needed

## 2014-03-11 ENCOUNTER — Other Ambulatory Visit: Payer: Self-pay | Admitting: *Deleted

## 2014-03-11 MED ORDER — DILTIAZEM HCL ER COATED BEADS 120 MG PO CP24: 240.0000 mg | ORAL_CAPSULE | Freq: Every day | ORAL | Status: DC

## 2014-03-24 ENCOUNTER — Telehealth: Payer: Self-pay | Admitting: Pharmacist Clinician (PhC)/ Clinical Pharmacy Specialist

## 2014-03-24 ENCOUNTER — Ambulatory Visit (INDEPENDENT_AMBULATORY_CARE_PROVIDER_SITE_OTHER): Payer: Medicare Other | Admitting: Pharmacist Clinician (PhC)/ Clinical Pharmacy Specialist

## 2014-03-24 DIAGNOSIS — Z7901 Long term (current) use of anticoagulants: Secondary | ICD-10-CM

## 2014-03-24 DIAGNOSIS — I4891 Unspecified atrial fibrillation: Secondary | ICD-10-CM

## 2014-03-24 LAB — POCT INR: INR: 1.9

## 2014-03-24 NOTE — Telephone Encounter (Signed)
Pt taking extra 1/2 tablet metoprolol for increased heart rate.  Not working for her.  I advised she increase to 1 tablet (25mg ) to see if that makes any improvement.  Pt to see TK 9/8, will review with him

## 2014-04-09 ENCOUNTER — Other Ambulatory Visit: Payer: Self-pay | Admitting: *Deleted

## 2014-04-09 MED ORDER — METOPROLOL SUCCINATE ER 100 MG PO TB24: 100.0000 mg | ORAL_TABLET | Freq: Two times a day (BID) | ORAL | Status: DC

## 2014-04-13 ENCOUNTER — Ambulatory Visit (INDEPENDENT_AMBULATORY_CARE_PROVIDER_SITE_OTHER): Payer: Medicare Other | Admitting: Cardiovascular Disease

## 2014-04-13 ENCOUNTER — Ambulatory Visit (INDEPENDENT_AMBULATORY_CARE_PROVIDER_SITE_OTHER): Payer: Medicare Other | Admitting: *Deleted

## 2014-04-13 VITALS — BP 120/62 | HR 93 | Ht 62.0 in | Wt 140.5 lb

## 2014-04-13 DIAGNOSIS — I480 Paroxysmal atrial fibrillation: Secondary | ICD-10-CM

## 2014-04-13 DIAGNOSIS — Z7901 Long term (current) use of anticoagulants: Secondary | ICD-10-CM

## 2014-04-13 DIAGNOSIS — F411 Generalized anxiety disorder: Secondary | ICD-10-CM

## 2014-04-13 DIAGNOSIS — I4891 Unspecified atrial fibrillation: Secondary | ICD-10-CM

## 2014-04-13 DIAGNOSIS — I10 Essential (primary) hypertension: Secondary | ICD-10-CM

## 2014-04-13 DIAGNOSIS — E785 Hyperlipidemia, unspecified: Secondary | ICD-10-CM

## 2014-04-13 DIAGNOSIS — I2510 Atherosclerotic heart disease of native coronary artery without angina pectoris: Secondary | ICD-10-CM

## 2014-04-13 LAB — POCT INR: INR: 2

## 2014-04-13 MED ORDER — DILTIAZEM HCL ER COATED BEADS 300 MG PO CP24
300.0000 mg | ORAL_CAPSULE | Freq: Every day | ORAL | Status: DC
Start: 2014-04-13 — End: 2014-05-25

## 2014-04-13 MED ORDER — METOPROLOL SUCCINATE ER 100 MG PO TB24: 100.0000 mg | ORAL_TABLET | Freq: Two times a day (BID) | ORAL | Status: AC

## 2014-04-13 NOTE — Patient Instructions (Signed)
Your physician has recommended you make the following change in your medication: the diltiazem has been increased to 300 mg. This has already been sent to the pharmacy.  Your physician recommends that you schedule a follow-up appointment in: 4 months.

## 2014-04-14 ENCOUNTER — Encounter: Payer: Self-pay | Admitting: Cardiovascular Disease

## 2014-04-14 NOTE — Progress Notes (Signed)
Patient ID: De Nurse, female   DOB: 1924/10/09, 80 y.o.   MRN: [id number]      HPI: Cheyenne [name] is a 80 y.o. female who presents to the office today for a 4 month cardiology evaluation.  Cheyenne [name] has known coronary artery disease. She suffered an inferior wall myocardial infarction in 1997 treated with PTCA of her RCA and ultimately underwent CABG surgery. She has a remote history of atrial flutter and is status post atrial flutter ablation.  In She also has a history of hyperlipidemia, hypertension, as well as GERD  On 01/29/2013 a 2 year follow-up nuclear study continued to show normal perfusion. Post-stress ejection fraction was 73%. There was no evidence for scar or ischemia.  She had a history of paroxysmal atrial fibrillation . She was readmitted to the hospital last October 2014 after Cheyenne [name]  developed another episode of atrial fibrillation with rapid ventricular response. She has been on chronic anticoagulation. She had taken additional doses of Lopressor prior to her evaluation. Her amiodarone dose was adjusted and ultimately she converted to sinus rhythm.however, she has developed recurrent atrial fibrillation is and now is in permanent atrial fibrillation.  She was hospitalized in March  2015 with pneumonia.  She has required high-dose beta blockade and calcium channel blocker therapy for rate suppression of her atrial fibrillation.  As I last saw her 4 months ago, she admits to being under increased stress.  Unfortunately, her daughter lost a significant amount of [name] money in an Internet scam.  Her heart rate when she gets stressed and increase over 100 despite being on Cardizem 240 mg and Toprol-XL 200 mg daily, and a 100 mg twice a day regimen.  She also is on Altase 10 mg for hypertension and takes furosemide 20 mg.  She denies any recent leg swelling.  She does have a history of hyperlipidemia and is on combination therapy with atorvastatin 80 mg and Zetia 10 mg.  There is a  history of COPD, for which he takes Minerva Fester, and this has been well controlled.  She presents for followup evaluation. Past Medical History  Diagnosis Date  . Atrial fibrillation   . Coronary artery disease   . COPD (chronic obstructive pulmonary disease)   . Hypertension 11/04/2009    renal doppler showed abd aorta normal size  low velocities  w/ moderate amt plaque; rgt & lft kidney  normal size,renal arteries normal patency  . Anxiety   . Pneumonia     5-6 years ago  . Arthritis   . Myocardial infarction     Mild MI 1987,inferior wall  . Paroxysmal atrial fibrillation     last hospitalized July 2013  . Hyperlipidemia   . PVD (peripheral vascular disease) 09/18/2011    carotid doppler showed mild amount of fibrous plaque both carotid arteries w/o significant flow reduction or stenosis  . PVD (peripheral vascular disease) 09/18/2011    Lower extremity doppler showed ABIof 0.97 on the right,0.83 on the left.,50-60%narrowing rgt common iliac greater than 70% right SFA, right posterior tibial & peroneal arteries occlude  . PVD (peripheral vascular disease) 09/14/2010    ABI's in 2012 were 0.94 and 0.81 but velocities appeared more sigificantly changed  . PVD (peripheral vascular disease) with claudication 09/15/2007    abd aotra moder. amt plaque w/low velocities, left SFA occluded vessel, rgt prox. & mid SFA elvated  velocities ABI rgt O.87,ABI  lft 0.77   . Peripheral angiopathy 03/21/2004    total left SFA w/three  vessel runoff though tibial vessels are diffusely disease tx medically,left 70% distal lft external iliac,70% rgt common iliac, 50-60% prox and mid rgt SFA ,  . Diabetes mellitus without complication     Past Surgical History  Procedure Laterality Date  . Nasal sinus surgery    . Quadruple  bypass    . Cholecystectomy    . Eye surgery      bilateral catracts  . Coronary angioplasty  1987    emergent,60% LAD  . Coronary artery bypass graft  03/23/1998    CABGx4 left  internal mammary artery to LAD, SVG to diagonal branch to LAD, first obtuse branch Left Circ,and posterior descending branch of RCA  . Doppler echocardiography  05/29/2012    proximal septal thickening w/normal systolic and probable grade 2 diastolic dysfunction,mod LA dilatation, mild  annilar calcifcation w/mild to mod MR, mild to mod.TR , mild pulm. HTN w/upper normal pulm. pressures. mild aortic sclerosis w/o stenosis and pulm.valvular regrurgitation  . Doppler echocardiography  04/16/2011    EF =>55%, LV normal,Grade II diastolic dysfunction, aortic valve moderately sclerotic  . Doppler echocardiography  06/16/2009    EF => 55%,LV normal ,mild to moder. mitral annular calcifcation,no aortic stenosis, aortic valve appears moder.sclerotic  . Cardiac stress test  11/09/2010    EF 75%, small area of reversible lateral ischemia. SDS 2 . Extent 3%  . Cardiac stress test  11/04/2009    EF 75%, LV normal ,   . Cardiac catheterization  03/22/1998    severe four vessel coronary disease normal LV function  . Atrial flutter ablation  11/11/1996    Dr Caryl Comes did RFablation atrial flutter  and sinus node re-entry tachycardia  . Cardioversion N/A 06/19/2013    Procedure: CARDIOVERSION;  Surgeon: Troy Sine, MD;  Location: [facility] ENDOSCOPY;  Service: Cardiovascular;  Laterality: N/A;    Allergies  Allergen Reactions  . Cephalexin Diarrhea  . Elavil [Amitriptyline] Other (See Comments)    Felt drunk  . Nifedipine Other (See Comments)    Heart races  . Tegretol [Carbamazepine] Other (See Comments)    Felt drunk    Current Outpatient Prescriptions  Medication Sig Dispense Refill  . acetaminophen (TYLENOL) 500 MG tablet Take 1,000 mg by mouth every 6 (six) hours as needed. For pain      . ALPRAZolam (XANAX) 0.25 MG tablet Take 0.5 mg by mouth at bedtime. For anxiety      . atorvastatin (LIPITOR) 80 MG tablet Take 1 tablet (80 mg total) by mouth every evening.  90 tablet  3  . calcium carbonate  (OS-CAL) 600 MG TABS Take 600 mg by mouth 2 (two) times daily with a meal.      . cilostazol (PLETAL) 100 MG tablet Take 1 tablet (100 mg total) by mouth 2 (two) times daily.  180 tablet  3  . dicyclomine (BENTYL) 10 MG capsule Take 10 mg by mouth daily as needed for spasms.       Marland Kitchen esomeprazole (NEXIUM) 40 MG capsule Take 40 mg by mouth daily before breakfast.      . ezetimibe (ZETIA) 10 MG tablet Take 1 tablet (10 mg total) by mouth every evening.  90 tablet  3  . fluticasone (FLONASE) 50 MCG/ACT nasal spray Place 2 sprays into the nose daily as needed for allergies.       . furosemide (LASIX) 20 MG tablet Take 20 mg by mouth daily.       Marland Kitchen HYDROcodone-acetaminophen (NORCO/VICODIN) 5-325 MG per  tablet Take 1 tablet by mouth every 8 (eight) hours as needed.      . isosorbide mononitrate (IMDUR) 30 MG 24 hr tablet Take 1 tablet (30 mg total) by mouth daily before breakfast.  90 tablet  3  . Levocetirizine Dihydrochloride (XYZAL PO) Take 10 mg by mouth every evening.      . metoprolol (LOPRESSOR) 50 MG tablet Take 50 mg by mouth daily as needed.      . metoprolol succinate (TOPROL-XL) 100 MG 24 hr tablet Take 1 tablet (100 mg total) by mouth 2 (two) times daily.  60 tablet  1  . nitroGLYCERIN (NITROSTAT) 0.4 MG SL tablet Place 1 tablet (0.4 mg total) under the tongue every 5 (five) minutes as needed for chest pain.  25 tablet  9  . Omega-3 Fatty Acids (FISH OIL) 1200 MG CAPS Take 1 capsule by mouth 2 (two) times daily.      . potassium chloride SA (K-DUR,KLOR-CON) 20 MEQ tablet Take 20 mEq by mouth 2 (two) times daily.       . psyllium (METAMUCIL) 58.6 % powder Take 1 packet by mouth daily. At lunch or supper      . ramipril (ALTACE) 10 MG capsule Take 1 capsule (10 mg total) by mouth daily.  90 capsule  3  . Tiotropium Bromide Monohydrate (SPIRIVA HANDIHALER IN) Inhale 1 capsule into the lungs daily.       Marland Kitchen warfarin (COUMADIN) 5 MG tablet Take 1 tablet by mouth daily or as directed  90 tablet  1    . diltiazem (CARDIZEM CD) 300 MG 24 hr capsule Take 1 capsule (300 mg total) by mouth daily.  90 capsule  3   No current facility-administered medications for this visit.    Socially she is widowed for 4 years. She has 3 children and one grandchild. She has a remote tobacco history but quit in 1999. There is no alcohol use. She was born in [place]. She moved to [place] in [place] after [place]: Negative; No fevers, chills, or night sweats; no weight change HEENT: Negative; No changes in vision or hearing, sinus congestion, difficulty swallowing Pulmonary: Recent hospitalization with pneumonia No cough, wheezing, shortness of breath, hemoptysis Cardiovascular: Negative; No chest pain, presyncope, syncope, palpatations GI: Recent irritable bowel syndrome flare up; Currently no nausea, vomiting, diarrhea, or abdominal pain GU: Negative; No dysuria, hematuria, or difficulty voiding Musculoskeletal: Intermittent knee discomfort; and right leg discomfort.  She has osteoarthritis. Hematologic/Oncology: Negative; no easy bruising, bleeding Endocrine: Negative; no heat/cold intolerance; she has a history of glucose intolerance Neuro: Negative; no changes in balance, headaches Skin: Negative; No rashes or skin lesions Psychiatric: Negative; No behavioral problems, depression Sleep: Negative; No snoring, daytime sleepiness, hypersomnolence, bruxism, restless legs, hypnogognic hallucinations, no cataplexy Other comprehensive 14 point system review is negative.  PE BP 120/62  Pulse 93  Ht 5\' 2"  (1.575 m)  Wt 140 lb 8 oz (63.73 kg)  BMI 25.69 kg/m2  General: Alert, oriented, no distress.  Skin: normal turgor, blister or lesion on the left forearm with mild induration below this. HEENT: Normocephalic, atraumatic. Pupils round and reactive; sclera anicteric;no lid lag.  Nose without nasal septal hypertrophy Mouth/Parynx benign; Mallinpatti scale 3 Neck: No JVD, no carotid bruits;  normal carotid upstroke Chest wall: No tenderness to palpation Lungs: clear to ausculatation and percussion; no wheezing or rales Heart: Irregularly irregular rhythm with a ventricular rate in the 90's-100;  s1 s2 normal 1/6 systolic murmur; no  diastolic murmur Abdomen: soft, nontender; no hepatosplenomehaly, BS+; abdominal aorta nontender and not dilated by palpation. Back: No CVA tenderness Pulses 2+ Extremities: no clubbing cyanosis or edema, Homan's sign negative  Neurologic: grossly nonfocal Psychological: Normal affect and mood. Normal cognition.  ECG (independently read by me): Atrial fibrillation with a ventricular rate at 93.  Isolated PVC.  12/04/2013 ECG (independently read by me): Atrial fibrillation at 87 beats per minute.  Nonspecific ST-T changes.   Prior January 2015 ECG (independently read by me): Atrial fibrillation with ventricular response of 87. QTc interval 495 ms. when compared to her prior ECG atrial fibrillation rate has increased to 87 from 73 average rate.  LABS:  BMET    Component Value Date/Time   NA 132* 10/22/2013 1000   K 4.2 10/22/2013 1000   CL 92* 10/22/2013 1000   CO2 24 10/22/2013 1000   GLUCOSE 168* 10/22/2013 1000   BUN 13 10/22/2013 1000   CREATININE 0.78 10/22/2013 1000   CREATININE 1.27* 06/15/2013 1031   CALCIUM 9.1 10/22/2013 1000   GFRNONAA 73* 10/22/2013 1000   GFRNONAA 38* 06/15/2013 1031   GFRAA 85* 10/22/2013 1000   GFRAA 44* 06/15/2013 1031     Hepatic Function Panel     Component Value Date/Time   PROT 6.8 03/05/2013 1236   ALBUMIN 3.8 03/05/2013 1236   AST 31 03/05/2013 1236   ALT 37* 03/05/2013 1236   ALKPHOS 72 03/05/2013 1236   BILITOT 0.7 03/05/2013 1236   BILIDIR 0.2 02/20/2010 1139     CBC    Component Value Date/Time   WBC 9.2 01/27/2014 0735   RBC 4.75 01/27/2014 0735   HGB 14.7 01/27/2014 0735   HCT 44.5 01/27/2014 0735   PLT 230 01/27/2014 0735   MCV 93.7 01/27/2014 0735   MCH 30.9 01/27/2014 0735   MCHC 33.0  01/27/2014 0735   RDW 14.1 01/27/2014 0735   LYMPHSABS 1.2 01/27/2014 0735   MONOABS 0.8 01/27/2014 0735   EOSABS 0.1 01/27/2014 0735   BASOSABS 0.0 01/27/2014 0735     BNP    Component Value Date/Time   PROBNP 1789.0* 10/20/2013 1240    Lipid Panel  No results found for this basename: chol,  trig,  hdl,  cholhdl,  vldl,  ldlcalc     RADIOLOGY: No results found.    ASSESSMENT AND PLAN: Cheyenne [name]  is 20 years following her inferior wall myocardial infarction treated with PTCA the right coronary artery and 16 years status post successful CABG revascularization surgery. Her last nuclear perfusion study is unchanged from 2 years previously.  She now is in permanent atrial fibrillation and is on warfarin anticoagulation.   Ventricular rate is at times still increases to over 100.  She admits to being under increased stress with the recent loss of significant amount of money and via a scam.  Her blood pressure today is controlled.  She currently is on Toprol-XL 200 mg daily, Imdur 100 mg twice a day regimen, and I recommended she discontinue her Cardizem CD 240 mg, and we'll change this to Cardizem CD 300 mg.  Her blood pressure today is controlled with also Pravachol 10 mg in addition to furosemide 20 mg therapy.  There is no wheezing.  She takes Minerva Fester for her lung disease.  She denies any anginal symptoms with her established coronary artery disease.  She does have osteoarthritis.  I will see her in 3 months for followup evaluation or sooner as needed.  Troy Sine, MD, [facility]  04/14/2014 9:16 AM

## 2014-04-23 ENCOUNTER — Telehealth: Payer: Self-pay | Admitting: Cardiovascular Disease

## 2014-04-23 NOTE — Telephone Encounter (Addendum)
Spoke with pt, about one week ago dr Claiborne Billings increased her diltiazem to 300 mg once daily. Today her bp 90/48 pulse 76. She is having no orthostatic issues at all. She does admit to not drinking a lot. Medication reviewed, will discuss with laura ingold np

## 2014-04-23 NOTE — Telephone Encounter (Signed)
Please call her asap i,blood is 90/48.

## 2014-04-23 NOTE — Telephone Encounter (Signed)
Spoke with pt, per laura ingold np, the pt was instructed to hold lasix Saturday and stop ramipril If pts bo is greater than 110 on Sunday she will restart the lasix at previous dosage. She will keep track of her bp and let us know of any concerns.

## 2014-05-05 ENCOUNTER — Telehealth: Payer: Self-pay | Admitting: Cardiovascular Disease

## 2014-05-05 NOTE — Telephone Encounter (Signed)
Please call,she does not think the medicine is working.Her last reading for her blood pressure now was 131/94 and the pulse rate was 131.

## 2014-05-05 NOTE — Telephone Encounter (Signed)
Ok for anti anxiolytic; take extra metoprolol PRN with increased HR > 100.

## 2014-05-05 NOTE — Telephone Encounter (Signed)
Pt is still in atrial fib,blood pressure dropped to 110/60. Please call,have some questions.

## 2014-05-05 NOTE — Telephone Encounter (Signed)
Returned call to patient she stated she is concerned about elevated heart beat and low B/P.Stated when she woke up this morning B/P 110/64 pulse 125.Stated she took a extra metoprolol tart 50 mg.Stated B/P now 136/92,pulse 117.Stated she wants to know what she can do to keep her heart rate below 120 bpm.Stated she feels bad when her heartbeat is fast.Advised she can take a xanax.Message sent to [facility] for advice.

## 2014-05-06 NOTE — Telephone Encounter (Signed)
Spoke with pt, aware of dr [facility] recommendations. She reports she would have to take an extra metoprolol all the time. She reports now sitting in the chair at rest her heart rate is 124. She is taking toprol 100 mg bid, her cardizem was increased to 300 mg 04-13-14. Her bp this morning was 134/98. Will make dr Claiborne Billings aware.

## 2014-05-12 ENCOUNTER — Ambulatory Visit (INDEPENDENT_AMBULATORY_CARE_PROVIDER_SITE_OTHER): Payer: Medicare Other | Admitting: Pharmacist Clinician (PhC)/ Clinical Pharmacy Specialist

## 2014-05-12 VITALS — HR 120

## 2014-05-12 DIAGNOSIS — I4891 Unspecified atrial fibrillation: Secondary | ICD-10-CM

## 2014-05-12 LAB — POCT INR: INR: 1.5

## 2014-05-12 MED ORDER — METOPROLOL TARTRATE 50 MG PO TABS: ORAL_TABLET | ORAL | Status: DC

## 2014-05-12 NOTE — Progress Notes (Signed)
BP going from 93-968G systolic.  Currently taking metoprolol succ 100mg  bid, diltiazem 300mg  qd and prn metoprolol tart 50mg  - has taken most days in past week

## 2014-05-13 NOTE — Telephone Encounter (Signed)
Discussed with Cyril Mourning who was to notify the pt

## 2014-05-25 ENCOUNTER — Ambulatory Visit (INDEPENDENT_AMBULATORY_CARE_PROVIDER_SITE_OTHER): Payer: Medicare Other | Admitting: *Deleted

## 2014-05-25 ENCOUNTER — Ambulatory Visit (INDEPENDENT_AMBULATORY_CARE_PROVIDER_SITE_OTHER): Payer: Medicare Other | Admitting: Physician Assistant

## 2014-05-25 ENCOUNTER — Encounter: Payer: Self-pay | Admitting: Physician Assistant

## 2014-05-25 VITALS — BP 135/89 | HR 106 | Ht 62.0 in | Wt 145.3 lb

## 2014-05-25 DIAGNOSIS — I4891 Unspecified atrial fibrillation: Secondary | ICD-10-CM

## 2014-05-25 DIAGNOSIS — I480 Paroxysmal atrial fibrillation: Secondary | ICD-10-CM

## 2014-05-25 DIAGNOSIS — Z7901 Long term (current) use of anticoagulants: Secondary | ICD-10-CM

## 2014-05-25 DIAGNOSIS — I2510 Atherosclerotic heart disease of native coronary artery without angina pectoris: Secondary | ICD-10-CM

## 2014-05-25 DIAGNOSIS — I10 Essential (primary) hypertension: Secondary | ICD-10-CM

## 2014-05-25 LAB — POCT INR: INR: 2.6

## 2014-05-25 MED ORDER — DILTIAZEM HCL ER COATED BEADS 360 MG PO CP24: 360.0000 mg | ORAL_CAPSULE | Freq: Every day | ORAL | Status: DC

## 2014-05-25 NOTE — Assessment & Plan Note (Signed)
Blood pressure is just mildly elevated. I've increased her diltiazem to help control her A. fib

## 2014-05-25 NOTE — Patient Instructions (Signed)
Your physician has recommended you make the following change in your medication: The diltiazem has been increased to 360 mg. STOP THE LOPRESSOR. ( metoprolol tartrate)   The new diltiazem has been sent to your pharmacy.  Your physician recommends that you schedule a follow-up next available appointment with Dr. Claiborne Billings.

## 2014-05-25 NOTE — Progress Notes (Signed)
Date:  05/25/2014   ID:  De Nurse, DOB 09/05/1924, MRN [id number]  PCP:  Hayden Rasmussen., MD  Primary Cardiologist:  Claiborne Billings     History of Present Illness: Cheyenne [name] is a 80 y.o. female coronary artery disease. She suffered an inferior wall myocardial infarction in 1997 treated with PTCA of her RCA and ultimately underwent CABG surgery. She has a remote history of atrial flutter and is status post atrial flutter ablation. In She also has a history of hyperlipidemia, hypertension, as well as GERD On 01/29/2013 a 2 year follow-up nuclear study continued to show normal perfusion. Post-stress ejection fraction was 73%. There was no evidence for scar or ischemia.   She had a history of paroxysmal atrial fibrillation . She was readmitted to the hospital last October 2014 after Ms. Rodier developed another episode of atrial fibrillation with rapid ventricular response. She has been on chronic anticoagulation. She had taken additional doses of Lopressor prior to her evaluation. Her amiodarone dose was adjusted and ultimately she converted to sinus rhythm.however, she has developed recurrent atrial fibrillation is and now is in permanent atrial fibrillation.   She was hospitalized in March 2015 with pneumonia. She has required high-dose beta blockade and calcium channel blocker therapy for rate suppression of her atrial fibrillation.  Her heart rate when she gets stressed and increase over 100 despite being on Cardizem 300 mg and Toprol-XL 200 mg daily, taken as 100 mg twice a day regimen. She does have a history of hyperlipidemia and is on combination therapy with atorvastatin 80 mg and Zetia 10 mg. There is a history of COPD, for which he takes [place].  Patient presents today for followup of rapid atrial fibrillation. She was started on Lopressor 12.5 mg as needed for elevated heart rate this past July. Today she reports that she feels fine however, she is tired. When her heart rate increases she  feels even more fatigued and ill. This is limiting her activity she is unable to go outside and trim her bushes.   She currently denies nausea, vomiting, fever, chest pain, shortness of breath, orthopnea, dizziness, PND, cough, congestion, abdominal pain, hematochezia, melena, lower extremity edema.  Wt Readings from Last 3 Encounters:  05/25/14 145 lb 4.8 oz (65.908 kg)  04/13/14 140 lb 8 oz (63.73 kg)  03/04/14 142 lb (64.411 kg)     Past Medical History  Diagnosis Date  . Atrial fibrillation   . Coronary artery disease   . COPD (chronic obstructive pulmonary disease)   . Hypertension 11/04/2009    renal doppler showed abd aorta normal size  low velocities  w/ moderate amt plaque; rgt & lft kidney  normal size,renal arteries normal patency  . Anxiety   . Pneumonia     5-6 years ago  . Arthritis   . Myocardial infarction     Mild MI 1987,inferior wall  . Paroxysmal atrial fibrillation     last hospitalized July 2013  . Hyperlipidemia   . PVD (peripheral vascular disease) 09/18/2011    carotid doppler showed mild amount of fibrous plaque both carotid arteries w/o significant flow reduction or stenosis  . PVD (peripheral vascular disease) 09/18/2011    Lower extremity doppler showed ABIof 0.97 on the right,0.83 on the left.,50-60%narrowing rgt common iliac greater than 70% right SFA, right posterior tibial & peroneal arteries occlude  . PVD (peripheral vascular disease) 09/14/2010    ABI's in 2012 were 0.94 and 0.81 but velocities appeared more sigificantly changed  .  PVD (peripheral vascular disease) with claudication 09/15/2007    abd aotra moder. amt plaque w/low velocities, left SFA occluded vessel, rgt prox. & mid SFA elvated  velocities ABI rgt O.87,ABI  lft 0.77   . Peripheral angiopathy 03/21/2004    total left SFA w/three vessel runoff though tibial vessels are diffusely disease tx medically,left 70% distal lft external iliac,70% rgt common iliac, 50-60% prox and mid rgt SFA ,    . Diabetes mellitus without complication     Current Outpatient Prescriptions  Medication Sig Dispense Refill  . acetaminophen (TYLENOL) 500 MG tablet Take 1,000 mg by mouth every 6 (six) hours as needed. For pain      . ALPRAZolam (XANAX) 0.25 MG tablet Take 0.5 mg by mouth at bedtime. For anxiety      . atorvastatin (LIPITOR) 80 MG tablet Take 1 tablet (80 mg total) by mouth every evening.  90 tablet  3  . calcium carbonate (OS-CAL) 600 MG TABS Take 600 mg by mouth 2 (two) times daily with a meal.      . cilostazol (PLETAL) 100 MG tablet Take 1 tablet (100 mg total) by mouth 2 (two) times daily.  180 tablet  3  . dicyclomine (BENTYL) 10 MG capsule Take 10 mg by mouth 3 (three) times daily before meals.       Marland Kitchen esomeprazole (NEXIUM) 40 MG capsule Take 40 mg by mouth daily before breakfast.      . ezetimibe (ZETIA) 10 MG tablet Take 1 tablet (10 mg total) by mouth every evening.  90 tablet  3  . fluticasone (FLONASE) 50 MCG/ACT nasal spray Place 2 sprays into the nose daily as needed for allergies.       . furosemide (LASIX) 20 MG tablet Take 20 mg by mouth daily.       Marland Kitchen HYDROcodone-acetaminophen (NORCO/VICODIN) 5-325 MG per tablet Take 1 tablet by mouth every 8 (eight) hours as needed.      . isosorbide mononitrate (IMDUR) 30 MG 24 hr tablet Take 1 tablet (30 mg total) by mouth daily before breakfast.  90 tablet  3  . Levocetirizine Dihydrochloride (XYZAL PO) Take 10 mg by mouth every evening.      . metoprolol succinate (TOPROL-XL) 100 MG 24 hr tablet Take 1 tablet (100 mg total) by mouth 2 (two) times daily.  60 tablet  1  . nitroGLYCERIN (NITROSTAT) 0.4 MG SL tablet Place 1 tablet (0.4 mg total) under the tongue every 5 (five) minutes as needed for chest pain.  25 tablet  9  . Omega-3 Fatty Acids (FISH OIL) 1200 MG CAPS Take 1 capsule by mouth 2 (two) times daily.      . potassium chloride SA (K-DUR,KLOR-CON) 20 MEQ tablet Take 20 mEq by mouth 2 (two) times daily.       . psyllium  (METAMUCIL) 58.6 % powder Take 1 packet by mouth daily. At lunch or supper      . ramipril (ALTACE) 10 MG capsule Take 1 capsule (10 mg total) by mouth daily.  90 capsule  3  . Tiotropium Bromide Monohydrate (SPIRIVA HANDIHALER IN) Inhale 1 capsule into the lungs daily.       Marland Kitchen warfarin (COUMADIN) 5 MG tablet Take 1 tablet by mouth daily or as directed  90 tablet  1  . diltiazem (CARDIZEM CD) 360 MG 24 hr capsule Take 1 capsule (360 mg total) by mouth daily.  90 capsule  0   No current facility-administered medications for this visit.  Allergies:    Allergies  Allergen Reactions  . Cephalexin Diarrhea  . Elavil [Amitriptyline] Other (See Comments)    Felt drunk  . Nifedipine Other (See Comments)    Heart races  . Tegretol [Carbamazepine] Other (See Comments)    Felt drunk    Social History:  The patient  reports that she quit smoking about 16 years ago. Her smoking use included Cigarettes. She smoked 1.50 packs per day. She has never used smokeless tobacco. She reports that she does not drink alcohol or use illicit drugs.   Family history:   Family History  Problem Relation Age of Onset  . Heart disease Father   . Breast cancer Paternal Aunt     ROS:  Please see the history of present illness.  All other systems reviewed and negative.   PHYSICAL EXAM: VS:  BP 135/89  Pulse 106  Ht 5\' 2"  (1.575 m)  Wt 145 lb 4.8 oz (65.908 kg)  BMI 26.57 kg/m2 Well nourished, well developed, in no acute distress HEENT: Pupils are equal round react to light accommodation extraocular movements are intact.  Neck: no JVDNo cervical lymphadenopathy. Cardiac: Irregular rate and rhythm without murmurs. Lungs:  clear to auscultation bilaterally, no wheezing, rhonchi or rales Ext: no lower extremity edema.  2+ radial and dorsalis pedis pulses. Skin: warm and dry Neuro:  Grossly normal  EKG:  Atrial fibrillation with a rate of 106 beats per minute   ASSESSMENT AND PLAN:  Problem List Items  Addressed This Visit   Atrial fibrillation; permanent - Primary     The patient has been experiencing atrial fibrillation with rapid ventricular response. She was recently admitted on as needed Lopressor. Reports her heart rate is more elevated in the morning but not necessarily always at that time. Not sure what is certainly driving her elevated heart rate we'll try increasing her diltiazem to 360 mg and asking her to stop the portable per trach she's been taking just about every morning.  She will follow up with Dr. Claiborne Billings. She is anticoagulated with Coumadin.    Relevant Medications      diltiazem (CARDIZEM CD) 24 hr capsule   Other Relevant Orders      EKG 12-Lead   Essential hypertension     Blood pressure is just mildly elevated. I've increased her diltiazem to help control her A. fib    Relevant Medications      diltiazem (CARDIZEM CD) 24 hr capsule   Long term current use of anticoagulant therapy

## 2014-05-25 NOTE — Assessment & Plan Note (Signed)
The patient has been experiencing atrial fibrillation with rapid ventricular response. She was recently admitted on as needed Lopressor. Reports her heart rate is more elevated in the morning but not necessarily always at that time. Not sure what is certainly driving her elevated heart rate we'll try increasing her diltiazem to 360 mg and asking her to stop the portable per trach she's been taking just about every morning.  She will follow up with Dr. Claiborne Billings. She is anticoagulated with Coumadin.

## 2014-05-26 ENCOUNTER — Ambulatory Visit: Payer: Medicare Other | Admitting: Pharmacist Clinician (PhC)/ Clinical Pharmacy Specialist

## 2014-06-03 ENCOUNTER — Telehealth: Payer: Self-pay | Admitting: Cardiovascular Disease

## 2014-06-03 NOTE — Telephone Encounter (Signed)
Returned a call to patient. She feels the medication changes that was made at her last appointment with Tarri Fuller is not working. Appointment given to see Dr. Claiborne Billings Next Tuesday 06/08/14. If patient experiences any problems before that time she is to call back to advise or seek medical attention. Patient voiced her understanding of this advice.

## 2014-06-03 NOTE — Telephone Encounter (Signed)
Please call,she does not think the new medicine that Gaspar Bidding put her in is working as well. She wants to go back to the medicine that Dr Claiborne Billings had her on.She will give you the details when you call.

## 2014-06-07 ENCOUNTER — Encounter: Payer: Self-pay | Admitting: Physician Assistant

## 2014-06-08 ENCOUNTER — Ambulatory Visit (INDEPENDENT_AMBULATORY_CARE_PROVIDER_SITE_OTHER): Payer: Medicare Other | Admitting: *Deleted

## 2014-06-08 ENCOUNTER — Encounter: Payer: Self-pay | Admitting: Cardiovascular Disease

## 2014-06-08 ENCOUNTER — Ambulatory Visit (INDEPENDENT_AMBULATORY_CARE_PROVIDER_SITE_OTHER): Payer: Medicare Other | Admitting: Cardiovascular Disease

## 2014-06-08 VITALS — BP 120/80 | HR 108 | Ht 62.0 in | Wt 142.0 lb

## 2014-06-08 DIAGNOSIS — Z7901 Long term (current) use of anticoagulants: Secondary | ICD-10-CM

## 2014-06-08 DIAGNOSIS — Z79899 Other long term (current) drug therapy: Secondary | ICD-10-CM

## 2014-06-08 DIAGNOSIS — I4891 Unspecified atrial fibrillation: Secondary | ICD-10-CM

## 2014-06-08 DIAGNOSIS — I2510 Atherosclerotic heart disease of native coronary artery without angina pectoris: Secondary | ICD-10-CM

## 2014-06-08 DIAGNOSIS — E785 Hyperlipidemia, unspecified: Secondary | ICD-10-CM

## 2014-06-08 DIAGNOSIS — I480 Paroxysmal atrial fibrillation: Secondary | ICD-10-CM

## 2014-06-08 LAB — POCT INR: INR: 2.1

## 2014-06-08 MED ORDER — DIGOXIN 125 MCG PO TABS: 0.1250 mg | ORAL_TABLET | Freq: Every day | ORAL | Status: DC

## 2014-06-08 NOTE — Patient Instructions (Addendum)
Your INR today was 2.1 Please continue your current dose of coumadin  Please schedule your next INR check with Erasmo Downer in 4 weeks.   Your physician has recommended you make the following change in your medication: start new prescription for lanoxin . Take 2 tablets today and tomorrow and then 1 tablet daily thereafter. This has been sent to your pharmacy.  Your physician recommends that you return for lab work in: 2-3 weeks.  Your physician recommends that you schedule a follow-up appointment in: 4 weeks with Dr. Claiborne Billings.

## 2014-06-08 NOTE — Progress Notes (Signed)
Patient ID: De Nurse, female   DOB: 1925-02-19, 80 y.o.   MRN: [id number]     HPI: Cheyenne [name] is a 80 y.o. female who presents to the office today for a 2 month cardiology evaluation.  Cheyenne [name] has known coronary artery disease and suffered an inferior wall myocardial infarction in 1997 treated with PTCA of her RCA and ultimately underwent CABG surgery. She has a remote history of atrial flutter and is status post atrial flutter ablation. She also has a history of hyperlipidemia, hypertension, as well as GERD  On 01/29/2013 a 2 year follow-up nuclear study continued to show normal perfusion. Post-stress ejection fraction was 73%. There was no evidence for scar or ischemia.  She had a history of paroxysmal atrial fibrillation . She was readmitted to the hospital last October 2014 with  atrial fibrillation with rapid ventricular response. She has been on chronic anticoagulation. She had taken additional doses of Lopressor prior to her evaluation. Her amiodarone dose was adjusted and ultimately she converted to sinus rhythm.  She has developed recurrent atrial fibrillation is and now is in permanent atrial fibrillation.  She was hospitalized in March  2015 with pneumonia.  She has required high-dose beta blockade and calcium channel blocker therapy for rate suppression of her atrial fibrillation.  When I saw her 6 months ago, she was  under increased stress.  Unfortunately, her daughter lost a significant amount of her money in an Internet scam.  Her heart rate when she gets stressed and increase over 100 despite being on Cardizem 240 mg and Toprol-XL 200 mg daily, and a 100 mg twice a day regimen.  And I last saw her 2 months ago.  I further titrated her Cardizem CD to 300 mg for improved rate control.  She subsequently saw Tarri Fuller 3 weeks ago and he further titrated her Cardizem to 360 mg daily. She also is on Altace 10 mg for hypertension and takes furosemide 20 mg.  She denies any recent leg  swelling.  She does have a history of hyperlipidemia and is on combination therapy with atorvastatin 80 mg and Zetia 10 mg.  There is a history of COPD. She presents now for follow-up evaluation.   Past Medical History  Diagnosis Date  . Atrial fibrillation   . Coronary artery disease   . COPD (chronic obstructive pulmonary disease)   . Hypertension 11/04/2009    renal doppler showed abd aorta normal size  low velocities  w/ moderate amt plaque; rgt & lft kidney  normal size,renal arteries normal patency  . Anxiety   . Pneumonia     5-6 years ago  . Arthritis   . Myocardial infarction     Mild MI 1987,inferior wall  . Paroxysmal atrial fibrillation     last hospitalized July 2013  . Hyperlipidemia   . PVD (peripheral vascular disease) 09/18/2011    carotid doppler showed mild amount of fibrous plaque both carotid arteries w/o significant flow reduction or stenosis  . PVD (peripheral vascular disease) 09/18/2011    Lower extremity doppler showed ABIof 0.97 on the right,0.83 on the left.,50-60%narrowing rgt common iliac greater than 70% right SFA, right posterior tibial & peroneal arteries occlude  . PVD (peripheral vascular disease) 09/14/2010    ABI's in 2012 were 0.94 and 0.81 but velocities appeared more sigificantly changed  . PVD (peripheral vascular disease) with claudication 09/15/2007    abd aotra moder. amt plaque w/low velocities, left SFA occluded vessel, rgt prox. & mid SFA  elvated  velocities ABI rgt O.87,ABI  lft 0.77   . Peripheral angiopathy 03/21/2004    total left SFA w/three vessel runoff though tibial vessels are diffusely disease tx medically,left 70% distal lft external iliac,70% rgt common iliac, 50-60% prox and mid rgt SFA ,  . Diabetes mellitus without complication     Past Surgical History  Procedure Laterality Date  . Nasal sinus surgery    . Quadruple  bypass    . Cholecystectomy    . Eye surgery      bilateral catracts  . Coronary angioplasty  1987     emergent,60% LAD  . Coronary artery bypass graft  03/23/1998    CABGx4 left internal mammary artery to LAD, SVG to diagonal branch to LAD, first obtuse branch Left Circ,and posterior descending branch of RCA  . Doppler echocardiography  05/29/2012    proximal septal thickening w/normal systolic and probable grade 2 diastolic dysfunction,mod LA dilatation, mild  annilar calcifcation w/mild to mod MR, mild to mod.TR , mild pulm. HTN w/upper normal pulm. pressures. mild aortic sclerosis w/o stenosis and pulm.valvular regrurgitation  . Doppler echocardiography  04/16/2011    EF =>55%, LV normal,Grade II diastolic dysfunction, aortic valve moderately sclerotic  . Doppler echocardiography  06/16/2009    EF => 55%,LV normal ,mild to moder. mitral annular calcifcation,no aortic stenosis, aortic valve appears moder.sclerotic  . Cardiac stress test  11/09/2010    EF 75%, small area of reversible lateral ischemia. SDS 2 . Extent 3%  . Cardiac stress test  11/04/2009    EF 75%, LV normal ,   . Cardiac catheterization  03/22/1998    severe four vessel coronary disease normal LV function  . Atrial flutter ablation  11/11/1996    Dr Caryl Comes did RFablation atrial flutter  and sinus node re-entry tachycardia  . Cardioversion N/A 06/19/2013    Procedure: CARDIOVERSION;  Surgeon: Troy Sine, MD;  Location: [facility] ENDOSCOPY;  Service: Cardiovascular;  Laterality: N/A;    Allergies  Allergen Reactions  . Cephalexin Diarrhea  . Elavil [Amitriptyline] Other (See Comments)    Felt drunk  . Nifedipine Other (See Comments)    Heart races  . Tegretol [Carbamazepine] Other (See Comments)    Felt drunk    Current Outpatient Prescriptions  Medication Sig Dispense Refill  . acetaminophen (TYLENOL) 500 MG tablet Take 1,000 mg by mouth every 6 (six) hours as needed. For pain    . ALPRAZolam (XANAX) 0.25 MG tablet Take 0.5 mg by mouth at bedtime. For anxiety    . atorvastatin (LIPITOR) 80 MG tablet Take 1 tablet (80  mg total) by mouth every evening. 90 tablet 3  . calcium carbonate (OS-CAL) 600 MG TABS Take 600 mg by mouth 2 (two) times daily with a meal.    . cilostazol (PLETAL) 100 MG tablet Take 1 tablet (100 mg total) by mouth 2 (two) times daily. 180 tablet 3  . dicyclomine (BENTYL) 10 MG capsule Take 10 mg by mouth 3 (three) times daily before meals.     Marland Kitchen diltiazem (CARDIZEM CD) 360 MG 24 hr capsule Take 1 capsule (360 mg total) by mouth daily. 90 capsule 0  . esomeprazole (NEXIUM) 40 MG capsule Take 40 mg by mouth daily before breakfast.    . ezetimibe (ZETIA) 10 MG tablet Take 1 tablet (10 mg total) by mouth every evening. 90 tablet 3  . fluticasone (FLONASE) 50 MCG/ACT nasal spray Place 2 sprays into the nose daily as needed for allergies.     Marland Kitchen  furosemide (LASIX) 20 MG tablet Take 20 mg by mouth daily.     Marland Kitchen HYDROcodone-acetaminophen (NORCO/VICODIN) 5-325 MG per tablet Take 1 tablet by mouth every 8 (eight) hours as needed.    . isosorbide mononitrate (IMDUR) 30 MG 24 hr tablet Take 1 tablet (30 mg total) by mouth daily before breakfast. 90 tablet 3  . Levocetirizine Dihydrochloride (XYZAL PO) Take 10 mg by mouth every evening.    . metoprolol succinate (TOPROL-XL) 100 MG 24 hr tablet Take 1 tablet (100 mg total) by mouth 2 (two) times daily. 60 tablet 1  . nitroGLYCERIN (NITROSTAT) 0.4 MG SL tablet Place 1 tablet (0.4 mg total) under the tongue every 5 (five) minutes as needed for chest pain. 25 tablet 9  . Omega-3 Fatty Acids (FISH OIL) 1200 MG CAPS Take 1 capsule by mouth 2 (two) times daily.    . potassium chloride SA (K-DUR,KLOR-CON) 20 MEQ tablet Take 20 mEq by mouth 2 (two) times daily.     . psyllium (METAMUCIL) 58.6 % powder Take 1 packet by mouth daily. At lunch or supper    . ramipril (ALTACE) 10 MG capsule Take 1 capsule (10 mg total) by mouth daily. 90 capsule 3  . Tiotropium Bromide Monohydrate (SPIRIVA HANDIHALER IN) Inhale 1 capsule into the lungs daily.     Marland Kitchen warfarin (COUMADIN) 5  MG tablet Take 1 tablet by mouth daily or as directed 90 tablet 1   No current facility-administered medications for this visit.    Socially she is widowed for 4 years. She has 3 children and one grandchild. She has a remote tobacco history but quit in 1999. There is no alcohol use. She was born in [place]. She moved to [place] in [place] after [place]: Negative; No fevers, chills, or night sweats; no weight change HEENT: Negative; No changes in vision or hearing, sinus congestion, difficulty swallowing Pulmonary: Recent hospitalization with pneumonia No cough, wheezing, shortness of breath, hemoptysis Cardiovascular: Negative; No chest pain, presyncope, syncope, palpatations GI: Recent irritable bowel syndrome flare up; Currently no nausea, vomiting, diarrhea, or abdominal pain GU: Negative; No dysuria, hematuria, or difficulty voiding Musculoskeletal: Intermittent knee discomfort; and right leg discomfort.  She has osteoarthritis. Hematologic/Oncology: Negative; no easy bruising, bleeding Endocrine: Negative; no heat/cold intolerance; she has a history of glucose intolerance Neuro: Negative; no changes in balance, headaches Skin: Negative; No rashes or skin lesions Psychiatric: Negative; No behavioral problems, depression Sleep: Negative; No snoring, daytime sleepiness, hypersomnolence, bruxism, restless legs, hypnogognic hallucinations, no cataplexy Other comprehensive 14 point system review is negative.  PE BP 120/80 mmHg  Pulse 108  Ht 5\' 2"  (1.575 m)  Wt 142 lb (64.411 kg)  BMI 25.97 kg/m2  General: Alert, oriented, no distress.  Skin: normal turgor, blister or lesion on the left forearm with mild induration below this. HEENT: Normocephalic, atraumatic. Pupils round and reactive; sclera anicteric;no lid lag.  Nose without nasal septal hypertrophy Mouth/Parynx benign; Mallinpatti scale 3 Neck: No JVD, no carotid bruits; normal carotid upstroke Chest wall: No  tenderness to palpation Lungs: clear to ausculatation and percussion; no wheezing or rales Heart: Irregularly irregular rhythm with a ventricular rate  ~100;  s1 s2 normal 1/6 systolic murmur; no diastolic murmur Abdomen: soft, nontender; no hepatosplenomehaly, BS+; abdominal aorta nontender and not dilated by palpation. Back: No CVA tenderness Pulses 2+ Extremities: no clubbing cyanosis or edema, Homan's sign negative  Neurologic: grossly nonfocal Psychological: Normal affect and mood. Normal cognition.  ECG (independently read by  me): atrial fibrillation with a ventricular rate at 10 8 bpm.  RV conduction delay.  Poor anterior R-wave progression.  September 2015 ECG (independently read by me): Atrial fibrillation with a ventricular rate at 93.  Isolated PVC.  12/04/2013 ECG (independently read by me): Atrial fibrillation at 87 beats per minute.  Nonspecific ST-T changes.   Prior January 2015 ECG (independently read by me): Atrial fibrillation with ventricular response of 87. QTc interval 495 ms. when compared to her prior ECG atrial fibrillation rate has increased to 87 from 73 average rate.  LABS:  BMET    Component Value Date/Time   NA 132* 10/22/2013 1000   K 4.2 10/22/2013 1000   CL 92* 10/22/2013 1000   CO2 24 10/22/2013 1000   GLUCOSE 168* 10/22/2013 1000   BUN 13 10/22/2013 1000   CREATININE 0.78 10/22/2013 1000   CREATININE 1.27* 06/15/2013 1031   CALCIUM 9.1 10/22/2013 1000   GFRNONAA 73* 10/22/2013 1000   GFRNONAA 38* 06/15/2013 1031   GFRAA 85* 10/22/2013 1000   GFRAA 44* 06/15/2013 1031     Hepatic Function Panel     Component Value Date/Time   PROT 6.8 03/05/2013 1236   ALBUMIN 3.8 03/05/2013 1236   AST 31 03/05/2013 1236   ALT 37* 03/05/2013 1236   ALKPHOS 72 03/05/2013 1236   BILITOT 0.7 03/05/2013 1236   BILIDIR 0.2 02/20/2010 1139     CBC    Component Value Date/Time   WBC 9.2 01/27/2014 0735   RBC 4.75 01/27/2014 0735   HGB 14.7 01/27/2014  0735   HCT 44.5 01/27/2014 0735   PLT 230 01/27/2014 0735   MCV 93.7 01/27/2014 0735   MCH 30.9 01/27/2014 0735   MCHC 33.0 01/27/2014 0735   RDW 14.1 01/27/2014 0735   LYMPHSABS 1.2 01/27/2014 0735   MONOABS 0.8 01/27/2014 0735   EOSABS 0.1 01/27/2014 0735   BASOSABS 0.0 01/27/2014 0735     BNP    Component Value Date/Time   PROBNP 1789.0* 10/20/2013 1240    Lipid Panel  No results found for: CHOL   RADIOLOGY: No results found.    ASSESSMENT AND PLAN: Cheyenne [name]  is 18 years following her inferior wall myocardial infarction treated with PTCA of her RCA and 16 years status post successful CABG revascularization surgery. Her last nuclear perfusion study is unchanged from 2 years previously.  She  has  permanent atrial fibrillation and is on warfarin anticoagulation. Her INR today was checked and she is therapeutic at 2.1.  Ventricular rate is at times still increases to over 100.  She admits to being under increased stress with the recent loss of significant amount of money  via a scam.  Her blood pressure today is controlled.  She currently is on Toprol-XL 200 mg daily in addition to Cardizem 360 mg and despite this, her ventricular rate is still fast.  I am electing to add Lanoxin and she will take 0.25 mg today and tomorrow and then 0.125 mg daily.  She's not having any anginal symptoms and is on low-dose isosorbide mononitrate 30 mg as well as.  Her blood pressure is stable with also the addition of ramipril10 mg.  She continues to tolerate her atorvastatin 80 mg and fish oil for hyperlipidemia. Laboratory will be checked in 2-3 weeks.  I will see her in 4 weeks for reevaluation.  Troy Sine, MD, [facility]  06/08/2014 11:44 AM

## 2014-06-09 ENCOUNTER — Ambulatory Visit: Payer: Medicare Other | Admitting: Pharmacist Clinician (PhC)/ Clinical Pharmacy Specialist

## 2014-06-09 DIAGNOSIS — E785 Hyperlipidemia, unspecified: Secondary | ICD-10-CM | POA: Insufficient documentation

## 2014-06-23 LAB — CBC
HCT: 40.8 % (ref 36.0–46.0)
Hemoglobin: 14.1 g/dL (ref 12.0–15.0)
MCH: 30.7 pg (ref 26.0–34.0)
MCHC: 34.6 g/dL (ref 30.0–36.0)
MCV: 88.7 fL (ref 78.0–100.0)
Platelets: 287 10*3/uL (ref 150–400)
RBC: 4.6 MIL/uL (ref 3.87–5.11)
RDW: 13.4 % (ref 11.5–15.5)
WBC: 10.4 10*3/uL (ref 4.0–10.5)

## 2014-06-23 LAB — COMPREHENSIVE METABOLIC PANEL
ALK PHOS: 84 U/L (ref 39–117)
ALT: 8 U/L (ref 0–35)
AST: 12 U/L (ref 0–37)
Albumin: 3.8 g/dL (ref 3.5–5.2)
BILIRUBIN TOTAL: 0.7 mg/dL (ref 0.2–1.2)
BUN: 10 mg/dL (ref 6–23)
CO2: 26 mEq/L (ref 19–32)
Calcium: 9.1 mg/dL (ref 8.4–10.5)
Chloride: 102 mEq/L (ref 96–112)
Creat: 0.96 mg/dL (ref 0.50–1.10)
Glucose, Bld: 139 mg/dL — ABNORMAL HIGH (ref 70–99)
Potassium: 4.2 mEq/L (ref 3.5–5.3)
SODIUM: 139 meq/L (ref 135–145)
TOTAL PROTEIN: 7 g/dL (ref 6.0–8.3)

## 2014-06-23 LAB — LIPID PANEL
CHOL/HDL RATIO: 2.4 ratio
Cholesterol: 101 mg/dL (ref 0–200)
HDL: 42 mg/dL (ref >39)
LDL Cholesterol: 32 mg/dL (ref 0–99)
Triglycerides: 133 mg/dL (ref <150)
VLDL: 27 mg/dL (ref 0–40)

## 2014-06-23 LAB — TSH: TSH: 1.887 u[IU]/mL (ref 0.350–4.500)

## 2014-06-23 LAB — DIGOXIN LEVEL: Digoxin Level: 2.4 ng/mL — ABNORMAL HIGH (ref 0.8–2.0)

## 2014-06-23 LAB — MAGNESIUM: MAGNESIUM: 1.6 mg/dL (ref 1.5–2.5)

## 2014-07-06 ENCOUNTER — Ambulatory Visit: Payer: Medicare Other | Admitting: Cardiovascular Disease

## 2014-07-07 ENCOUNTER — Ambulatory Visit (INDEPENDENT_AMBULATORY_CARE_PROVIDER_SITE_OTHER): Payer: Medicare Other | Admitting: Cardiovascular Disease

## 2014-07-07 ENCOUNTER — Encounter: Payer: Self-pay | Admitting: Cardiovascular Disease

## 2014-07-07 ENCOUNTER — Ambulatory Visit (INDEPENDENT_AMBULATORY_CARE_PROVIDER_SITE_OTHER): Payer: Medicare Other | Admitting: Pharmacist Clinician (PhC)/ Clinical Pharmacy Specialist

## 2014-07-07 VITALS — BP 102/56 | HR 60 | Ht 62.0 in | Wt 142.8 lb

## 2014-07-07 DIAGNOSIS — Z7901 Long term (current) use of anticoagulants: Secondary | ICD-10-CM

## 2014-07-07 DIAGNOSIS — F411 Generalized anxiety disorder: Secondary | ICD-10-CM

## 2014-07-07 DIAGNOSIS — I2510 Atherosclerotic heart disease of native coronary artery without angina pectoris: Secondary | ICD-10-CM

## 2014-07-07 DIAGNOSIS — I480 Paroxysmal atrial fibrillation: Secondary | ICD-10-CM

## 2014-07-07 DIAGNOSIS — I4891 Unspecified atrial fibrillation: Secondary | ICD-10-CM

## 2014-07-07 DIAGNOSIS — E785 Hyperlipidemia, unspecified: Secondary | ICD-10-CM

## 2014-07-07 DIAGNOSIS — I2583 Coronary atherosclerosis due to lipid rich plaque: Secondary | ICD-10-CM

## 2014-07-07 LAB — POCT INR: INR: 3.3

## 2014-07-07 MED ORDER — DILTIAZEM HCL ER COATED BEADS 300 MG PO CP24: 300.0000 mg | ORAL_CAPSULE | Freq: Every day | ORAL | Status: AC

## 2014-07-07 MED ORDER — DIGOXIN 125 MCG PO TABS: ORAL_TABLET | ORAL | Status: DC

## 2014-07-07 NOTE — Progress Notes (Signed)
Patient ID: De Nurse, female   DOB: [birth year], 80 y.o.   MRN: [id number]     HPI: Cheyenne [name] is a 80 y.o. female who presents to the office today for a follow-up cardiology evaluation.  Cheyenne [name] has known coronary artery disease and suffered an inferior wall myocardial infarction in 1997 treated with PTCA of her RCA and ultimately underwent CABG surgery. She has a remote history of atrial flutter and is status post atrial flutter ablation. She also has a history of hyperlipidemia, hypertension, as well as GERD  On 01/29/2013 a 2 year follow-up nuclear study continued to show normal perfusion. Post-stress ejection fraction was 73%. There was no evidence for scar or ischemia.  She had a history of paroxysmal atrial fibrillation . She was readmitted to the hospital last October 2014 with  atrial fibrillation with rapid ventricular response. She has been on chronic anticoagulation. She had taken additional doses of Lopressor prior to her evaluation. Her amiodarone dose was adjusted and ultimately she converted to sinus rhythm.  She has developed recurrent atrial fibrillation is and now is in permanent atrial fibrillation.  She was hospitalized in March  2015 with pneumonia.  She has required high-dose beta blockade and calcium channel blocker therapy for rate suppression of her atrial fibrillation.  When I saw her 6 months ago, she was  under increased stress.  Unfortunately, her daughter lost a significant amount of her money in an Internet scam.  Her heart rate when she gets stressed and increase over 100 despite being on Cardizem 240 mg and Toprol-XL 200 mg daily, and a 100 mg twice a day regimen.  Over the last several office visits, her Cardizem was titrated to 300 mg and then subsequently 360 mg without significant benefit for additional rate control.  When I last saw her one month ago, she was still tachycardic and I recommended the addition of digoxin to take 0.25 mg for the first day and then  0.125 mg daily.  With this adjustment, she has noticed a dramatic improvement in her ventricular rate, which had been continuously greater than 100 bpm.  She states now it predominately is in the 60s, but occasionally her heart rate has dropped into the upper 40s.  She recently had laboratory which revealed a digoxin level of 2.4.  She presents now for follow-up evaluation.  She denies any recent chest pain.  There is no PND or orthopnea.  She denies any recent leg swelling.  She does have a history of hyperlipidemia and is on combination therapy with atorvastatin 80 mg and Zetia 10 mg.  There is a history of COPD. She presents now for follow-up evaluation.   Past Medical History  Diagnosis Date  . Atrial fibrillation   . Coronary artery disease   . COPD (chronic obstructive pulmonary disease)   . Hypertension 11/04/2009    renal doppler showed abd aorta normal size  low velocities  w/ moderate amt plaque; rgt & lft kidney  normal size,renal arteries normal patency  . Anxiety   . Pneumonia     5-6 years ago  . Arthritis   . Myocardial infarction     Mild MI 1987,inferior wall  . Paroxysmal atrial fibrillation     last hospitalized July 2013  . Hyperlipidemia   . PVD (peripheral vascular disease) 09/18/2011    carotid doppler showed mild amount of fibrous plaque both carotid arteries w/o significant flow reduction or stenosis  . PVD (peripheral vascular disease) 09/18/2011  Lower extremity doppler showed ABIof 0.97 on the right,0.83 on the left.,50-60%narrowing rgt common iliac greater than 70% right SFA, right posterior tibial & peroneal arteries occlude  . PVD (peripheral vascular disease) 09/14/2010    ABI's in 2012 were 0.94 and 0.81 but velocities appeared more sigificantly changed  . PVD (peripheral vascular disease) with claudication 09/15/2007    abd aotra moder. amt plaque w/low velocities, left SFA occluded vessel, rgt prox. & mid SFA elvated  velocities ABI rgt O.87,ABI  lft  0.77   . Peripheral angiopathy 03/21/2004    total left SFA w/three vessel runoff though tibial vessels are diffusely disease tx medically,left 70% distal lft external iliac,70% rgt common iliac, 50-60% prox and mid rgt SFA ,  . Diabetes mellitus without complication     Past Surgical History  Procedure Laterality Date  . Nasal sinus surgery    . Quadruple  bypass    . Cholecystectomy    . Eye surgery      bilateral catracts  . Coronary angioplasty  1987    emergent,60% LAD  . Coronary artery bypass graft  03/23/1998    CABGx4 left internal mammary artery to LAD, SVG to diagonal branch to LAD, first obtuse branch Left Circ,and posterior descending branch of RCA  . Doppler echocardiography  05/29/2012    proximal septal thickening w/normal systolic and probable grade 2 diastolic dysfunction,mod LA dilatation, mild  annilar calcifcation w/mild to mod MR, mild to mod.TR , mild pulm. HTN w/upper normal pulm. pressures. mild aortic sclerosis w/o stenosis and pulm.valvular regrurgitation  . Doppler echocardiography  04/16/2011    EF =>55%, LV normal,Grade II diastolic dysfunction, aortic valve moderately sclerotic  . Doppler echocardiography  06/16/2009    EF => 55%,LV normal ,mild to moder. mitral annular calcifcation,no aortic stenosis, aortic valve appears moder.sclerotic  . Cardiac stress test  11/09/2010    EF 75%, small area of reversible lateral ischemia. SDS 2 . Extent 3%  . Cardiac stress test  11/04/2009    EF 75%, LV normal ,   . Cardiac catheterization  03/22/1998    severe four vessel coronary disease normal LV function  . Atrial flutter ablation  11/11/1996    Dr Caryl Comes did RFablation atrial flutter  and sinus node re-entry tachycardia  . Cardioversion N/A 06/19/2013    Procedure: CARDIOVERSION;  Surgeon: Troy Sine, MD;  Location: [facility] ENDOSCOPY;  Service: Cardiovascular;  Laterality: N/A;    Allergies  Allergen Reactions  . Cephalexin Diarrhea  . Elavil [Amitriptyline]  Other (See Comments)    Felt drunk  . Nifedipine Other (See Comments)    Heart races  . Tegretol [Carbamazepine] Other (See Comments)    Felt drunk    Current Outpatient Prescriptions  Medication Sig Dispense Refill  . acetaminophen (TYLENOL) 500 MG tablet Take 1,000 mg by mouth every 6 (six) hours as needed. For pain    . ALPRAZolam (XANAX) 0.25 MG tablet Take 0.5 mg by mouth at bedtime. For anxiety    . atorvastatin (LIPITOR) 80 MG tablet Take 1 tablet (80 mg total) by mouth every evening. 90 tablet 3  . calcium carbonate (OS-CAL) 600 MG TABS Take 600 mg by mouth 2 (two) times daily with a meal.    . cilostazol (PLETAL) 100 MG tablet Take 1 tablet (100 mg total) by mouth 2 (two) times daily. 180 tablet 3  . dicyclomine (BENTYL) 10 MG capsule Take 10 mg by mouth 3 (three) times daily before meals.     Marland Kitchen  digoxin (LANOXIN) 0.125 MG tablet Take 1 tablet (0.125 mg total) by mouth daily. 34 tablet 3  . diltiazem (CARDIZEM CD) 360 MG 24 hr capsule Take 1 capsule (360 mg total) by mouth daily. 90 capsule 0  . esomeprazole (NEXIUM) 40 MG capsule Take 40 mg by mouth daily before breakfast.    . ezetimibe (ZETIA) 10 MG tablet Take 1 tablet (10 mg total) by mouth every evening. 90 tablet 3  . fluticasone (FLONASE) 50 MCG/ACT nasal spray Place 2 sprays into the nose daily as needed for allergies.     . furosemide (LASIX) 20 MG tablet Take 20 mg by mouth daily.     Marland Kitchen HYDROcodone-acetaminophen (NORCO/VICODIN) 5-325 MG per tablet Take 1 tablet by mouth every 8 (eight) hours as needed.    . isosorbide mononitrate (IMDUR) 30 MG 24 hr tablet Take 1 tablet (30 mg total) by mouth daily before breakfast. 90 tablet 3  . Levocetirizine Dihydrochloride (XYZAL PO) Take 10 mg by mouth every evening.    . metoprolol succinate (TOPROL-XL) 100 MG 24 hr tablet Take 1 tablet (100 mg total) by mouth 2 (two) times daily. 60 tablet 1  . nitroGLYCERIN (NITROSTAT) 0.4 MG SL tablet Place 1 tablet (0.4 mg total) under the  tongue every 5 (five) minutes as needed for chest pain. 25 tablet 9  . Omega-3 Fatty Acids (FISH OIL) 1200 MG CAPS Take 1 capsule by mouth 2 (two) times daily.    . potassium chloride SA (K-DUR,KLOR-CON) 20 MEQ tablet Take 20 mEq by mouth 2 (two) times daily.     . psyllium (METAMUCIL) 58.6 % powder Take 1 packet by mouth daily. At lunch or supper    . ramipril (ALTACE) 10 MG capsule Take 1 capsule (10 mg total) by mouth daily. 90 capsule 3  . Tiotropium Bromide Monohydrate (SPIRIVA HANDIHALER IN) Inhale 1 capsule into the lungs daily.     Marland Kitchen warfarin (COUMADIN) 5 MG tablet Take 1 tablet by mouth daily or as directed 90 tablet 1   No current facility-administered medications for this visit.    Socially she is widowed for 4 years. She has 3 children and one grandchild. She has a remote tobacco history but quit in 1999. There is no alcohol use. She was born in [place]. She moved to [place] in [place] after [place]: Negative; No fevers, chills, or night sweats; no weight change HEENT: Negative; No changes in vision or hearing, sinus congestion, difficulty swallowing Pulmonary: Recent hospitalization with pneumonia No cough, wheezing, shortness of breath, hemoptysis Cardiovascular: Negative; No chest pain, presyncope, syncope, palpatations GI: Recent irritable bowel syndrome flare up; Currently no nausea, vomiting, diarrhea, or abdominal pain GU: Negative; No dysuria, hematuria, or difficulty voiding Musculoskeletal: Intermittent knee discomfort; and right leg discomfort.  She has osteoarthritis. Hematologic/Oncology: Negative; no easy bruising, bleeding Endocrine: Negative; no heat/cold intolerance; she has a history of glucose intolerance Neuro: Negative; no changes in balance, headaches Skin: Negative; No rashes or skin lesions Psychiatric: Negative; No behavioral problems, depression Sleep: Negative; No snoring, daytime sleepiness, hypersomnolence, bruxism, restless legs,  hypnogognic hallucinations, no cataplexy Other comprehensive 14 point system review is negative.  PE BP 102/56 mmHg  Pulse 60  Ht 5\' 2"  (1.575 m)  Wt 142 lb 12.8 oz (64.774 kg)  BMI 26.11 kg/m2  General: Alert, oriented, no distress.  Skin: normal turgor, blister or lesion on the left forearm with mild induration below this. HEENT: Normocephalic, atraumatic. Pupils round and reactive; sclera  anicteric;no lid lag.  Nose without nasal septal hypertrophy Mouth/Parynx benign; Mallinpatti scale 3 Neck: No JVD, no carotid bruits; normal carotid upstroke Chest wall: No tenderness to palpation Lungs: clear to ausculatation and percussion; no wheezing or rales Heart: Irregularly irregular rhythm with a ventricular rate  , now significant improve with rates varying.  Pharynx from 45-88 bpm;  s1 s2 normal 1/6 systolic murmur; no diastolic murmur Abdomen: soft, nontender; no hepatosplenomehaly, BS+; abdominal aorta nontender and not dilated by palpation. Back: No CVA tenderness Pulses 2+ Extremities: no clubbing cyanosis or edema, Homan's sign negative  Neurologic: grossly nonfocal Psychological: Normal affect and mood. Normal cognition.  ECG (independently read by me): Atrial fibrillation with a controlled ventricular response averaging 60 bpm, but with rates varying from 45-88 bpm.  Nondiagnostic T changes.  06/08/2014 ECG (independently read by me): atrial fibrillation with a ventricular rate at 10 8 bpm.  RV conduction delay.  Poor anterior R-wave progression.  September 2015 ECG (independently read by me): Atrial fibrillation with a ventricular rate at 93.  Isolated PVC.  12/04/2013 ECG (independently read by me): Atrial fibrillation at 87 beats per minute.  Nonspecific ST-T changes.   Prior January 2015 ECG (independently read by me): Atrial fibrillation with ventricular response of 87. QTc interval 495 ms. when compared to her prior ECG atrial fibrillation rate has increased to 87 from  73 average rate.  LABS:  BMET    Component Value Date/Time   NA 139 06/22/2014 1005   K 4.2 06/22/2014 1005   CL 102 06/22/2014 1005   CO2 26 06/22/2014 1005   GLUCOSE 139* 06/22/2014 1005   BUN 10 06/22/2014 1005   CREATININE 0.96 06/22/2014 1005   CREATININE 0.78 10/22/2013 1000   CALCIUM 9.1 06/22/2014 1005   GFRNONAA 73* 10/22/2013 1000   GFRNONAA 38* 06/15/2013 1031   GFRAA 85* 10/22/2013 1000   GFRAA 44* 06/15/2013 1031     Hepatic Function Panel     Component Value Date/Time   PROT 7.0 06/22/2014 1005   ALBUMIN 3.8 06/22/2014 1005   AST 12 06/22/2014 1005   ALT 8 06/22/2014 1005   ALKPHOS 84 06/22/2014 1005   BILITOT 0.7 06/22/2014 1005   BILIDIR 0.2 02/20/2010 1139     CBC    Component Value Date/Time   WBC 10.4 06/22/2014 1005   RBC 4.60 06/22/2014 1005   HGB 14.1 06/22/2014 1005   HCT 40.8 06/22/2014 1005   PLT 287 06/22/2014 1005   MCV 88.7 06/22/2014 1005   MCH 30.7 06/22/2014 1005   MCHC 34.6 06/22/2014 1005   RDW 13.4 06/22/2014 1005   LYMPHSABS 1.2 01/27/2014 0735   MONOABS 0.8 01/27/2014 0735   EOSABS 0.1 01/27/2014 0735   BASOSABS 0.0 01/27/2014 0735     BNP    Component Value Date/Time   PROBNP 1789.0* 10/20/2013 1240    Lipid Panel     Component Value Date/Time   CHOL 101 06/22/2014 1005     RADIOLOGY: No results found.    ASSESSMENT AND PLAN: Ms. Carranza  is 20 years following her inferior wall myocardial infarction treated with PTCA of her RCA and 16 years status post successful CABG revascularization surgery. Her last nuclear perfusion study is unchanged from 2 years previously.  She  has  permanent atrial fibrillation and is on warfarin anticoagulation.  Since I last saw her, digoxin was added for medical regimen which previously had consisted of high-dose diltiazem at 360 mg daily, and Toprol-XL 100 mg twice  a day for rate control.  She feels that the digoxin has significantly improved her fast heartbeat.  Recent digoxin  level was obtained which was elevated at 2.4.  Her blood pressure today is 102/56.  Presently, she does not have any signs of edema, and has been taking her Lasix 20 mg daily.  I have recommended that she change this to as needed.  I will reduce her Cardizem dose from 360 back to 300 mg and will also reduce her digoxin dose 0.125 mg to alternating 0.0625/0.125 mg every other day.  If her heart rate consistently gets below 50.  She will further reduce the Lanoxin 2.0625 mg and I will further reduce her diltiazem back to 240 mg.  She is continuing to take atorvastatin 80 mg for hyperlipidemia and is tolerating this without myalgias.  She's not having any anginal symptoms on her isosorbide 30 milligrams.  She has been under increased stress at home with her 18 year old daughter moving in with 4 dogs and 5 cats that she has brought with her.  Her daughter is filing for bankruptcy after her recent significant financial loss. She will return in 2 months for follow-up evaluation.  Time spent: 25 minutes  Troy Sine, MD, [facility]  07/07/2014 12:10 PM

## 2014-07-07 NOTE — Patient Instructions (Signed)
Your physician has recommended you make the following change in your medication: the furosemide has been changed to take only  As needed. The cardizem has been decreased to 300 mg daily. A new prescription has been sent to your pharmacy. The lanoxin has been changed to 1 tablet daily alternating with 1/2 tablet daily.   Your physician recommends that you schedule a follow-up appointment in: 2 months with Dr. Claiborne Billings.

## 2014-07-09 ENCOUNTER — Telehealth: Payer: Self-pay | Admitting: *Deleted

## 2014-07-09 NOTE — Telephone Encounter (Signed)
-----   Message from Troy Sine, MD sent at 07/05/2014  7:36 PM EST ----- Labs good x inc glu and inc dig level;  Decrease digoxin dose by 1/2

## 2014-07-09 NOTE — Telephone Encounter (Signed)
Call and spoke with pt concerning blood work, pt verbally understand.

## 2014-07-28 ENCOUNTER — Ambulatory Visit (INDEPENDENT_AMBULATORY_CARE_PROVIDER_SITE_OTHER): Payer: Medicare Other | Admitting: Pharmacist Clinician (PhC)/ Clinical Pharmacy Specialist

## 2014-07-28 ENCOUNTER — Other Ambulatory Visit: Payer: Self-pay | Admitting: Pharmacist Clinician (PhC)/ Clinical Pharmacy Specialist

## 2014-07-28 DIAGNOSIS — I4891 Unspecified atrial fibrillation: Secondary | ICD-10-CM

## 2014-07-28 DIAGNOSIS — Z7901 Long term (current) use of anticoagulants: Secondary | ICD-10-CM

## 2014-07-28 LAB — POCT INR: INR: 2.6

## 2014-07-28 MED ORDER — WARFARIN SODIUM 5 MG PO TABS: ORAL_TABLET | ORAL | Status: AC

## 2014-08-19 ENCOUNTER — Encounter (HOSPITAL_COMMUNITY): Payer: Self-pay | Admitting: Internal Medicine

## 2014-08-25 ENCOUNTER — Ambulatory Visit (INDEPENDENT_AMBULATORY_CARE_PROVIDER_SITE_OTHER): Payer: Medicare Other | Admitting: Pharmacist Clinician (PhC)/ Clinical Pharmacy Specialist

## 2014-08-25 DIAGNOSIS — I4891 Unspecified atrial fibrillation: Secondary | ICD-10-CM

## 2014-08-25 DIAGNOSIS — Z7901 Long term (current) use of anticoagulants: Secondary | ICD-10-CM

## 2014-08-25 LAB — POCT INR: INR: 2.5

## 2014-08-26 ENCOUNTER — Other Ambulatory Visit: Payer: Self-pay | Admitting: Pharmacist Clinician (PhC)/ Clinical Pharmacy Specialist

## 2014-08-26 MED ORDER — CILOSTAZOL 100 MG PO TABS: 100.0000 mg | ORAL_TABLET | Freq: Two times a day (BID) | ORAL | Status: AC

## 2014-08-26 MED ORDER — DIGOXIN 125 MCG PO TABS: ORAL_TABLET | ORAL | Status: DC

## 2014-09-01 ENCOUNTER — Telehealth: Payer: Self-pay | Admitting: Cardiovascular Disease

## 2014-09-01 MED ORDER — DIGOXIN 125 MCG PO TABS: ORAL_TABLET | ORAL | Status: DC

## 2014-09-01 NOTE — Telephone Encounter (Signed)
Rx(s) sent to pharmacy electronically. Patient notified. 

## 2014-09-01 NOTE — Telephone Encounter (Signed)
Pt called in stating that her Digoxin prescription was sent to the [facility] in which she had to pay $25 out of pocket. She says that she mentioned to Erasmo Downer that she wanted this medication sent to her mail order from now on. Can she have this prescription called to the mail order? Please call  Thanks

## 2014-09-24 ENCOUNTER — Ambulatory Visit (INDEPENDENT_AMBULATORY_CARE_PROVIDER_SITE_OTHER): Payer: Medicare Other | Admitting: Cardiovascular Disease

## 2014-09-24 ENCOUNTER — Encounter: Payer: Self-pay | Admitting: Cardiovascular Disease

## 2014-09-24 ENCOUNTER — Ambulatory Visit (INDEPENDENT_AMBULATORY_CARE_PROVIDER_SITE_OTHER): Payer: Medicare Other | Admitting: Pharmacist Clinician (PhC)/ Clinical Pharmacy Specialist

## 2014-09-24 VITALS — BP 110/74 | HR 89 | Ht 62.0 in | Wt 132.5 lb

## 2014-09-24 DIAGNOSIS — I4891 Unspecified atrial fibrillation: Secondary | ICD-10-CM

## 2014-09-24 DIAGNOSIS — Z7901 Long term (current) use of anticoagulants: Secondary | ICD-10-CM

## 2014-09-24 DIAGNOSIS — I482 Chronic atrial fibrillation, unspecified: Secondary | ICD-10-CM

## 2014-09-24 DIAGNOSIS — E785 Hyperlipidemia, unspecified: Secondary | ICD-10-CM

## 2014-09-24 LAB — POCT INR: INR: 2.8

## 2014-09-24 MED ORDER — ATORVASTATIN CALCIUM 40 MG PO TABS: 40.0000 mg | ORAL_TABLET | Freq: Every evening | ORAL | Status: AC

## 2014-09-24 NOTE — Patient Instructions (Addendum)
Your physician wants you to follow-up in 6 months with Dr. Claiborne Billings. You will receive a reminder letter in the mail 2 months in advance. If you do not receive a letter, please call our office to schedule the follow-up appointment.  INCREASE Digoxin to 0.125 mg (1 whole pill)  DECREASE Lipitor to 40 mg.

## 2014-09-25 ENCOUNTER — Encounter: Payer: Self-pay | Admitting: Cardiovascular Disease

## 2014-09-25 NOTE — Progress Notes (Signed)
Patient ID: De Nurse, female   DOB: 1925/01/25, 81 y.o.   MRN: [id number]     HPI: Cheyenne [name] is a 81 y.o. female who presents to the office today for a follow-up cardiology evaluation.  [name] has known CAD and suffered an inferior wall myocardial infarction in 1997 treated with PTCA of her RCA and ultimately underwent CABG surgery. She has a remote history of atrial flutter and is status post atrial flutter ablation. She also has a history of hyperlipidemia, hypertension, as well as GERD  On 01/29/2013 a 2 year follow-up nuclear study continued to show normal perfusion. Post-stress ejection fraction was 73%. There was no evidence for scar or ischemia.  She had a history of paroxysmal atrial fibrillation . She was readmitted to the hospital in October 2014 with  atrial fibrillation with rapid ventricular response. She has been on chronic anticoagulation. She had taken additional doses of Lopressor prior to her evaluation. Her amiodarone dose was adjusted and ultimately she converted to sinus rhythm.  She has developed recurrent atrial fibrillation is and now is in permanent atrial fibrillation.  She was hospitalized in March 2015 with pneumonia.  She has required high-dose beta blockade and calcium channel blocker therapy for rate suppression of her atrial fibrillation.  She has had increased stress.  Unfortunately, her daughter lost a significant amount of her money in an Internet scam.  Her heart rate when she gets stressed has increase over 100 despite being on Cardizem 240 mg and Toprol-XL 200 mg daily, and a 100 mg twice a day regimen.  Over the last several office visits, her Cardizem was titrated to 300 mg and then subsequently 360 mg without significant benefit for additional rate control.  When I last saw her one month ago, she was still tachycardic and I recommended the addition of digoxin to take 0.25 mg for the first day and then 0.125 mg daily.  With this adjustment, she has noticed a  dramatic improvement in her ventricular rate, which had been continuously greater than 100 bpm.  She states now it predominately is in the 60s, but occasionally her heart rate has dropped into the upper 40s.  I last saw her, I recommended she reduce her Lanoxin to 0.125 mg alternating with 0.0625 mg. She denies any recent chest pain.  There is no PND or orthopnea.  She denies any recent leg swelling.  She does have a history of hyperlipidemia and is on combination therapy with atorvastatin 80 mg and Zetia 10 mg.  There is a history of COPD. She presents now for follow-up evaluation.   Past Medical History  Diagnosis Date  . Atrial fibrillation   . Coronary artery disease   . COPD (chronic obstructive pulmonary disease)   . Hypertension 11/04/2009    renal doppler showed abd aorta normal size  low velocities  w/ moderate amt plaque; rgt & lft kidney  normal size,renal arteries normal patency  . Anxiety   . Pneumonia     5-6 years ago  . Arthritis   . Myocardial infarction     Mild MI 1987,inferior wall  . Paroxysmal atrial fibrillation     last hospitalized July 2013  . Hyperlipidemia   . PVD (peripheral vascular disease) 09/18/2011    carotid doppler showed mild amount of fibrous plaque both carotid arteries w/o significant flow reduction or stenosis  . PVD (peripheral vascular disease) 09/18/2011    Lower extremity doppler showed ABIof 0.97 on the right,0.83 on the left.,50-60%narrowing rgt  common iliac greater than 70% right SFA, right posterior tibial & peroneal arteries occlude  . PVD (peripheral vascular disease) 09/14/2010    ABI's in 2012 were 0.94 and 0.81 but velocities appeared more sigificantly changed  . PVD (peripheral vascular disease) with claudication 09/15/2007    abd aotra moder. amt plaque w/low velocities, left SFA occluded vessel, rgt prox. & mid SFA elvated  velocities ABI rgt O.87,ABI  lft 0.77   . Peripheral angiopathy 03/21/2004    total left SFA w/three vessel  runoff though tibial vessels are diffusely disease tx medically,left 70% distal lft external iliac,70% rgt common iliac, 50-60% prox and mid rgt SFA ,  . Diabetes mellitus without complication     Past Surgical History  Procedure Laterality Date  . Nasal sinus surgery    . Quadruple  bypass    . Cholecystectomy    . Eye surgery      bilateral catracts  . Coronary angioplasty  1987    emergent,60% LAD  . Coronary artery bypass graft  03/23/1998    CABGx4 left internal mammary artery to LAD, SVG to diagonal branch to LAD, first obtuse branch Left Circ,and posterior descending branch of RCA  . Doppler echocardiography  05/29/2012    proximal septal thickening w/normal systolic and probable grade 2 diastolic dysfunction,mod LA dilatation, mild  annilar calcifcation w/mild to mod MR, mild to mod.TR , mild pulm. HTN w/upper normal pulm. pressures. mild aortic sclerosis w/o stenosis and pulm.valvular regrurgitation  . Doppler echocardiography  04/16/2011    EF =>55%, LV normal,Grade II diastolic dysfunction, aortic valve moderately sclerotic  . Doppler echocardiography  06/16/2009    EF => 55%,LV normal ,mild to moder. mitral annular calcifcation,no aortic stenosis, aortic valve appears moder.sclerotic  . Cardiac stress test  11/09/2010    EF 75%, small area of reversible lateral ischemia. SDS 2 . Extent 3%  . Cardiac stress test  11/04/2009    EF 75%, LV normal ,   . Cardiac catheterization  03/22/1998    severe four vessel coronary disease normal LV function  . Atrial flutter ablation  11/11/1996    Dr Caryl Comes did RFablation atrial flutter  and sinus node re-entry tachycardia  . Cardioversion N/A 06/19/2013    Procedure: CARDIOVERSION;  Surgeon: Troy Sine, MD;  Location: [facility] ENDOSCOPY;  Service: Cardiovascular;  Laterality: N/A;    Allergies  Allergen Reactions  . Cephalexin Diarrhea  . Elavil [Amitriptyline] Other (See Comments)    Felt drunk  . Nifedipine Other (See Comments)     Heart races  . Tegretol [Carbamazepine] Other (See Comments)    Felt drunk    Current Outpatient Prescriptions  Medication Sig Dispense Refill  . acetaminophen (TYLENOL) 500 MG tablet Take 1,000 mg by mouth every 6 (six) hours as needed. For pain    . ADVAIR DISKUS 250-50 MCG/DOSE AEPB     . ALPRAZolam (XANAX) 0.25 MG tablet Take 0.5 mg by mouth at bedtime. For anxiety    . atorvastatin (LIPITOR) 40 MG tablet Take 1 tablet (40 mg total) by mouth every evening. 90 tablet 1  . calcium carbonate (OS-CAL) 600 MG TABS Take 600 mg by mouth 2 (two) times daily with a meal.    . cilostazol (PLETAL) 100 MG tablet Take 1 tablet (100 mg total) by mouth 2 (two) times daily. 180 tablet 1  . dicyclomine (BENTYL) 10 MG capsule Take 10 mg by mouth 3 (three) times daily before meals.     . digoxin (LANOXIN)  0.125 MG tablet Take 1 tablet by mouth every other day, alternating with 1/2 tablet 70 tablet 3  . diltiazem (CARDIZEM CD) 300 MG 24 hr capsule Take 1 capsule (300 mg total) by mouth daily. 90 capsule 3  . esomeprazole (NEXIUM) 40 MG capsule Take 40 mg by mouth daily before breakfast.    . ezetimibe (ZETIA) 10 MG tablet Take 1 tablet (10 mg total) by mouth every evening. 90 tablet 3  . furosemide (LASIX) 20 MG tablet Take 20 mg by mouth as needed.     Marland Kitchen HYDROcodone-acetaminophen (NORCO/VICODIN) 5-325 MG per tablet Take 1 tablet by mouth every 8 (eight) hours as needed.    . isosorbide mononitrate (IMDUR) 30 MG 24 hr tablet Take 1 tablet (30 mg total) by mouth daily before breakfast. 90 tablet 3  . Levocetirizine Dihydrochloride (XYZAL PO) Take 10 mg by mouth every evening.    . metoprolol succinate (TOPROL-XL) 100 MG 24 hr tablet Take 1 tablet (100 mg total) by mouth 2 (two) times daily. 60 tablet 1  . nitroGLYCERIN (NITROSTAT) 0.4 MG SL tablet Place 1 tablet (0.4 mg total) under the tongue every 5 (five) minutes as needed for chest pain. 25 tablet 9  . Omega-3 Fatty Acids (FISH OIL) 1200 MG CAPS Take 1  capsule by mouth 2 (two) times daily.    . potassium chloride SA (K-DUR,KLOR-CON) 20 MEQ tablet Take 20 mEq by mouth 2 (two) times daily.     . psyllium (METAMUCIL) 58.6 % powder Take 1 packet by mouth daily. At lunch or supper    . ramipril (ALTACE) 10 MG capsule Take 1 capsule (10 mg total) by mouth daily. 90 capsule 3  . warfarin (COUMADIN) 5 MG tablet Take 1 tablet by mouth daily or as directed 90 tablet 1   No current facility-administered medications for this visit.    Socially she is widowed for 4 years. She has 3 children and one grandchild. She has a remote tobacco history but quit in 1999. There is no alcohol use. She was born in [place]. She moved to [place] in [place] after [place]: Negative; No fevers, chills, or night sweats; no weight change HEENT: Negative; No changes in vision or hearing, sinus congestion, difficulty swallowing Pulmonary: Recent hospitalization with pneumonia No cough, wheezing, shortness of breath, hemoptysis Cardiovascular: Negative; No chest pain, presyncope, syncope, palpatations GI: Recent irritable bowel syndrome flare up; Currently no nausea, vomiting, diarrhea, or abdominal pain GU: Negative; No dysuria, hematuria, or difficulty voiding Musculoskeletal: Intermittent knee discomfort; and right leg discomfort.  She has osteoarthritis. Hematologic/Oncology: Negative; no easy bruising, bleeding Endocrine: Negative; no heat/cold intolerance; she has a history of glucose intolerance Neuro: Negative; no changes in balance, headaches Skin: Negative; No rashes or skin lesions Psychiatric: Negative; No behavioral problems, depression Sleep: Negative; No snoring, daytime sleepiness, hypersomnolence, bruxism, restless legs, hypnogognic hallucinations, no cataplexy Other comprehensive 14 point system review is negative.  PE BP 110/74 mmHg  Pulse 89  Ht _0  (1.575 m)  Wt 132 lb 8 oz (60.102 kg)  BMI 24.23 kg/m2  General: Alert, oriented,  no distress.  Skin: normal turgor, blister or lesion on the left forearm with mild induration below this. HEENT: Normocephalic, atraumatic. Pupils round and reactive; sclera anicteric;no lid lag.  Nose without nasal septal hypertrophy Mouth/Parynx benign; Mallinpatti scale 3 Neck: No JVD, no carotid bruits; normal carotid upstroke Chest wall: No tenderness to palpation Lungs: clear to ausculatation and percussion; no wheezing or  rales Heart: Irregularly irregular rhythm with a ventricular rate  , now significant improve with rates varying.  Pharynx from 45-88 bpm;  s1 s2 normal 1/6 systolic murmur; no diastolic murmur Abdomen: soft, nontender; no hepatosplenomehaly, BS+; abdominal aorta nontender and not dilated by palpation. Back: No CVA tenderness Pulses 2+ Extremities: no clubbing cyanosis or edema, Homan's sign negative  Neurologic: grossly nonfocal Psychological: Normal affect and mood. Normal cognition.  ECG (independently read by me):  Atrial fibrillation at a proximally 89 bpm. Nonspecific ST-T changes.  07/07/2014 ECG (independently read by me): Atrial fibrillation with a controlled ventricular response averaging 60 bpm, but with rates varying from 45-88 bpm.  Nondiagnostic T changes.  06/08/2014 ECG (independently read by me): atrial fibrillation with a ventricular rate at 10 8 bpm.  RV conduction delay.  Poor anterior R-wave progression.  September 2015 ECG (independently read by me): Atrial fibrillation with a ventricular rate at 93.  Isolated PVC.  12/04/2013 ECG (independently read by me): Atrial fibrillation at 87 beats per minute.  Nonspecific ST-T changes.   Prior January 2015 ECG (independently read by me): Atrial fibrillation with ventricular response of 87. QTc interval 495 ms. when compared to her prior ECG atrial fibrillation rate has increased to 87 from 73 average rate.  LABS:  BMET  BMP Latest Ref Rng 06/22/2014 10/22/2013 10/21/2013  Glucose 70 - 99 mg/dL  139(H) 168(H) 113(H)  BUN 6 - 23 mg/dL _0 Creatinine 0.50 - 1.10 mg/dL 0.96 0.78 0.83  Sodium 135 - 145 mEq/L 139 132(L) 137  Potassium 3.5 - 5.3 mEq/L 4.2 4.2 3.9  Chloride 96 - 112 mEq/L 102 92(L) 97  CO2 19 - 32 mEq/L _1 Calcium 8.4 - 10.5 mg/dL 9.1 9.1 8.8     Hepatic Function Panel   Hepatic Function Latest Ref Rng 06/22/2014 03/05/2013 04/24/2010  Total Protein 6.0 - 8.3 g/dL 7.0 6.8 7.3  Albumin 3.5 - 5.2 g/dL 3.8 3.8 3.8  AST 0 - 37 U/L _2 ALT 0 - 35 U/L 8 37(H) 30  Alk Phosphatase 39 - 117 U/L 84 72 123(H)  Total Bilirubin 0.2 - 1.2 mg/dL 0.7 0.7 0.7  Bilirubin, Direct 0.0-0.3 mg/dL - - -     CBC  CBC Latest Ref Rng 06/22/2014 01/27/2014 10/22/2013  WBC 4.0 - 10.5 K/uL 10.4 9.2 15.9(H)  Hemoglobin 12.0 - 15.0 g/dL 14.1 14.7 15.1(H)  Hematocrit 36.0 - 46.0 % 40.8 44.5 44.3  Platelets 150 - 400 K/uL 287 230 278     BNP    Component Value Date/Time   PROBNP 1789.0* 10/20/2013 1240    Lipid Panel   Lipid Panel     Component Value Date/Time   CHOL 101 06/22/2014 1005   TRIG 133 06/22/2014 1005   TRIG 103 03/05/2013 1236   HDL 42 06/22/2014 1005   CHOLHDL 2.4 06/22/2014 1005   VLDL 27 06/22/2014 1005   LDLCALC 32 06/22/2014 1005   LDLCALC 43 03/05/2013 1236      RADIOLOGY: No results found.    ASSESSMENT AND PLAN: Ms. Leitzke  is 20 years following her inferior wall myocardial infarction treated with PTCA of her RCA and 16 years status post successful CABG revascularization surgery. Her last nuclear perfusion study is unchanged from 2 years previously.  She  has  permanent atrial fibrillation and is on warfarin anticoagulation.  She has been difficult to control and has required high-dose medical regimen for 12.  Her ventricular rate.  We have been able to stabilize her heart rhythm most recently with her current regimen consisting o Cardizem CD at 300 mg daily , Toprol-XL 100 mg twice a day and the addition of digoxin. When I last saw  her, I reduced her dose to 0.125 mg alternating with 0.0625 mg. On examination today, she was having periods where she would have salvos of increased heart rate up to 100 which was not picked up on the ECG.  Her ECG today shows a ventricular rate at 89, which has increased from her last ECG when she was on the higher dose of Lanoxin. She continues to be on Coumadin anticoagulation with her most recent INR 2.8 which is in the therapeutic range. I have suggested that she slightly alter her digoxin and take 0.125 mg but several days per week take the 0.0625 mg dose depending upon her heart rate  And take this if her heart rate is below 60. I reviewed her recent laboratory.  I have recommended she reduce her atorvastatin from 80 mg to 40 mg her most recent LDL cholesterol at 32.  Her blood pressure today is stable on her current therapy. She does not have significant edema and only takes her furosemide on an as-needed basis.  She's not having any anginal symptoms. There is no wheezing. As long as she remains stable, I will see her in 6 months for reevaluation or sooner if problems arise.   Time spent: 25 minutes  Troy Sine, MD, [facility]  09/25/2014 2:39 PM

## 2014-09-27 ENCOUNTER — Other Ambulatory Visit: Payer: Self-pay | Admitting: Pharmacist Clinician (PhC)/ Clinical Pharmacy Specialist

## 2014-09-27 MED ORDER — EZETIMIBE 10 MG PO TABS: 10.0000 mg | ORAL_TABLET | Freq: Every evening | ORAL | Status: AC

## 2014-09-27 MED ORDER — RAMIPRIL 10 MG PO CAPS: 10.0000 mg | ORAL_CAPSULE | Freq: Every day | ORAL | Status: AC

## 2014-09-27 MED ORDER — ESOMEPRAZOLE MAGNESIUM 40 MG PO CPDR: 40.0000 mg | DELAYED_RELEASE_CAPSULE | Freq: Every day | ORAL | Status: AC

## 2014-10-20 ENCOUNTER — Other Ambulatory Visit: Payer: Self-pay | Admitting: Cardiovascular Disease

## 2014-10-20 MED ORDER — DIGOXIN 125 MCG PO TABS: 0.1250 mg | ORAL_TABLET | Freq: Every day | ORAL | Status: AC

## 2014-10-20 NOTE — Telephone Encounter (Signed)
Pt need a new prescription for her Digoxin,her dosage have changed. She takes a tablet every day now. Please send to [facility] .

## 2014-10-20 NOTE — Telephone Encounter (Signed)
Rx(s) sent to pharmacy electronically.  

## 2014-10-22 ENCOUNTER — Other Ambulatory Visit: Payer: Self-pay | Admitting: Pharmacist Clinician (PhC)/ Clinical Pharmacy Specialist

## 2014-10-22 ENCOUNTER — Ambulatory Visit (INDEPENDENT_AMBULATORY_CARE_PROVIDER_SITE_OTHER): Payer: Medicare Other | Admitting: Pharmacist Clinician (PhC)/ Clinical Pharmacy Specialist

## 2014-10-22 ENCOUNTER — Ambulatory Visit (HOSPITAL_COMMUNITY)
Admission: RE | Admit: 2014-10-22 | Discharge: 2014-10-22 | Disposition: A | Discharge status: Home / Self Care | Payer: Medicare Other | Source: Ambulatory Visit | Attending: Cardiovascular Disease | Admitting: Cardiovascular Disease

## 2014-10-22 DIAGNOSIS — Z7901 Long term (current) use of anticoagulants: Secondary | ICD-10-CM

## 2014-10-22 DIAGNOSIS — I4891 Unspecified atrial fibrillation: Secondary | ICD-10-CM | POA: Diagnosis not present

## 2014-10-22 DIAGNOSIS — I70203 Unspecified atherosclerosis of native arteries of extremities, bilateral legs: Secondary | ICD-10-CM | POA: Diagnosis not present

## 2014-10-22 DIAGNOSIS — I739 Peripheral vascular disease, unspecified: Secondary | ICD-10-CM | POA: Diagnosis present

## 2014-10-22 LAB — POCT INR: INR: 2.4

## 2014-10-22 NOTE — Progress Notes (Signed)
Lower extremity arterial duplex completed. °[name],RVT °

## 2014-10-27 ENCOUNTER — Telehealth: Payer: Self-pay | Admitting: *Deleted

## 2014-10-27 NOTE — Telephone Encounter (Signed)
Faxed signed prescription okay for manufacturer change on digoxin to [facility].

## 2014-10-29 ENCOUNTER — Encounter: Payer: Self-pay | Admitting: *Deleted

## 2014-11-05 IMAGING — CR DG CHEST 1V PORT
1 series · 1 of 1 positions shown · non-contrast
Comparison: 02/24/2012

CLINICAL DATA: Atrial fibrillation. Palpitations.

EXAM:
PORTABLE CHEST - 1 VIEW

[AP]
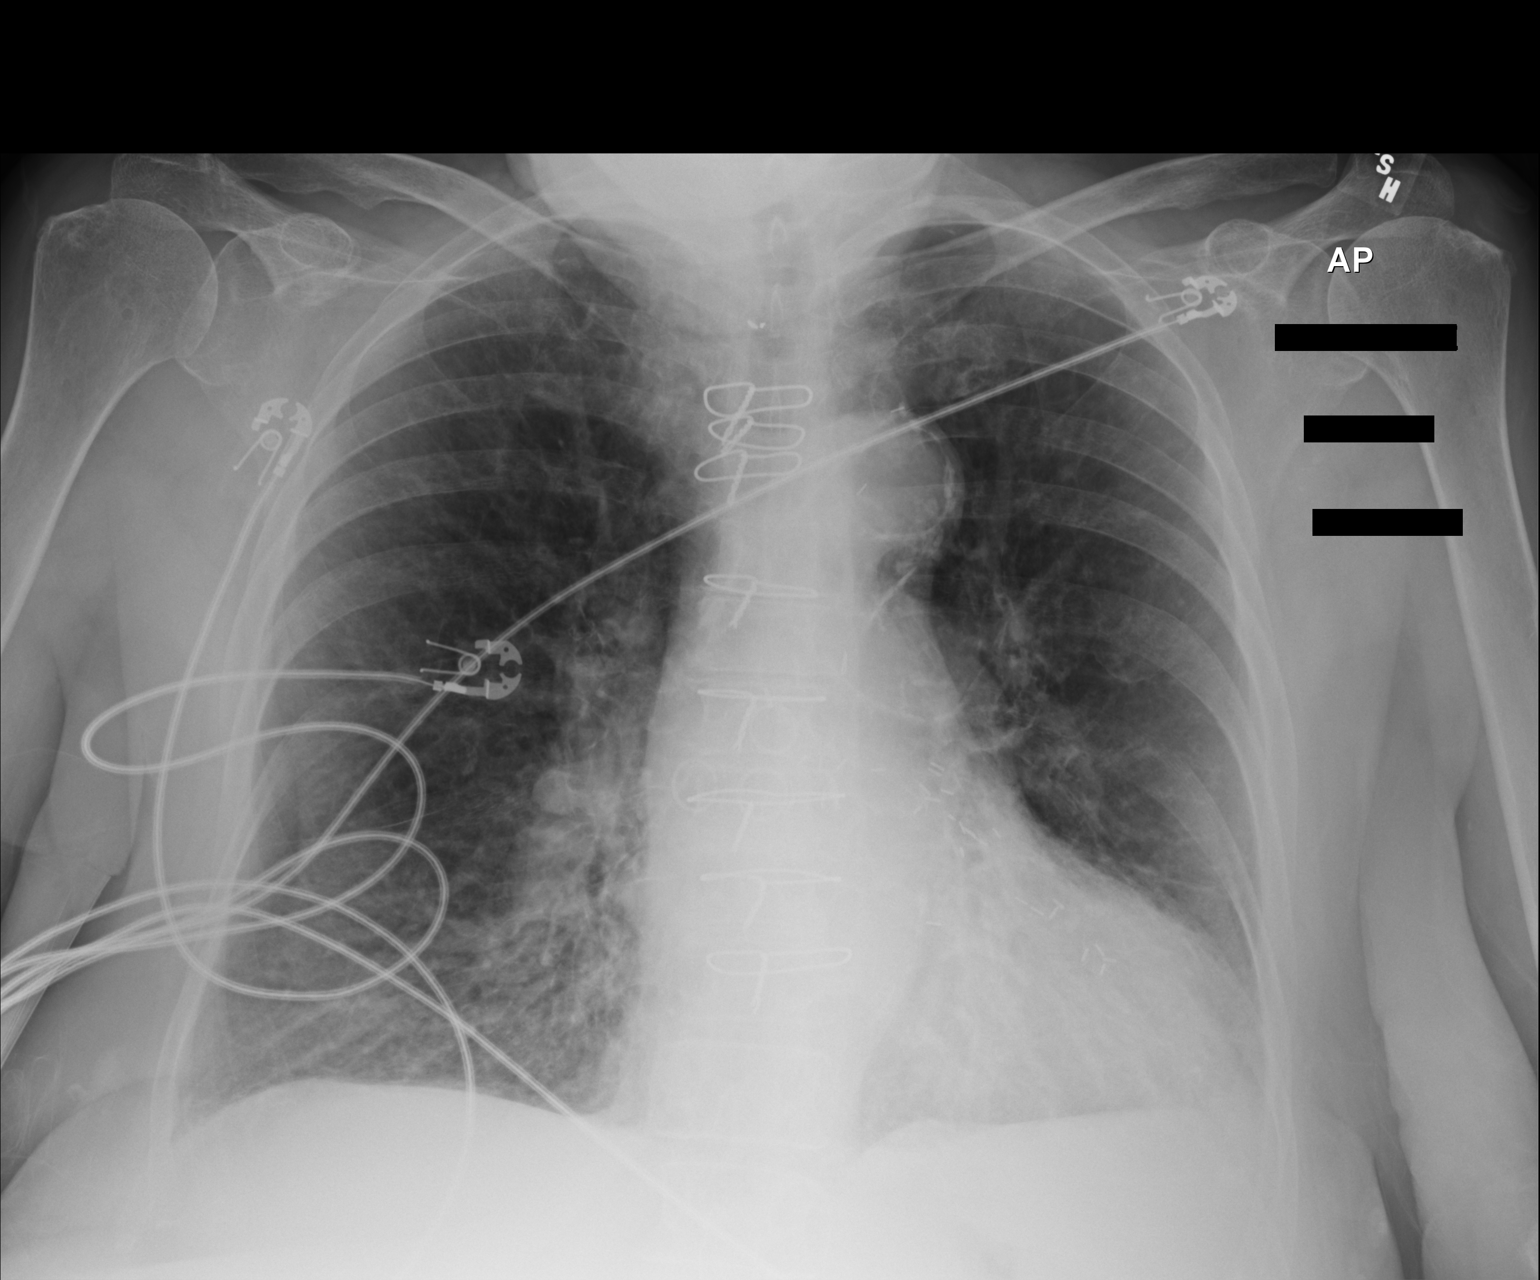

[1 of 1 positions shown; findings below may reference images not displayed]

FINDINGS: Heart size and pulmonary vascularity are normal. There is diffuse
accentuation of the interstitial markings, slightly more accentuated
on the prior exam. Diaphragm is flattened consistent with emphysema.
IMPRESSION: 1. Slight interstitial accentuation which could represent slight
pulmonary edema.
2. Emphysema.

## 2014-11-17 ENCOUNTER — Ambulatory Visit (INDEPENDENT_AMBULATORY_CARE_PROVIDER_SITE_OTHER): Payer: Medicare Other | Admitting: Pharmacist Clinician (PhC)/ Clinical Pharmacy Specialist

## 2014-11-17 DIAGNOSIS — Z7901 Long term (current) use of anticoagulants: Secondary | ICD-10-CM

## 2014-11-17 DIAGNOSIS — I4891 Unspecified atrial fibrillation: Secondary | ICD-10-CM

## 2014-11-17 LAB — POCT INR: INR: 2.2

## 2014-12-06 ENCOUNTER — Emergency Department (HOSPITAL_BASED_OUTPATIENT_CLINIC_OR_DEPARTMENT_OTHER): Payer: Medicare Other

## 2014-12-06 ENCOUNTER — Encounter (HOSPITAL_BASED_OUTPATIENT_CLINIC_OR_DEPARTMENT_OTHER): Payer: Self-pay | Admitting: *Deleted

## 2014-12-06 ENCOUNTER — Emergency Department (HOSPITAL_BASED_OUTPATIENT_CLINIC_OR_DEPARTMENT_OTHER)
Admission: EM | Admit: 2014-12-06 | Discharge: 2014-12-06 | Disposition: A | Discharge status: Home / Self Care | Payer: Medicare Other | Attending: Emergency Medicine | Admitting: Emergency Medicine

## 2014-12-06 DIAGNOSIS — Z7901 Long term (current) use of anticoagulants: Secondary | ICD-10-CM | POA: Insufficient documentation

## 2014-12-06 DIAGNOSIS — R1084 Generalized abdominal pain: Secondary | ICD-10-CM | POA: Insufficient documentation

## 2014-12-06 DIAGNOSIS — E119 Type 2 diabetes mellitus without complications: Secondary | ICD-10-CM | POA: Insufficient documentation

## 2014-12-06 DIAGNOSIS — R1032 Left lower quadrant pain: Secondary | ICD-10-CM | POA: Insufficient documentation

## 2014-12-06 DIAGNOSIS — I2510 Atherosclerotic heart disease of native coronary artery without angina pectoris: Secondary | ICD-10-CM | POA: Insufficient documentation

## 2014-12-06 DIAGNOSIS — I4891 Unspecified atrial fibrillation: Secondary | ICD-10-CM | POA: Insufficient documentation

## 2014-12-06 DIAGNOSIS — Z87891 Personal history of nicotine dependence: Secondary | ICD-10-CM | POA: Insufficient documentation

## 2014-12-06 DIAGNOSIS — E785 Hyperlipidemia, unspecified: Secondary | ICD-10-CM | POA: Insufficient documentation

## 2014-12-06 DIAGNOSIS — I10 Essential (primary) hypertension: Secondary | ICD-10-CM | POA: Diagnosis not present

## 2014-12-06 DIAGNOSIS — J449 Chronic obstructive pulmonary disease, unspecified: Secondary | ICD-10-CM | POA: Insufficient documentation

## 2014-12-06 DIAGNOSIS — I499 Cardiac arrhythmia, unspecified: Secondary | ICD-10-CM | POA: Insufficient documentation

## 2014-12-06 DIAGNOSIS — R110 Nausea: Secondary | ICD-10-CM | POA: Diagnosis not present

## 2014-12-06 DIAGNOSIS — F419 Anxiety disorder, unspecified: Secondary | ICD-10-CM | POA: Diagnosis not present

## 2014-12-06 DIAGNOSIS — M1990 Unspecified osteoarthritis, unspecified site: Secondary | ICD-10-CM | POA: Diagnosis not present

## 2014-12-06 DIAGNOSIS — Z9861 Coronary angioplasty status: Secondary | ICD-10-CM | POA: Insufficient documentation

## 2014-12-06 DIAGNOSIS — Z951 Presence of aortocoronary bypass graft: Secondary | ICD-10-CM | POA: Insufficient documentation

## 2014-12-06 DIAGNOSIS — R109 Unspecified abdominal pain: Secondary | ICD-10-CM | POA: Diagnosis present

## 2014-12-06 DIAGNOSIS — Z9049 Acquired absence of other specified parts of digestive tract: Secondary | ICD-10-CM | POA: Diagnosis not present

## 2014-12-06 DIAGNOSIS — Z79899 Other long term (current) drug therapy: Secondary | ICD-10-CM | POA: Insufficient documentation

## 2014-12-06 DIAGNOSIS — R1031 Right lower quadrant pain: Secondary | ICD-10-CM | POA: Insufficient documentation

## 2014-12-06 DIAGNOSIS — I252 Old myocardial infarction: Secondary | ICD-10-CM | POA: Insufficient documentation

## 2014-12-06 DIAGNOSIS — Z8701 Personal history of pneumonia (recurrent): Secondary | ICD-10-CM | POA: Diagnosis not present

## 2014-12-06 LAB — URINALYSIS, ROUTINE W REFLEX MICROSCOPIC
BILIRUBIN URINE: NEGATIVE
Glucose, UA: NEGATIVE mg/dL
Hgb urine dipstick: NEGATIVE
Ketones, ur: NEGATIVE mg/dL
Nitrite: NEGATIVE
Protein, ur: NEGATIVE mg/dL
Specific Gravity, Urine: 1.011 (ref 1.005–1.030)
Urobilinogen, UA: 0.2 mg/dL (ref 0.0–1.0)
pH: 6 (ref 5.0–8.0)

## 2014-12-06 LAB — COMPREHENSIVE METABOLIC PANEL
ALT: 10 U/L — AB (ref 14–54)
ANION GAP: 9 (ref 5–15)
AST: 14 U/L — ABNORMAL LOW (ref 15–41)
Albumin: 2.9 g/dL — ABNORMAL LOW (ref 3.5–5.0)
Alkaline Phosphatase: 81 U/L (ref 38–126)
BUN: 13 mg/dL (ref 6–20)
CALCIUM: 8.2 mg/dL — AB (ref 8.9–10.3)
CHLORIDE: 98 mmol/L — AB (ref 101–111)
CO2: 24 mmol/L (ref 22–32)
Creatinine, Ser: 0.74 mg/dL (ref 0.44–1.00)
GFR calc Af Amer: >60 mL/min (ref >60)
GFR calc non Af Amer: >60 mL/min (ref >60)
Glucose, Bld: 175 mg/dL — ABNORMAL HIGH (ref 70–99)
POTASSIUM: 3.8 mmol/L (ref 3.5–5.1)
SODIUM: 131 mmol/L — AB (ref 135–145)
TOTAL PROTEIN: 7.2 g/dL (ref 6.5–8.1)
Total Bilirubin: 0.8 mg/dL (ref 0.3–1.2)

## 2014-12-06 LAB — CBC WITH DIFFERENTIAL/PLATELET
BASOS PCT: 0 % (ref 0–1)
Basophils Absolute: 0.1 10*3/uL (ref 0.0–0.1)
EOS ABS: 0 10*3/uL (ref 0.0–0.7)
Eosinophils Relative: 0 % (ref 0–5)
HCT: 43.4 % (ref 36.0–46.0)
Hemoglobin: 14.4 g/dL (ref 12.0–15.0)
LYMPHS ABS: 1.1 10*3/uL (ref 0.7–4.0)
LYMPHS PCT: 7 % — AB (ref 12–46)
MCH: 29.1 pg (ref 26.0–34.0)
MCHC: 33.2 g/dL (ref 30.0–36.0)
MCV: 87.9 fL (ref 78.0–100.0)
Monocytes Absolute: 1.4 10*3/uL — ABNORMAL HIGH (ref 0.1–1.0)
Monocytes Relative: 9 % (ref 3–12)
NEUTROS PCT: 84 % — AB (ref 43–77)
Neutro Abs: 12.8 10*3/uL — ABNORMAL HIGH (ref 1.7–7.7)
PLATELETS: 349 10*3/uL (ref 150–400)
RBC: 4.94 MIL/uL (ref 3.87–5.11)
RDW: 14.1 % (ref 11.5–15.5)
WBC: 15.4 10*3/uL — ABNORMAL HIGH (ref 4.0–10.5)

## 2014-12-06 LAB — URINE MICROSCOPIC-ADD ON

## 2014-12-06 LAB — LIPASE, BLOOD: Lipase: 51 U/L (ref 22–51)

## 2014-12-06 LAB — TROPONIN I

## 2014-12-06 MED ORDER — ONDANSETRON HCL 4 MG PO TABS: 4.0000 mg | ORAL_TABLET | Freq: Four times a day (QID) | ORAL | Status: AC

## 2014-12-06 MED ORDER — IOHEXOL 300 MG/ML  SOLN
100.0000 mL | Freq: Once | INTRAMUSCULAR | Status: AC | PRN
  Administered 2014-12-06: 100 mL via INTRAVENOUS

## 2014-12-06 MED ORDER — SODIUM CHLORIDE 0.9 % IV BOLUS (SEPSIS)
500.0000 mL | Freq: Once | INTRAVENOUS | Status: AC
  Administered 2014-12-06: 500 mL via INTRAVENOUS

## 2014-12-06 MED ORDER — ONDANSETRON HCL 4 MG/2ML IJ SOLN
4.0000 mg | Freq: Once | INTRAMUSCULAR | Status: AC
  Administered 2014-12-06: 4 mg via INTRAVENOUS
  Filled 2014-12-06: qty 2

## 2014-12-06 MED ORDER — MORPHINE SULFATE 4 MG/ML IJ SOLN
4.0000 mg | Freq: Once | INTRAMUSCULAR | Status: AC
  Administered 2014-12-06: 4 mg via INTRAVENOUS
  Filled 2014-12-06: qty 1

## 2014-12-06 NOTE — ED Provider Notes (Signed)
CSN: [id number]     Arrival date & time 12/06/14  1119 History   First MD Initiated Contact with Patient 12/06/14 1130     Chief Complaint  Patient presents with  . Abdominal Pain     (Consider location/radiation/quality/duration/timing/severity/associated sxs/prior Treatment) HPI  Cheyenne [name] is a 81 y.o. female presenting with one-day history of abdominal: Pain described as aching and intermittent since yesterday. It is associated with nausea. She has taken Tylenol as well as a laxative last night and had a small BM this a.m. that is nonbloody and not melanotic. She denied any significant improvement. No alleviating or aggravating factors. She denies fevers or chills. No diarrhea or emesis. Patient has had cholecystectomy but no other abdominal surgeries. 3 weeks ago patient was diagnosed with lung cancer metastasized to lymph nodes. She is not undergoing any chemotherapy or radiation. Patient and family at bedside and agreed to palliative care when it comes to the cancer. Otherwise they request full treatment for abdominal abnormalities. No chest pain or worsening shortness of breath. Patient states she uses oxygen at home at times intermittently.   Past Medical History  Diagnosis Date  . Atrial fibrillation   . Coronary artery disease   . COPD (chronic obstructive pulmonary disease)   . Hypertension 11/04/2009    renal doppler showed abd aorta normal size  low velocities  w/ moderate amt plaque; rgt & lft kidney  normal size,renal arteries normal patency  . Anxiety   . Pneumonia     5-6 years ago  . Arthritis   . Myocardial infarction     Mild MI 1987,inferior wall  . Paroxysmal atrial fibrillation     last hospitalized July 2013  . Hyperlipidemia   . PVD (peripheral vascular disease) 09/18/2011    carotid doppler showed mild amount of fibrous plaque both carotid arteries w/o significant flow reduction or stenosis  . PVD (peripheral vascular disease) 09/18/2011    Lower extremity  doppler showed ABIof 0.97 on the right,0.83 on the left.,50-60%narrowing rgt common iliac greater than 70% right SFA, right posterior tibial & peroneal arteries occlude  . PVD (peripheral vascular disease) 09/14/2010    ABI's in 2012 were 0.94 and 0.81 but velocities appeared more sigificantly changed  . PVD (peripheral vascular disease) with claudication 09/15/2007    abd aotra moder. amt plaque w/low velocities, left SFA occluded vessel, rgt prox. & mid SFA elvated  velocities ABI rgt O.87,ABI  lft 0.77   . Peripheral angiopathy 03/21/2004    total left SFA w/three vessel runoff though tibial vessels are diffusely disease tx medically,left 70% distal lft external iliac,70% rgt common iliac, 50-60% prox and mid rgt SFA ,  . Diabetes mellitus without complication    Past Surgical History  Procedure Laterality Date  . Nasal sinus surgery    . Quadruple  bypass    . Cholecystectomy    . Eye surgery      bilateral catracts  . Coronary angioplasty  1987    emergent,60% LAD  . Coronary artery bypass graft  03/23/1998    CABGx4 left internal mammary artery to LAD, SVG to diagonal branch to LAD, first obtuse branch Left Circ,and posterior descending branch of RCA  . Doppler echocardiography  05/29/2012    proximal septal thickening w/normal systolic and probable grade 2 diastolic dysfunction,mod LA dilatation, mild  annilar calcifcation w/mild to mod MR, mild to mod.TR , mild pulm. HTN w/upper normal pulm. pressures. mild aortic sclerosis w/o stenosis and pulm.valvular regrurgitation  .  Doppler echocardiography  04/16/2011    EF =>55%, LV normal,Grade II diastolic dysfunction, aortic valve moderately sclerotic  . Doppler echocardiography  06/16/2009    EF => 55%,LV normal ,mild to moder. mitral annular calcifcation,no aortic stenosis, aortic valve appears moder.sclerotic  . Cardiac stress test  11/09/2010    EF 75%, small area of reversible lateral ischemia. SDS 2 . Extent 3%  . Cardiac stress  test  11/04/2009    EF 75%, LV normal ,   . Cardiac catheterization  03/22/1998    severe four vessel coronary disease normal LV function  . Atrial flutter ablation  11/11/1996    Dr Caryl Comes did RFablation atrial flutter  and sinus node re-entry tachycardia  . Cardioversion N/A 06/19/2013    Procedure: CARDIOVERSION;  Surgeon: Troy Sine, MD;  Location: [facility];  Service: Cardiovascular;  Laterality: N/A;   Family History  Problem Relation Age of Onset  . Heart disease Father   . Breast cancer Paternal Aunt    History  Substance Use Topics  . Smoking status: Former Smoker -- 1.50 packs/day    Types: Cigarettes    Quit date: 03/23/1998  . Smokeless tobacco: Never Used     Comment: quit smoking 14 years ago.  . Alcohol Use: No   OB History    Gravida Para Term Preterm AB TAB SAB Ectopic Multiple Living   3 3        3      Review of Systems 10 Systems reviewed and are negative for acute change except as noted in the HPI.    Allergies  Cephalexin; Elavil; Nifedipine; and Tegretol  Home Medications   Prior to Admission medications   Medication Sig Start Date End Date Taking? Authorizing Provider  acetaminophen (TYLENOL) 500 MG tablet Take 1,000 mg by mouth every 6 (six) hours as needed. For pain    Historical Provider, MD  ADVAIR DISKUS 250-50 MCG/DOSE AEPB  09/03/14   Historical Provider, MD  ALPRAZolam Duanne Moron) 0.25 MG tablet Take 0.5 mg by mouth at bedtime. For anxiety    Historical Provider, MD  atorvastatin (LIPITOR) 40 MG tablet Take 1 tablet (40 mg total) by mouth every evening. 09/24/14   Troy Sine, MD  calcium carbonate (OS-CAL) 600 MG TABS Take 600 mg by mouth 2 (two) times daily with a meal.    Historical Provider, MD  cilostazol (PLETAL) 100 MG tablet Take 1 tablet (100 mg total) by mouth 2 (two) times daily. 08/26/14   Troy Sine, MD  dicyclomine (BENTYL) 10 MG capsule Take 10 mg by mouth 3 (three) times daily before meals.  07/10/12   Historical  Provider, MD  digoxin (LANOXIN) 0.125 MG tablet Take 1 tablet (0.125 mg total) by mouth daily. 10/20/14   Troy Sine, MD  diltiazem (CARDIZEM CD) 300 MG 24 hr capsule Take 1 capsule (300 mg total) by mouth daily. 07/07/14   Troy Sine, MD  esomeprazole (NEXIUM) 40 MG capsule Take 1 capsule (40 mg total) by mouth daily before breakfast. 09/27/14   Troy Sine, MD  ezetimibe (ZETIA) 10 MG tablet Take 1 tablet (10 mg total) by mouth every evening. 09/27/14   Troy Sine, MD  furosemide (LASIX) 20 MG tablet Take 20 mg by mouth as needed.     Historical Provider, MD  HYDROcodone-acetaminophen (NORCO/VICODIN) 5-325 MG per tablet Take 1 tablet by mouth every 8 (eight) hours as needed. 12/15/13   Historical Provider, MD  isosorbide mononitrate (IMDUR) 30  MG 24 hr tablet Take 1 tablet (30 mg total) by mouth daily before breakfast. 11/02/13   Troy Sine, MD  Levocetirizine Dihydrochloride (XYZAL PO) Take 10 mg by mouth every evening.    Historical Provider, MD  metoprolol succinate (TOPROL-XL) 100 MG 24 hr tablet Take 1 tablet (100 mg total) by mouth 2 (two) times daily. 04/13/14   Troy Sine, MD  nitroGLYCERIN (NITROSTAT) 0.4 MG SL tablet Place 1 tablet (0.4 mg total) under the tongue every 5 (five) minutes as needed for chest pain. 01/01/14   Troy Sine, MD  Omega-3 Fatty Acids (FISH OIL) 1200 MG CAPS Take 1 capsule by mouth 2 (two) times daily.    Historical Provider, MD  ondansetron (ZOFRAN) 4 MG tablet Take 1 tablet (4 mg total) by mouth every 6 (six) hours. 12/06/14   Al Corpus, PA-C  potassium chloride SA (K-DUR,KLOR-CON) 20 MEQ tablet Take 20 mEq by mouth 2 (two) times daily.     Historical Provider, MD  psyllium (METAMUCIL) 58.6 % powder Take 1 packet by mouth daily. At lunch or supper    Historical Provider, MD  ramipril (ALTACE) 10 MG capsule Take 1 capsule (10 mg total) by mouth daily. 09/27/14   Troy Sine, MD  warfarin (COUMADIN) 5 MG tablet Take 1 tablet by mouth daily or  as directed 07/28/14   Troy Sine, MD   BP 141/68 mmHg  Pulse 124  Temp(Src) 97.4 F (36.3 C) (Oral)  Resp 18  Ht 5\' 2"  (1.575 m)  Wt 130 lb (58.968 kg)  BMI 23.77 kg/m2  SpO2 88% Physical Exam  Constitutional: She appears well-developed and well-nourished. No distress.  HENT:  Head: Normocephalic and atraumatic.  Mouth/Throat: Oropharynx is clear and moist.  Eyes: Conjunctivae and EOM are normal. Right eye exhibits no discharge. Left eye exhibits no discharge.  Cardiovascular:  Irregularly irregular rhythm. No tachycardia my exam.  Pulmonary/Chest: Effort normal and breath sounds normal. No respiratory distress. She has no wheezes.  Abdominal: Soft. Bowel sounds are normal. She exhibits no distension.  Diffuse abdominal tenderness without rigidity, guarding, rebound.  Neurological: She is alert. She exhibits normal muscle tone. Coordination normal.  Skin: Skin is warm and dry. She is not diaphoretic.  Nursing note and vitals reviewed.   ED Course  Procedures (including critical care time) Labs Review Labs Reviewed  URINALYSIS, ROUTINE W REFLEX MICROSCOPIC - Abnormal; Notable for the following:    Leukocytes, UA TRACE (*)    All other components within normal limits  CBC WITH DIFFERENTIAL/PLATELET - Abnormal; Notable for the following:    WBC 15.4 (*)    Neutrophils Relative % 84 (*)    Neutro Abs 12.8 (*)    Lymphocytes Relative 7 (*)    Monocytes Absolute 1.4 (*)    All other components within normal limits  COMPREHENSIVE METABOLIC PANEL - Abnormal; Notable for the following:    Sodium 131 (*)    Chloride 98 (*)    Glucose, Bld 175 (*)    Calcium 8.2 (*)    Albumin 2.9 (*)    AST 14 (*)    ALT 10 (*)    All other components within normal limits  URINE MICROSCOPIC-ADD ON - Abnormal; Notable for the following:    Casts HYALINE CASTS (*)    All other components within normal limits  LIPASE, BLOOD  TROPONIN I    Imaging Review Ct Abdomen Pelvis W  Contrast  12/06/2014   CLINICAL DATA:  Nausea  and mid abdominal pain for 2 days with constipation for 1 week.  EXAM: CT ABDOMEN AND PELVIS WITH CONTRAST  TECHNIQUE: Multidetector CT imaging of the abdomen and pelvis was performed using the standard protocol following bolus administration of intravenous contrast.  CONTRAST:  161mL OMNIPAQUE IOHEXOL 300 MG/ML  SOLN  COMPARISON:  08/07/2009.  Chest CT of 11/09/2014  FINDINGS: Lower chest: Centrilobular emphysema. Incompletely imaged left lower lobe opacity which could represent postobstructive pneumonitis. The left lower lobe lung mass is only minimally included. Moderate cardiomegaly. Small left pleural effusion is new.  Hepatobiliary: Normal liver. Cholecystectomy, without biliary ductal dilatation.  Pancreas: Probable pancreas divisum, with a prominent dorsal duct entering the duodenum on image 33. No acute pancreatitis.  Spleen: Normal  Adrenals/Urinary Tract: Bilateral adrenal thickening and nodularity is similar. Bilateral renal cysts and too small to characterize lesions. No hydronephrosis. Normal urinary bladder.  Stomach/Bowel: Normal stomach, without wall thickening. Normal colon and terminal ileum. Normal small bowel.  Vascular/Lymphatic: Aortic and branch vessel atherosclerosis. No abdominopelvic adenopathy.  Reproductive: Pelvic floor laxity. Otherwise normal appearance of the uterus. A left adnexal/ovarian cystic lesion measures 5.7 cm and fluid density. This was present on 08/07/2009.  Other: No significant free fluid.  Musculoskeletal: Lumbosacral spondylosis  IMPRESSION: 1.  No acute process in the abdomen or pelvis. 2. Left ovarian/adnexal cystic lesion which warrants further evaluation with nonemergent pelvic ultrasound. 3. New left pleural effusions; left base airspace disease which could represent postobstructive pneumonitis. Please see chest CT of 11/09/2014 for description of left lower lobe lung mass. 4. Findings suspicious for pancreas divisum  ; no evidence of acute pancreatitis. 5. Bilateral adrenal nodularity is similar back to the 08/07/2009, suggesting a benign etiology. 6. Pelvic floor laxity.   Electronically Signed   By: Abigail Miyamoto M.D.   On: 12/06/2014 13:49     EKG Interpretation   Date/Time:  Monday Dec 06 2014 12:17:20 EDT Ventricular Rate:  93 PR Interval:    QRS Duration: 78 QT Interval:  366 QTC Calculation: 455 R Axis:   -35 Text Interpretation:  Atrial fibrillation with premature ventricular or  aberrantly conducted complexes Left axis deviation Nonspecific findings  Abnormal ECG Confirmed by ZAVITZ  MD, JOSHUA ([id number]) on 12/06/2014 12:21:52  PM      MDM   Final diagnoses:  Bilateral lower abdominal pain  Nausea   Patient presenting with 2 day history of bilateral lower abdominal pain with associated nausea. Pt with abdominal tenderness without evidence of peritonitis. Lab workup with leukocytosis of 15.4, mild LFT elevation and hypokalemia and hypochloremia. Patient given fluids. CT abdomen without acute abdominal abnormality. I doubt acute abdominal pelvic process. Pt tolerating fluids in ED. Patient given morphine and denies any pain on reassessment. Pt with left ovarian cystic lesion and [facility]. Pt in no acute distress and requesting to leave.   Pt with elevated HR in 120s with history of atrial fibrilation on warfarin and denies CP or SOB. Pt also with hypoxia on home oxygen with history of COPD and lung cancer. Pt states this is normal and baseline. EKG with atrial fibrillation and negative troponin. Pt states she has been dealing with this problem for years followed by cardiology. Pt refusing any HTN/HR medications. Pt stable and I feel patient can be managed on outpatient basis with close follow up with cardiology. Pt and family member who is bedside verbalizes agreement.   Discussed return precautions with patient. Discussed all results and  patient verbalizes  understanding and agrees with plan.  This is a shared patient. This patient was discussed with the physician who saw and evaluated the patient and agrees with the plan.   Al Corpus, PA-C 12/06/14 1519  Elnora Morrison, MD 12/08/14 [phone]

## 2014-12-06 NOTE — Discharge Instructions (Signed)
Return to the emergency room with worsening of symptoms, new symptoms or with symptoms that are concerning , especially fevers, abdominal pain in one area, unable to keep down fluids, blood in stool or vomit, severe pain, you feel faint, lightheaded or pass out. Please call your doctor for a followup appointment within 24-48 hours. When you talk to your doctor please let them know that you were seen in the emergency department and have them acquire all of your records so that they can discuss the findings with you and formulate a treatment plan to fully care for your new and ongoing problems.  Zofran for nausea.  Take your home hydrocodone on for abdominal pain for the next couple days and then resume normal home regiment. Start taking Metamucil daily with her morning breakfast drink. You may continue to take your stool softener. You may also consider taking Miralax (Miralax: 3 capfuls in large Gatorade over the course of the day when stool becomes loose decrease to one capful a day for 1 week.) Read below information and follow recommendations. Abdominal Pain Many things can cause abdominal pain. Usually, abdominal pain is not caused by a disease and will improve without treatment. It can often be observed and treated at home. Your health care provider will do a physical exam and possibly order blood tests and X-rays to help determine the seriousness of your pain. However, in many cases, more time must pass before a clear cause of the pain can be found. Before that point, your health care provider may not know if you need more testing or further treatment. HOME CARE INSTRUCTIONS  Monitor your abdominal pain for any changes. The following actions may help to alleviate any discomfort you are experiencing:  Only take over-the-counter or prescription medicines as directed by your health care provider.  Do not take laxatives unless directed to do so by your health care provider.  Try a clear liquid diet  (broth, tea, or water) as directed by your health care provider. Slowly move to a bland diet as tolerated. SEEK MEDICAL CARE IF:  You have unexplained abdominal pain.  You have abdominal pain associated with nausea or diarrhea.  You have pain when you urinate or have a bowel movement.  You experience abdominal pain that wakes you in the night.  You have abdominal pain that is worsened or improved by eating food.  You have abdominal pain that is worsened with eating fatty foods.  You have a fever. SEEK IMMEDIATE MEDICAL CARE IF:   Your pain does not go away within 2 hours.  You keep throwing up (vomiting).  Your pain is felt only in portions of the abdomen, such as the right side or the left lower portion of the abdomen.  You pass bloody or black tarry stools. MAKE SURE YOU:  Understand these instructions.   Will watch your condition.   Will get help right away if you are not doing well or get worse.  Document Released: 05/02/2005 Document Revised: 07/28/2013 Document Reviewed: 04/01/2013 [facility] Patient Information 2015 [place], [place]. This information is not intended to replace advice given to you by your health care provider. Make sure you discuss any questions you have with your health care provider.

## 2014-12-06 NOTE — ED Notes (Signed)
Pt to room 4 in w/c, reports abd "aching" to mid abd. Pt states she took a laxative last night and had a "small" bm this am. Pt states prior to this am her last good bm was a week ago. Pt denies any diarrhea or vomiting, some nausea this am.

## 2014-12-16 ENCOUNTER — Ambulatory Visit (INDEPENDENT_AMBULATORY_CARE_PROVIDER_SITE_OTHER): Admitting: Pharmacist Clinician (PhC)/ Clinical Pharmacy Specialist

## 2014-12-16 DIAGNOSIS — I4891 Unspecified atrial fibrillation: Secondary | ICD-10-CM

## 2014-12-16 DIAGNOSIS — Z7901 Long term (current) use of anticoagulants: Secondary | ICD-10-CM | POA: Diagnosis not present

## 2014-12-16 LAB — POCT INR: INR: 2.5

## 2014-12-22 ENCOUNTER — Other Ambulatory Visit: Payer: Self-pay | Admitting: *Deleted

## 2014-12-22 MED ORDER — ISOSORBIDE MONONITRATE ER 30 MG PO TB24: 30.0000 mg | ORAL_TABLET | Freq: Every day | ORAL | Status: AC

## 2014-12-22 NOTE — Telephone Encounter (Signed)
Refilled patient's isosorbide MN 30mg  [facility].

## 2015-01-10 ENCOUNTER — Telehealth: Payer: Self-pay | Admitting: Cardiovascular Disease

## 2015-01-10 NOTE — Telephone Encounter (Signed)
Called hospice nurse back. States expected progression - no acute concerns - but patient has become weaker and harder to get her out to appts. Wanted verbal OK for home INR checks - advised OK. She will send a faxed request for signature.  Will route to PharmD.

## 2015-01-10 NOTE — Telephone Encounter (Signed)
Constance Holster called in stating that the pt has grown weaker and it would be hard for the pt to come in and have her INR checked. So Constance Holster says that the INR can be checked at the pt's home she just needs a verbal ok from her doctor first. Please call  Thanks

## 2015-01-10 NOTE — Telephone Encounter (Signed)
Noted, will watch for paperwork

## 2015-01-11 ENCOUNTER — Ambulatory Visit (INDEPENDENT_AMBULATORY_CARE_PROVIDER_SITE_OTHER): Payer: Medicare Other | Admitting: Pharmacist Clinician (PhC)/ Clinical Pharmacy Specialist

## 2015-01-11 DIAGNOSIS — Z7901 Long term (current) use of anticoagulants: Secondary | ICD-10-CM

## 2015-01-11 DIAGNOSIS — I4891 Unspecified atrial fibrillation: Secondary | ICD-10-CM

## 2015-01-11 LAB — POCT INR: INR: 2.1

## 2015-02-09 ENCOUNTER — Ambulatory Visit (INDEPENDENT_AMBULATORY_CARE_PROVIDER_SITE_OTHER): Payer: Medicare Other | Admitting: Pharmacist Clinician (PhC)/ Clinical Pharmacy Specialist

## 2015-02-09 DIAGNOSIS — I4891 Unspecified atrial fibrillation: Secondary | ICD-10-CM

## 2015-02-09 DIAGNOSIS — Z7901 Long term (current) use of anticoagulants: Secondary | ICD-10-CM

## 2015-02-09 LAB — POCT INR: INR: 3

## 2015-03-09 ENCOUNTER — Ambulatory Visit (INDEPENDENT_AMBULATORY_CARE_PROVIDER_SITE_OTHER): Payer: Medicare Other | Admitting: Pharmacist Clinician (PhC)/ Clinical Pharmacy Specialist

## 2015-03-09 DIAGNOSIS — I4891 Unspecified atrial fibrillation: Secondary | ICD-10-CM

## 2015-03-09 DIAGNOSIS — Z7901 Long term (current) use of anticoagulants: Secondary | ICD-10-CM

## 2015-03-09 LAB — POCT INR: INR: 2.9

## 2015-03-25 ENCOUNTER — Telehealth: Payer: Self-pay | Admitting: Cardiovascular Disease

## 2015-03-25 NOTE — Telephone Encounter (Signed)
[name] from hospice explains that this patient has been in their care since May 2016 w/ terminal dx - lung CA w/ related complications.  Generally symptom management OK'd by attending, Dr. Suzanna Obey, but she explains Dr. Doreene Nest did not want to sign off on heart related med changes (i.e. Digoxin).  Dr. Hale Bogus, hospice physician, signed for recent dose change to patient's digoxin,   [name] was calling to make sure Dr. Claiborne Billings was OK w/ this. She noted pt too sick to come to office for appts. Advised this should be fine, would send to Dr. Claiborne Billings for any concerns/for his information.

## 2015-03-28 NOTE — Telephone Encounter (Signed)
She was on dig to help with rate control of her AF

## 2015-04-05 ENCOUNTER — Ambulatory Visit (INDEPENDENT_AMBULATORY_CARE_PROVIDER_SITE_OTHER): Payer: Medicare Other | Admitting: Pharmacist Clinician (PhC)/ Clinical Pharmacy Specialist

## 2015-04-05 DIAGNOSIS — Z7901 Long term (current) use of anticoagulants: Secondary | ICD-10-CM

## 2015-04-05 DIAGNOSIS — I4891 Unspecified atrial fibrillation: Secondary | ICD-10-CM

## 2015-04-05 LAB — POCT INR: INR: 4.1

## 2015-04-13 ENCOUNTER — Ambulatory Visit (INDEPENDENT_AMBULATORY_CARE_PROVIDER_SITE_OTHER): Payer: Medicare Other | Admitting: Pharmacist Clinician (PhC)/ Clinical Pharmacy Specialist

## 2015-04-13 DIAGNOSIS — Z7901 Long term (current) use of anticoagulants: Secondary | ICD-10-CM

## 2015-04-13 DIAGNOSIS — I4891 Unspecified atrial fibrillation: Secondary | ICD-10-CM

## 2015-04-13 LAB — POCT INR: INR: 2.9

## 2015-04-22 ENCOUNTER — Ambulatory Visit: Payer: Medicare Other | Admitting: Cardiovascular Disease

## 2015-04-22 ENCOUNTER — Ambulatory Visit (INDEPENDENT_AMBULATORY_CARE_PROVIDER_SITE_OTHER): Payer: Medicare Other | Admitting: Pharmacist Clinician (PhC)/ Clinical Pharmacy Specialist

## 2015-04-22 DIAGNOSIS — Z7901 Long term (current) use of anticoagulants: Secondary | ICD-10-CM

## 2015-04-22 DIAGNOSIS — I4891 Unspecified atrial fibrillation: Secondary | ICD-10-CM

## 2015-04-22 LAB — POCT INR: INR: 4.3

## 2015-04-25 ENCOUNTER — Ambulatory Visit: Payer: Self-pay | Admitting: Pharmacist Clinician (PhC)/ Clinical Pharmacy Specialist

## 2015-04-25 DIAGNOSIS — I4891 Unspecified atrial fibrillation: Secondary | ICD-10-CM

## 2015-04-25 DIAGNOSIS — Z7901 Long term (current) use of anticoagulants: Secondary | ICD-10-CM

## 2015-05-07 DEATH — deceased
